# Patient Record
Sex: Female | Born: 1940 | Race: White | Hispanic: No | Marital: Married | Smoking: Former smoker
Health system: Southern US, Community
[De-identification: ages and names within clinical notes are randomized; demographics above are authoritative.]

## PROBLEM LIST (undated history)

## (undated) DIAGNOSIS — F419 Anxiety disorder, unspecified: Secondary | ICD-10-CM

## (undated) DIAGNOSIS — E042 Nontoxic multinodular goiter: Secondary | ICD-10-CM

## (undated) DIAGNOSIS — J45909 Unspecified asthma, uncomplicated: Secondary | ICD-10-CM

## (undated) DIAGNOSIS — R519 Headache, unspecified: Secondary | ICD-10-CM

## (undated) DIAGNOSIS — M1990 Unspecified osteoarthritis, unspecified site: Secondary | ICD-10-CM

## (undated) DIAGNOSIS — F329 Major depressive disorder, single episode, unspecified: Secondary | ICD-10-CM

## (undated) DIAGNOSIS — K639 Disease of intestine, unspecified: Secondary | ICD-10-CM

## (undated) DIAGNOSIS — I714 Abdominal aortic aneurysm, without rupture, unspecified (CMS HCC): Secondary | ICD-10-CM

## (undated) DIAGNOSIS — I728 Aneurysm of other specified arteries: Secondary | ICD-10-CM

## (undated) DIAGNOSIS — M797 Fibromyalgia: Secondary | ICD-10-CM

## (undated) DIAGNOSIS — R150 Incomplete defecation: Secondary | ICD-10-CM

## (undated) DIAGNOSIS — K5900 Constipation, unspecified: Secondary | ICD-10-CM

## (undated) DIAGNOSIS — F32A Depression, unspecified: Secondary | ICD-10-CM

## (undated) DIAGNOSIS — N816 Rectocele: Secondary | ICD-10-CM

## (undated) DIAGNOSIS — M549 Dorsalgia, unspecified: Secondary | ICD-10-CM

## (undated) HISTORY — DX: Anxiety disorder, unspecified: F41.9

## (undated) HISTORY — DX: Nontoxic multinodular goiter: E04.2

## (undated) HISTORY — DX: Constipation, unspecified: K59.00

## (undated) HISTORY — DX: Dorsalgia, unspecified: M54.9

## (undated) HISTORY — DX: Aneurysm of other specified arteries (CMS HCC): I72.8

## (undated) HISTORY — DX: Abdominal aortic aneurysm, without rupture, unspecified (CMS HCC): I71.40

## (undated) HISTORY — DX: Fibromyalgia: M79.7

## (undated) HISTORY — DX: Disease of intestine, unspecified: K63.9

## (undated) HISTORY — PX: HX WISDOM TEETH EXTRACTION: SHX21

## (undated) HISTORY — PX: HX COLONOSCOPY: 2100001147

## (undated) HISTORY — DX: Headache, unspecified: R51.9

## (undated) HISTORY — PX: HX GALL BLADDER SURGERY/CHOLE: SHX55

## (undated) HISTORY — DX: Depression, unspecified: F32.A

## (undated) HISTORY — PX: HX APPENDECTOMY: SHX54

## (undated) HISTORY — DX: Incomplete defecation: R15.0

## (undated) HISTORY — DX: Abdominal aortic aneurysm, without rupture (CMS HCC): I71.4

## (undated) HISTORY — DX: Rectocele: N81.6

## (undated) HISTORY — DX: Unspecified osteoarthritis, unspecified site: M19.90

---
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
## 2006-10-11 ENCOUNTER — Ambulatory Visit (HOSPITAL_COMMUNITY): Payer: Self-pay

## 2010-02-01 HISTORY — PX: HX LAP CHOLECYSTECTOMY: SHX56

## 2012-02-02 HISTORY — PX: HX SINUS SURGERY: 2100001108

## 2013-07-25 ENCOUNTER — Ambulatory Visit (INDEPENDENT_AMBULATORY_CARE_PROVIDER_SITE_OTHER): Payer: Self-pay | Admitting: Physician Assistant

## 2013-07-25 ENCOUNTER — Ambulatory Visit (INDEPENDENT_AMBULATORY_CARE_PROVIDER_SITE_OTHER): Payer: HMO | Admitting: Physician Assistant

## 2013-07-25 ENCOUNTER — Encounter (INDEPENDENT_AMBULATORY_CARE_PROVIDER_SITE_OTHER): Payer: Self-pay | Admitting: Physician Assistant

## 2013-07-25 VITALS — BP 120/70 | HR 81 | Temp 98.2°F | Resp 16 | Ht 61.0 in | Wt 140.0 lb

## 2013-07-25 DIAGNOSIS — M25559 Pain in unspecified hip: Secondary | ICD-10-CM

## 2013-07-25 DIAGNOSIS — M533 Sacrococcygeal disorders, not elsewhere classified: Secondary | ICD-10-CM | POA: Insufficient documentation

## 2013-07-25 NOTE — Patient Instructions (Signed)

## 2013-07-25 NOTE — H&P (Addendum)
HISTORY OF PRESENT ILLNESS:  This is a 73 y.o.  right handed female presenting with 6 mos history of right hip pain. No low back pain or leg pain.  No left hip pain.  Pain is described as sharp .  Symptoms are present off and on and seem worse from sitting to standing.  They improve with sitting and lying down.  Patient does not have bowel incontinence. She does not have bladder incontinence.  Patient has tried physical therapy.  Patient has not tried pain clinic injections.  Patient has not tried chiropractic treatment.  She  has not had a prior spine fracture.  She has not had prior spine surgery. She had a recent lumbar MRI study but did not have with her today.    PAST MEDICAL HISTORY:  has a past medical history of Fibromyalgia and Chronic headaches.  MEDICATIONS:  Current Outpatient Prescriptions   Medication Sig    Ca-D3-mag ox-zinc-cop-mang-bor (CALCIUM 600+D3 PLUS) 600 mg calcium- 800 unit-50 mg Oral Tablet Take by mouth    citalopram (CELEXA) 20 mg Oral Tablet Take 20 mg by mouth Once a day    Meloxicam (MOBIC) 15 mg Oral Tablet Take 15 mg by mouth Once a day       ALLERGIES:  has no allergies on file.    PAST SURGICAL HISTORY:   has past surgical history that includes sinus surgery (2014) and lap cholecystectomy (2012).      FAMILY HISTORY:  family history includes Heart Attack (age of onset: 2658) in her sister; Heart Attack (age of onset: 4368) in her father; Hypertension (age of onset: 2266) in her mother.    SOCIAL HISTORY:  reports that she has never smoked. She does not have any smokeless tobacco history on file. She reports that she does not drink alcohol or use illicit drugs.    REVIEW OF SYSTEMS:  Constitutional: negative, Eyes: negative, Ears, nose, mouth, throat, and face: negative, Respiratory: negative, Gastrointestinal: negative, Musculoskeletal:negative except for HPI, Neurological: negative except for HPI and Behavioral/Psych: negative except for HPI    PHYSICAL EXAMINATION:  On examination  today, the patient is in no apparent distress.  BP 120/70    Pulse 81    Temp(Src) 36.8 C (98.2 F) (Tympanic)    Resp 16    Ht 1.549 m (5\' 1" )    Wt 63.504 kg (140 lb)    BMI 26.47 kg/m2      SpO2 98%   Skin is warm and pink. Gait is steady without ataxia.  Motor, sensory, and cerebellar functions are intact.  No long tract findings.  Alert and oriented.  Language clear, coherent, and goal directed.  Cranial nerves 2-12 are intact. SLR is negative. Stiffness and point tenderness across the right hip.    ASSESSMENT:    Patient Active Problem List   Diagnosis    Hip pain    Sacroiliac pain       PLAN:  Right SI injection.   RTC to evaluate.   Repeat vs LESI if indicated after reviewing her recent L MRI on her next visit.   She was advised to bring her most recent L MRi on her next visit.       The patient was seen independently.  Arloa KohRolando Garcia, PA-C 07/25/2013, 12:47  [place] Department of Neurosurgery

## 2013-08-15 ENCOUNTER — Ambulatory Visit (HOSPITAL_BASED_OUTPATIENT_CLINIC_OR_DEPARTMENT_OTHER): Payer: HMO | Admitting: Anesthesiology

## 2013-08-15 DIAGNOSIS — M533 Sacrococcygeal disorders, not elsewhere classified: Secondary | ICD-10-CM

## 2013-09-05 ENCOUNTER — Ambulatory Visit (INDEPENDENT_AMBULATORY_CARE_PROVIDER_SITE_OTHER): Payer: HMO | Admitting: Physician Assistant

## 2013-09-05 ENCOUNTER — Encounter (INDEPENDENT_AMBULATORY_CARE_PROVIDER_SITE_OTHER): Payer: Self-pay | Admitting: Physician Assistant

## 2013-09-05 VITALS — BP 118/60 | HR 80 | Temp 98.0°F | Resp 16 | Ht 61.0 in | Wt 140.0 lb

## 2013-09-05 DIAGNOSIS — M25559 Pain in unspecified hip: Secondary | ICD-10-CM

## 2013-09-05 DIAGNOSIS — M25551 Pain in right hip: Secondary | ICD-10-CM

## 2013-09-05 DIAGNOSIS — M533 Sacrococcygeal disorders, not elsewhere classified: Secondary | ICD-10-CM

## 2013-09-05 NOTE — Patient Instructions (Signed)

## 2013-09-05 NOTE — Progress Notes (Addendum)
S- This is a 73 y.o. year old female with history of right sided low back and right hip pain . 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 She underwent a Right SI injection. . She claims 0% relief to date. She did have a burning sensation on her face and jaw after the injection, lasting 3 days. The patient denies any myelopathy. The patient denies any complications on the injection site. The patient denies any bladder or bowel changes. The patient denies any fevers.  O- On examination, She  is no apparent distress. BP 118/60 mmHg   Pulse 80   Temp(Src) 36.7 C (98 F) (Tympanic)   Resp 16   Ht 1.549 m (5\' 1" )   Wt 63.504 kg (140 lb)   BMI 26.47 kg/m2   SpO2 97%. Injection site is intact, no ereythema, swelling, drainage, induration, warmth or signs of infection. Skin is warm and pink. Gait is steady, no ataxia. Motor and sensory funtions are intact. DTR's are intact and symmetric. Cranial Nerve 2-12 are intact to specific testings. SLR is negative bilaterally. Point tenderness to right hip area.  Assessment:   Patient Active Problem List   Diagnosis    Hip pain    Sacroiliac pain     Plan: 1. L5-S1 LESI (aiming right).  RTC to evaluate.  2. Walking exercises as tolerated  3. PT as needed    The patient was seen independently.  Arloa KohRolando Garcia, PA-C 09/05/2013, 11:30  [facility] Department of Neurosurgery   and Pain Management

## 2013-09-26 ENCOUNTER — Ambulatory Visit (HOSPITAL_BASED_OUTPATIENT_CLINIC_OR_DEPARTMENT_OTHER): Payer: HMO | Admitting: Anesthesiology

## 2013-09-26 DIAGNOSIS — M533 Sacrococcygeal disorders, not elsewhere classified: Secondary | ICD-10-CM

## 2013-09-26 DIAGNOSIS — IMO0001 Reserved for inherently not codable concepts without codable children: Secondary | ICD-10-CM

## 2013-09-26 DIAGNOSIS — M5137 Other intervertebral disc degeneration, lumbosacral region: Secondary | ICD-10-CM

## 2013-10-23 ENCOUNTER — Encounter (INDEPENDENT_AMBULATORY_CARE_PROVIDER_SITE_OTHER): Payer: Self-pay | Admitting: Physician Assistant

## 2015-11-27 ENCOUNTER — Other Ambulatory Visit: Payer: Self-pay | Admitting: Internal Medicine

## 2015-11-27 ENCOUNTER — Ambulatory Visit
Admission: RE | Admit: 2015-11-27 | Discharge: 2015-11-27 | Disposition: A | Discharge status: Home / Self Care | Payer: Self-pay | Source: Ambulatory Visit | Attending: Internal Medicine | Admitting: Internal Medicine

## 2015-11-27 DIAGNOSIS — E041 Nontoxic single thyroid nodule: Secondary | ICD-10-CM

## 2015-12-09 ENCOUNTER — Other Ambulatory Visit (HOSPITAL_COMMUNITY)
Admission: RE | Admit: 2015-12-09 | Discharge: 2015-12-09 | Disposition: A | Discharge status: Home / Self Care | Payer: Medicare Other | Source: Ambulatory Visit | Attending: General Surgery | Admitting: General Surgery

## 2015-12-09 ENCOUNTER — Ambulatory Visit
Admission: RE | Admit: 2015-12-09 | Discharge: 2015-12-09 | Disposition: A | Discharge status: Home / Self Care | Payer: Self-pay | Source: Ambulatory Visit | Attending: Internal Medicine | Admitting: Internal Medicine

## 2015-12-09 DIAGNOSIS — E041 Nontoxic single thyroid nodule: Secondary | ICD-10-CM | POA: Insufficient documentation

## 2016-01-02 ENCOUNTER — Ambulatory Visit (INDEPENDENT_AMBULATORY_CARE_PROVIDER_SITE_OTHER): Payer: Medicare Other | Admitting: Endocrinology

## 2016-01-02 ENCOUNTER — Encounter: Payer: Self-pay | Admitting: Endocrinology

## 2016-01-02 DIAGNOSIS — E042 Nontoxic multinodular goiter: Secondary | ICD-10-CM | POA: Insufficient documentation

## 2016-01-02 DIAGNOSIS — F419 Anxiety disorder, unspecified: Secondary | ICD-10-CM | POA: Diagnosis not present

## 2016-01-02 LAB — T4, FREE: FREE T4: 0.93 ng/dL (ref 0.60–1.60)

## 2016-01-02 LAB — TSH: TSH: 1.12 u[IU]/mL (ref 0.35–4.50)

## 2016-01-02 NOTE — Progress Notes (Signed)
Subjective:    Patient ID: Erin Perry, female    DOB: 01/13/1941, 75 y.o.   MRN: [id number]  HPI Pt is referred by Dr Ebbie LatusSchoenoff, for nodular thyroid.  5 mos ago, pt had carotid US.  She was incidentally was noted to have nodules in the thyroid.  she is unaware of ever having had thyroid problems in the past.  she has no h/o XRT or surgery to the neck.  She has slightly easy bruising, and assoc weight gain.   Past Medical History:  Diagnosis Date  . Multinodular goiter     No past surgical history on file.  Social History   Social History  . Marital status: Married    Spouse name: N/A  . Number of children: N/A  . Years of education: N/A   Occupational History  . Not on file.   Social History Main Topics  . Smoking status: Former Games developermoker  . Smokeless tobacco: Never Used  . Alcohol use Yes  . Drug use: Unknown  . Sexual activity: Not on file   Other Topics Concern  . Not on file   Social History Narrative  . No narrative on file    No current outpatient prescriptions on file prior to visit.   No current facility-administered medications on file prior to visit.     Allergies  Allergen Reactions  . Ceftin  [Cefuroxime Axetil] Anaphylaxis  . Clarithromycin Anaphylaxis  . Talwin [Pentazocine] Anaphylaxis  . Amoxicillin   . Gabapentin   . Ibuprofen   . Iodinated Diagnostic Agents   . Naproxen   . Other     FLU SHOT   . Penicillins   . Sulfa Antibiotics   . Tequin [Gatifloxacin]   . Zoloft  [Sertraline Hcl]     Family History  Problem Relation Age of Onset  . Thyroid disease Neg Hx     BP 122/84   Pulse 86   Ht 5\' 5"  (1.651 m)   Wt 139 lb (63 kg)   SpO2 95%   BMI 23.13 kg/m    Review of Systems Denies hoarseness, neck pain, visual loss, chest pain, cough, dysphagia, diarrhea, itching, flushing, depression, cold intolerance, headache, numbness, and rhinorrhea.       Objective:   Physical Exam VS: see vs page GEN: no distress HEAD: head: no  deformity eyes: no periorbital swelling, no proptosis external nose and ears are normal mouth: no lesion seen NECK: supple, thyroid is not enlarged.  The nodules are not palpable.  CHEST WALL: no deformity LUNGS: clear to auscultation CV: reg rate and rhythm, no murmur ABD: abdomen is soft, nontender.  no hepatosplenomegaly.  not distended.  no hernia MUSCULOSKELETAL: muscle bulk and strength are grossly normal.  no obvious joint swelling.  gait is normal and steady EXTEMITIES: no deformity.  no ulcer on the feet.  feet are of normal color and temp.  no edema PULSES: dorsalis pedis intact bilat.  no carotid bruit NEURO:  cn 2-12 grossly intact.   readily moves all 4's.  sensation is intact to touch on the feet SKIN:  Normal texture and temperature.  No rash or suspicious lesion is visible.   NODES:  None palpable at the neck. PSYCH: alert, well-oriented.  Does not appear anxious nor depressed.  US: Thyroid ultrasound. Right upper pole complex nodule measures 1.5 x 1.1 x 1.3 cm. Right lower pole cystic nodule measures 0.5 x 0.3 x 1.3 cm. Left lower pole hypoechoic solid nodule measures 1.1 x 2.5  x 0.6 cm.  outside test results are reviewed: TSH=1.1  Cytol: BENIGN FOLLICULAR NODULE (BETHESDA CATEGORY II).  I have reviewed outside records, and summarized: Pt was noted to have goiter, and ref here.  She was noted at [facility] to have eye sxs, but no findings of Grave's Dz were found.     Assessment & Plan:  Multinodular goiter, ne to me. Euthyroid.   Weight gain: I told pt this is not thyroid-related  No rx needed now. Please come back for a follow-up appointment in 6-12 months

## 2016-01-02 NOTE — Patient Instructions (Signed)
blood tests are requested for you today.  We'll let you know about the results. Please come back for a follow-up appointment in 6-12 months, when we'll plan to recheck the ultrasound.

## 2016-01-03 ENCOUNTER — Encounter: Payer: Self-pay | Admitting: Endocrinology

## 2016-01-03 DIAGNOSIS — F419 Anxiety disorder, unspecified: Secondary | ICD-10-CM | POA: Insufficient documentation

## 2016-01-05 ENCOUNTER — Telehealth: Payer: Self-pay | Admitting: Endocrinology

## 2016-01-05 NOTE — Telephone Encounter (Signed)
Patient is calling for the result of labs °

## 2016-01-06 NOTE — Telephone Encounter (Signed)
I contacted the patient and advised via voicemail results from 01/02/2016 were normal.  Patient advised to call back to discuss further if needed.

## 2016-07-02 ENCOUNTER — Ambulatory Visit (INDEPENDENT_AMBULATORY_CARE_PROVIDER_SITE_OTHER): Payer: Medicare Other | Admitting: Endocrinology

## 2016-07-02 ENCOUNTER — Encounter: Payer: Self-pay | Admitting: Endocrinology

## 2016-07-02 ENCOUNTER — Other Ambulatory Visit (INDEPENDENT_AMBULATORY_CARE_PROVIDER_SITE_OTHER): Payer: Medicare Other

## 2016-07-02 VITALS — BP 130/78 | HR 74 | Ht 65.0 in | Wt 139.0 lb

## 2016-07-02 DIAGNOSIS — E042 Nontoxic multinodular goiter: Secondary | ICD-10-CM

## 2016-07-02 LAB — T4, FREE: Free T4: 0.94 ng/dL (ref 0.60–1.60)

## 2016-07-02 LAB — T3, FREE: T3, Free: 2.9 pg/mL (ref 2.3–4.2)

## 2016-07-02 LAB — TSH: TSH: 1.14 u[IU]/mL (ref 0.35–4.50)

## 2016-07-02 NOTE — Patient Instructions (Addendum)
Let's recheck the ultrasound.  you will receive a phone call, about a day and time for an appointment. Thyroid blood tests are requested for you today.  We'll let you know about the results. Please return in 1 year.

## 2016-07-02 NOTE — Progress Notes (Signed)
   Subjective:    Patient ID: Erin Perry, female    DOB: 11/01/1940, 76 y.o.   MRN: [id number]  HPI Pt returns for f/u of multinodular goiter (dx'ed 2017, on carotid US; bx in 2017 showed BENIGN FOLLICULAR NODULE, CATEGORY II; she is euthyroid).  pt states she feels well in general, except for a few lbs of weight gain.  She does not notice the goiter Past Medical History:  Diagnosis Date  . Anxiety   . Multinodular goiter     No past surgical history on file.  Social History   Social History  . Marital status: Married    Spouse name: N/A  . Number of children: N/A  . Years of education: N/A   Occupational History  . Not on file.   Social History Main Topics  . Smoking status: Former Games developermoker  . Smokeless tobacco: Never Used  . Alcohol use Yes  . Drug use: Unknown  . Sexual activity: Not on file   Other Topics Concern  . Not on file   Social History Narrative  . No narrative on file    Current Outpatient Prescriptions on File Prior to Visit  Medication Sig Dispense Refill  . Multiple Vitamins-Minerals (MULTIVITAMIN ADULT PO) Take by mouth.    . busPIRone (BUSPAR) 5 MG tablet TAKE 1 TABLET(5 MG) BY MOUTH TWICE DAILY     No current facility-administered medications on file prior to visit.     Allergies  Allergen Reactions  . Ceftin  [Cefuroxime Axetil] Anaphylaxis  . Clarithromycin Anaphylaxis  . Talwin [Pentazocine] Anaphylaxis  . Amoxicillin   . Gabapentin   . Ibuprofen   . Iodinated Diagnostic Agents   . Naproxen   . Other     FLU SHOT   . Penicillins   . Sulfa Antibiotics   . Tequin [Gatifloxacin]   . Zoloft  [Sertraline Hcl]     Family History  Problem Relation Age of Onset  . Thyroid disease Neg Hx     BP 130/78   Pulse 74   Ht 5\' 5"  (1.651 m)   Wt 139 lb (63 kg)   SpO2 93%   BMI 23.13 kg/m   Review of Systems Denies neck pain    Objective:   Physical Exam VITAL SIGNS:  See vs page GENERAL: no distress NECK: supple, thyroid is not  enlarged.  The nodules are not palpable.       Assessment & Plan:  Multinodular goiter, non-palpable.  Due for recheck.    Patient Instructions  Let's recheck the ultrasound.  you will receive a phone call, about a day and time for an appointment. Thyroid blood tests are requested for you today.  We'll let you know about the results. Please return in 1 year.

## 2016-07-16 ENCOUNTER — Telehealth: Payer: Self-pay | Admitting: Endocrinology

## 2016-07-16 NOTE — Telephone Encounter (Signed)
Yes please, Thanks!  

## 2016-07-16 NOTE — Telephone Encounter (Signed)
So I need to [facility] and fax order to [facility], correct?

## 2016-07-16 NOTE — Telephone Encounter (Signed)
Got it, thank you!!

## 2016-07-16 NOTE — Telephone Encounter (Signed)
Please change the current US request to [facility].

## 2016-07-16 NOTE — Telephone Encounter (Signed)
Latisha with [facility] called and stated that there was a note on pt's US order that stated once US had been scheduled to notify Dr. Everardo AllEllison so that he can request her previous US thyroid from [facility]. Pt has been scheduled for 6/25 @ 2:30 PM. Debbe OdeaLatisha said you can call her if need be, thanks!

## 2016-07-16 NOTE — Telephone Encounter (Signed)
See message to be advised.  

## 2016-07-19 NOTE — Telephone Encounter (Signed)
Appt for US with GI has been canceled, order has been faxed to [facility], they will contact pt to schedule

## 2016-07-26 ENCOUNTER — Other Ambulatory Visit: Payer: Medicare Other

## 2016-08-10 ENCOUNTER — Telehealth: Payer: Self-pay | Admitting: Endocrinology

## 2016-08-10 NOTE — Telephone Encounter (Signed)
Patient notified via husband labs have been mailed.

## 2016-08-10 NOTE — Telephone Encounter (Signed)
Patient is requesting to have her lab results mailed to her address on file. The labs she had done 07/02/16. Notify patient once this has been done.

## 2016-08-13 NOTE — Telephone Encounter (Signed)
Letter printed, pending patient's pick up.

## 2016-08-13 NOTE — Telephone Encounter (Signed)
Patient called upset that she has not received her labs through the mail. I advised the patient that her husband was notified by Aundra MilletMegan B on 7/10 that the office has mailed it. I also told the patient that just like regular mail someone may put in the mailbox, once Endocrinology mails it, it is the postal service who is then in control of that mail. I ensured the patient that Endocrinology did their part to mail it to her th 10th. Patient stated she would come on Monday to pick it up.   Please go ahead and print the labs she is requesting and have ready to pick up for Monday in the front office.

## 2016-12-24 IMAGING — US US THYROID BIOPSY
1 series · 13 of 15 positions shown · non-contrast
Comparison: Ultrasound from outside facility that was uploaded on
November 27, 2015

INDICATION: Right thyroid nodule found on ultrasound from an outside facility.
Request is made for a thyroid biopsy.

EXAM:
ULTRASOUND GUIDED NEEDLE ASPIRATE BIOPSY OF THE RIGHT THYROID GLAND

[Series 1: us thyroid biopsy · 0.06mm/px · 15 acquisitions, 13 frames shown]
[im 1/15]
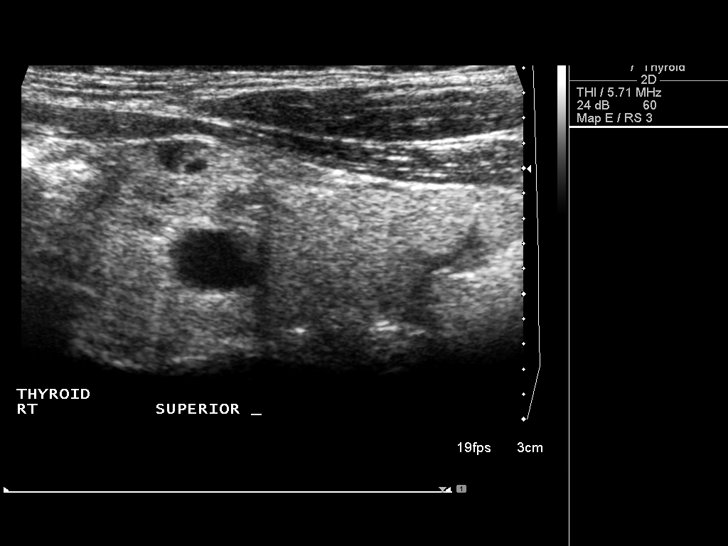
[im 2/15]
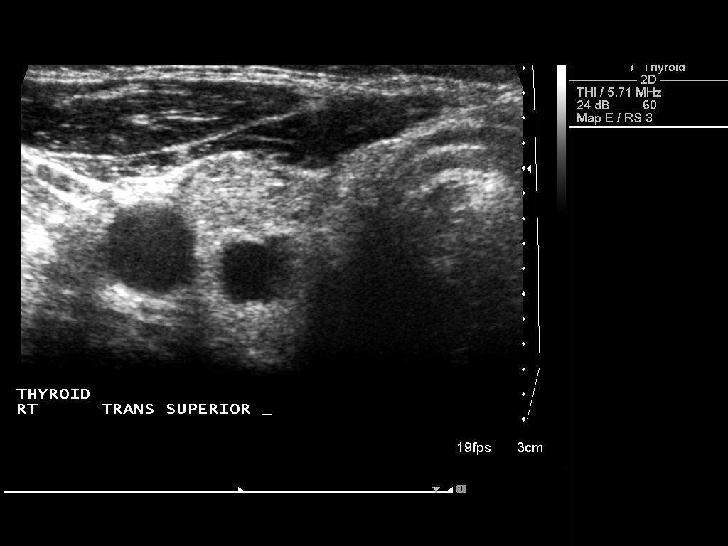
[im 3/15]
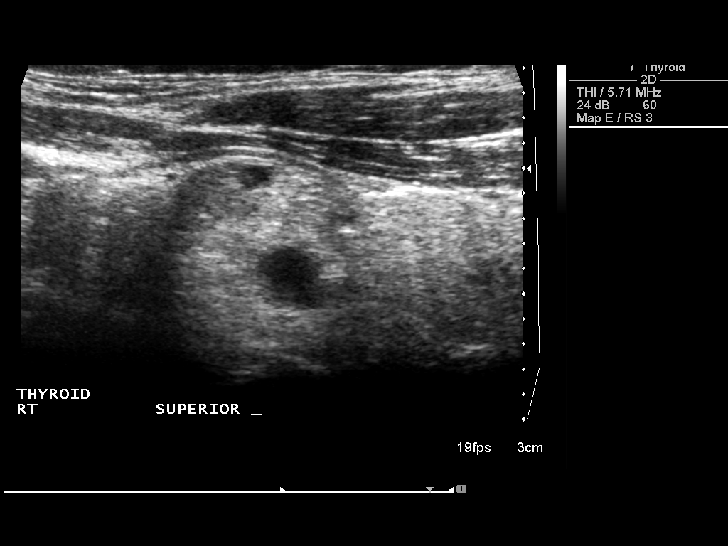
[im 5/15]
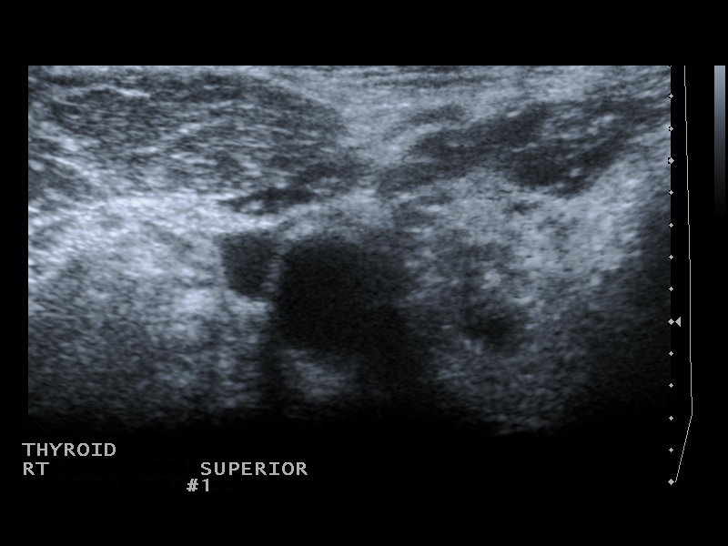
[im 6/15]
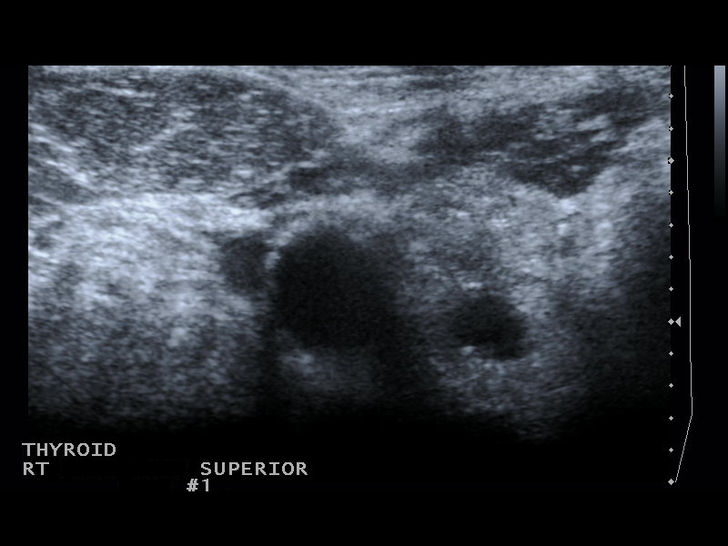
[im 7/15]
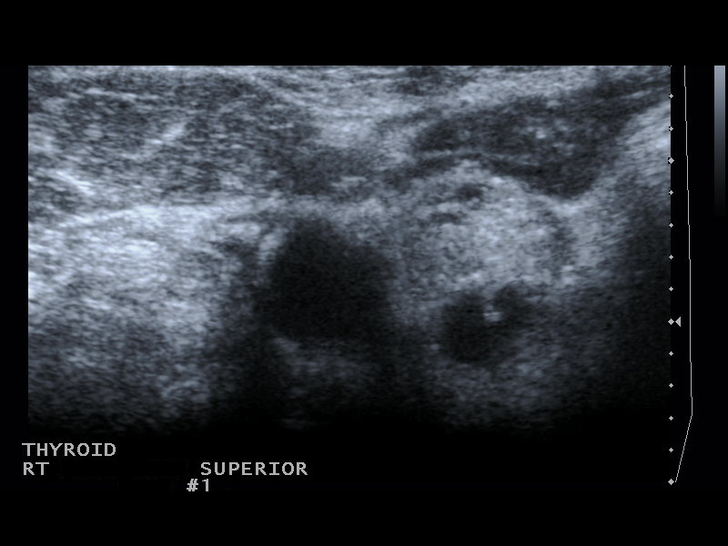
[im 8/15]
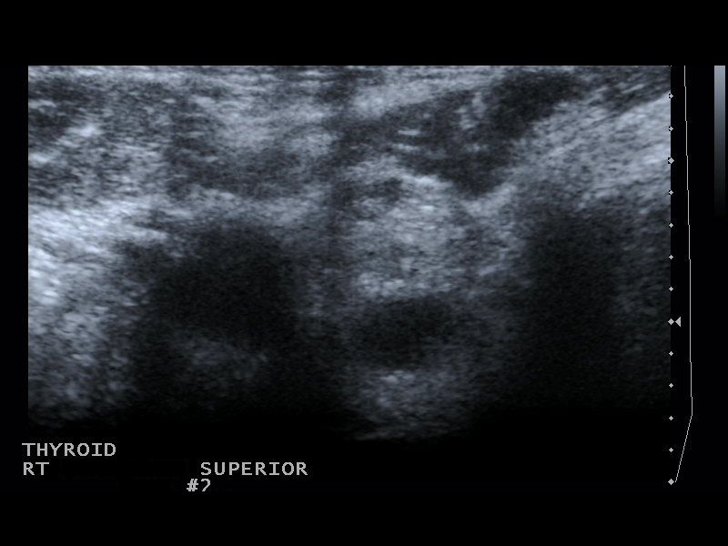
[im 9/15]
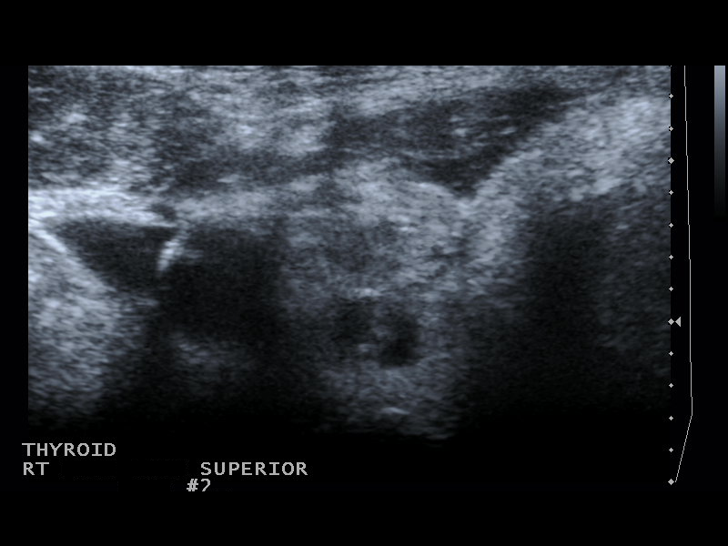
[im 10/15]
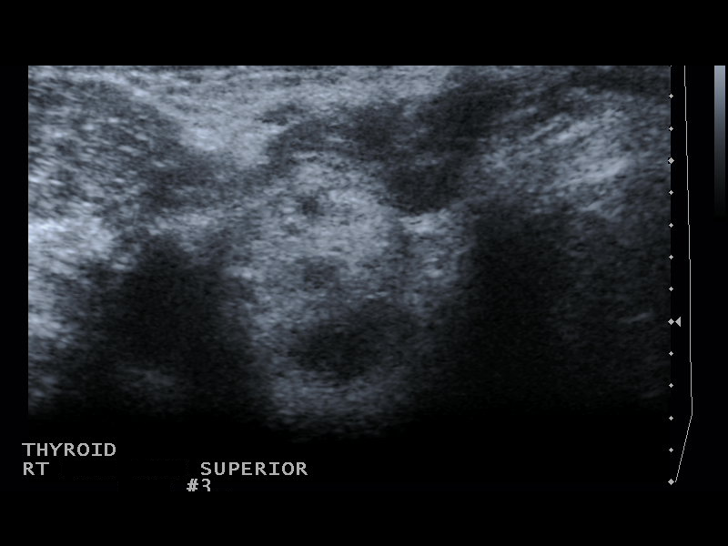
[im 11/15]
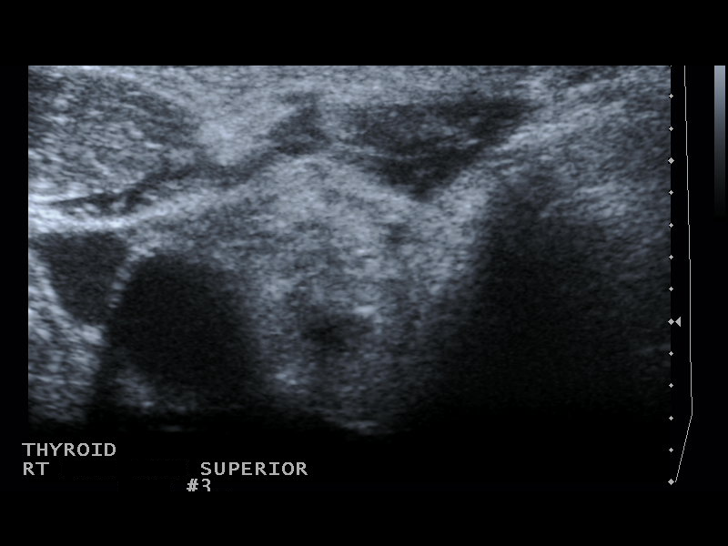
[im 13/15]
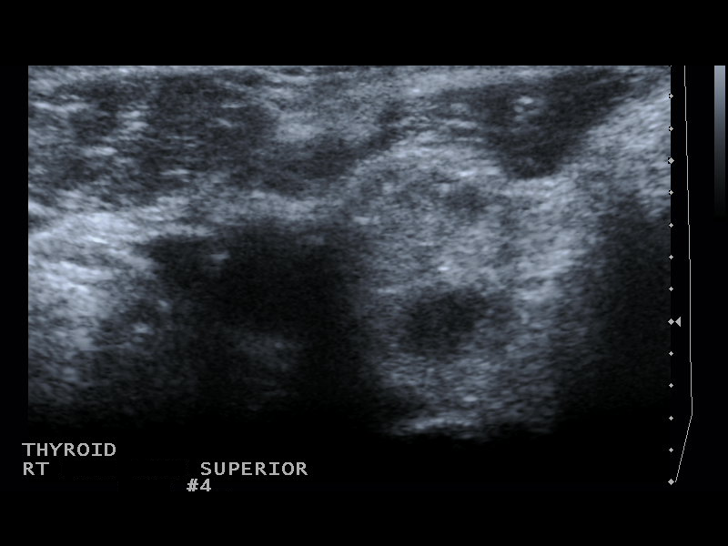
[im 14/15]
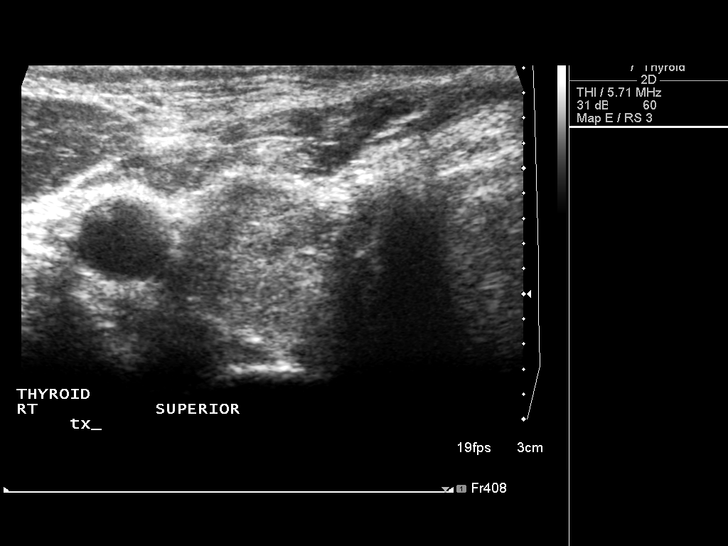
[im 15/15]
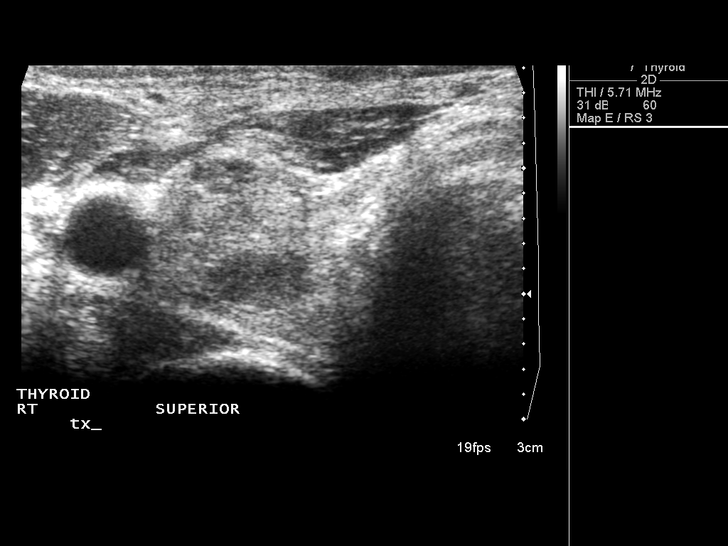

[13 of 15 positions shown; findings below may reference images not displayed]

MEDICATIONS:
1% lidocaine

COMPLICATIONS:
None immediate.

PROCEDURE:
Informed written consent was obtained from the patient after a
thorough discussion of the procedural risks, benefits and
alternatives. All questions were addressed. Maximal Sterile Barrier
Technique was utilized including caps, mask, sterile gowns, sterile
gloves, sterile drape, hand hygiene and skin antiseptic. A timeout
was performed prior to the initiation of the procedure.

Ultrasound was performed to localize and mark an adequate site for
the biopsy. The patient was then prepped and draped in a normal
sterile fashion. Local anesthesia was provided with 1% lidocaine.
Using direct ultrasound guidance, 4 passes were made using needles
into the nodule within the right lobe of the thyroid. Ultrasound was
used to confirm needle placements on all occasions. Specimens were
sent to Pathology for analysis.
IMPRESSION: Ultrasound guided needle aspirate biopsy performed of the right
thyroid nodule.

## 2017-07-04 ENCOUNTER — Ambulatory Visit: Payer: Medicare Other | Admitting: Endocrinology

## 2017-12-12 ENCOUNTER — Other Ambulatory Visit (HOSPITAL_BASED_OUTPATIENT_CLINIC_OR_DEPARTMENT_OTHER): Payer: Self-pay | Admitting: Physician Assistant

## 2017-12-12 MED ORDER — MELOXICAM 15 MG TABLET
15.0000 mg | ORAL_TABLET | Freq: Every day | ORAL | 5 refills | Status: DC
Start: 2017-12-12 — End: 2018-01-17

## 2017-12-21 ENCOUNTER — Encounter (HOSPITAL_BASED_OUTPATIENT_CLINIC_OR_DEPARTMENT_OTHER): Payer: Self-pay | Admitting: Physician Assistant

## 2017-12-21 ENCOUNTER — Ambulatory Visit (HOSPITAL_BASED_OUTPATIENT_CLINIC_OR_DEPARTMENT_OTHER): Payer: HMO | Admitting: Physician Assistant

## 2017-12-21 VITALS — BP 132/80 | HR 78 | Temp 98.0°F | Resp 12 | Ht 61.0 in | Wt 143.0 lb

## 2017-12-21 DIAGNOSIS — F324 Major depressive disorder, single episode, in partial remission: Secondary | ICD-10-CM

## 2017-12-21 DIAGNOSIS — F410 Panic disorder [episodic paroxysmal anxiety] without agoraphobia: Secondary | ICD-10-CM

## 2017-12-21 DIAGNOSIS — M797 Fibromyalgia: Secondary | ICD-10-CM

## 2017-12-21 DIAGNOSIS — M25551 Pain in right hip: Secondary | ICD-10-CM

## 2017-12-21 DIAGNOSIS — M25531 Pain in right wrist: Secondary | ICD-10-CM

## 2017-12-21 DIAGNOSIS — M25532 Pain in left wrist: Secondary | ICD-10-CM

## 2017-12-21 DIAGNOSIS — H6120 Impacted cerumen, unspecified ear: Secondary | ICD-10-CM

## 2017-12-21 DIAGNOSIS — F411 Generalized anxiety disorder: Secondary | ICD-10-CM

## 2017-12-21 MED ORDER — DEXAMETHASONE SODIUM PHOSPHATE 4 MG/ML INJECTION SOLUTION
4.0000 mg | Freq: Once | INTRAMUSCULAR | 0 refills | Status: AC
Start: 2017-12-21 — End: 2017-12-21

## 2017-12-21 MED ORDER — KETOROLAC 30 MG/ML (1 ML) INJECTION SOLUTION
30.0000 mg | Freq: Once | INTRAMUSCULAR | 0 refills | Status: AC
Start: 2017-12-21 — End: 2017-12-21

## 2017-12-21 MED ORDER — CITALOPRAM 40 MG TABLET
40.0000 mg | ORAL_TABLET | Freq: Every day | ORAL | 1 refills | Status: DC
Start: 2017-12-21 — End: 2018-01-17

## 2017-12-21 MED ORDER — ALPRAZOLAM 0.5 MG TABLET
0.50 mg | ORAL_TABLET | Freq: Three times a day (TID) | ORAL | 0 refills | Status: DC | PRN
Start: 2017-12-21 — End: 2018-09-05

## 2017-12-21 NOTE — Progress Notes (Signed)
FAMILY MEDICINE, PROFESSIONAL BUILDING  [address]  [place] [id number]  [facility] by [facility]  Progress Note    Name: Elizabeth Hunter MRN:  [id number]   Date: 12/21/2017 Age: 77 y.o.         Reason for Visit: Wrist Pain (bilateral wrist pain/wonders if she has carpal tunnel/does a lot of painting and crafts with hands)    History of Present Illness  Elizabeth Overliedith Nodine is a 77 y.o. female who is being seen today to re-est as patient. Pt also c/o b/l wrist pain. Denies any specific injury. 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 And working a lot with her hands    Pt also c/o possible wax impaction in b/l ears.     Pt also states she is having increased stressors in her life with family  Pt request that celexa possible increase to get thru the next few months and holidays  Currently is taking 20mg  daily    Pt also request refill of xanax. Only takes as needed has had current rx for 1 year.    Past Medical History:   Diagnosis Date   . Bowel trouble     obstruction/nonsurgical   . Chronic headaches    . Depression    . Fibromyalgia    . Osteoarthritis          Past Surgical History:   Procedure Laterality Date   . HX CESAREAN SECTION      2   . HX CHOLECYSTECTOMY     . HX LAP CHOLECYSTECTOMY  2012   . HX SINUS SURGERY  2014         Current Outpatient Medications   Medication Sig   . ALPRAZolam (XANAX) 0.5 mg Oral Tablet Take 1 Tab (0.5 mg total) by mouth Three times a day as needed for Insomnia   . Ca-D3-mag ox-zinc-cop-mang-bor (CALCIUM 600+D3 PLUS) 600 mg calcium- 800 unit-50 mg Oral Tablet Take by mouth   . citalopram (CELEXA) 40 mg Oral Tablet Take 1 Tab (40 mg total) by mouth Once a day   . dexamethasone (DECADRON) 4 mg/mL Injection Solution 1 mL (4 mg total) by Intravenous route One time for 1 dose   . docusate sodium (STOOL SOFTENER) 100 mg Oral Capsule Take 100 mg by mouth Once a day   . Herbal Drugs (COLON HERBAL CLEANSER) Oral Capsule Take by mouth   . ketorolac  (TORADOL) 30 mg/mL (1 mL) Injection Solution 1 mL (30 mg total) by Intramuscular route One time for 1 dose   . Lactobacillus acidophilus (PROBIOTIC ACIDOPHILUS) 1.5 mg (250 million cell) Oral Capsule Take by mouth   . meloxicam (MOBIC) 15 mg Oral Tablet Take 1 Tab (15 mg total) by mouth Once a day     No Known Allergies  Family Medical History:     Problem Relation (Age of Onset)    Heart Attack Father 26(68), Sister 61(58)    Hypertension (High Blood Pressure) Mother 71(66)            Social History     Tobacco Use   . Smoking status: Never Smoker   . Smokeless tobacco: Never Used   Substance Use Topics   . Alcohol use: No   . Drug use: No       Nursing Notes  There are no exam notes on file for this visit.     Review of Systems  Review of Systems   Constitutional:  Negative for chills, fever, malaise/fatigue and weight loss.   HENT: Positive for hearing loss. Negative for congestion, ear discharge, ear pain, nosebleeds, sinus pain and sore throat.         Feels like ears are clogged   Eyes: Negative for blurred vision, photophobia and discharge.   Respiratory: Negative for cough, hemoptysis, sputum production, shortness of breath and wheezing.    Cardiovascular: Negative for chest pain, palpitations and leg swelling.   Gastrointestinal: Negative for abdominal pain, blood in stool, constipation, diarrhea, heartburn, nausea and vomiting.   Genitourinary: Negative for dysuria, frequency, hematuria and urgency.   Musculoskeletal: Positive for back pain, joint pain and neck pain. Negative for falls and myalgias.   Skin: Negative for itching and rash.   Neurological: Negative for dizziness, tingling, tremors, weakness and headaches.   Endo/Heme/Allergies: Negative for environmental allergies. Does not bruise/bleed easily.   Psychiatric/Behavioral: Positive for depression. Negative for memory loss, substance abuse and suicidal ideas. The patient is nervous/anxious. The patient does not have insomnia.        Physical Exam:  BP  132/80 (Site: Left, Patient Position: Sitting, Cuff Size: Adult Large)   Pulse 78   Temp 36.7 C (98 F) (Tympanic)   Resp 12   Ht 1.549 m (5\' 1" )   Wt 64.9 kg (143 lb)   BMI 27.02 kg/m       Physical Exam   Constitutional: She is oriented to person, place, and time and well-developed, well-nourished, and in no distress. Vital signs are normal. No distress.   HENT:   Head: Normocephalic and atraumatic.   Eyes: Pupils are equal, round, and reactive to light. Conjunctivae and EOM are normal.   Cardiovascular: Normal rate, regular rhythm and normal heart sounds.   Pulmonary/Chest: Effort normal and breath sounds normal.   Abdominal: Soft. Bowel sounds are normal.   Musculoskeletal: Normal range of motion.      Right wrist: She exhibits tenderness and swelling. She exhibits normal range of motion.   Neurological: She is alert and oriented to person, place, and time. Gait normal. Coordination normal.   Skin: Skin is warm and dry. She is not diaphoretic.   Psychiatric: Mood, memory, affect and judgment normal.   Nursing note and vitals reviewed.      Assessment and Plan    ENCOUNTER DIAGNOSES     ICD-10-CM   1. 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 Impacted cerumen, unspecified laterality H61.20   3. Pain in both wrists M25.531    M25.532   4. 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 Pain of right hip joint M25.551   6. Fibromyalgia M79.7      Increased celexa from 20mg  to 40mg  daily  Monitor the patient, explained we could see more side effects from medication at this higher dose, pt voices understanding     Refilled medication    Stay as active as possible  Continue with going to the gym    Rest  Ice/heat for b/l wrist  Will try the injections today to see if it helps with pain  Rest is needed  If no improvement will order xrays and additional work up      Follow up: Return in about 6 months (around 06/21/2018), or if symptoms  worsen or fail to improve.      This patient was seen independently.    Read Drivers, PA-C  12/21/2017, 12:12  I was personally available for consultation during this visit.  I have reviewed the chart and agree with the documentation as recorded by the APP, including the treatment plan and disposition.    Cecilie Kicks, DO  12/21/2017, 13:05  Electronically signed by Cecilie Kicks, DO

## 2017-12-21 NOTE — Nursing Note (Signed)
12/21/17 1300 12/21/17 1314   Medication Administration   Initials sjw sjw   Medication  Toradol Dexamethasone   Medication Dose 30mg  6ml   Route of Administration IM IM   Site Right Gluteus Left Gluteus   [facility] # [id number]   LOT # [id number]   Expiration date 09/01/19 04/01/19   [employer]   Clinic Supplied Yes Yes

## 2018-01-17 ENCOUNTER — Other Ambulatory Visit (HOSPITAL_BASED_OUTPATIENT_CLINIC_OR_DEPARTMENT_OTHER): Payer: Self-pay | Admitting: Physician Assistant

## 2018-01-17 MED ORDER — CITALOPRAM 40 MG TABLET
40.0000 mg | ORAL_TABLET | Freq: Every day | ORAL | 3 refills | Status: DC
Start: 2018-01-17 — End: 2019-02-05

## 2018-01-17 MED ORDER — MELOXICAM 15 MG TABLET
15.0000 mg | ORAL_TABLET | Freq: Every day | ORAL | 3 refills | Status: DC
Start: 2018-01-17 — End: 2018-11-17

## 2018-05-17 ENCOUNTER — Other Ambulatory Visit: Payer: Self-pay

## 2018-05-17 ENCOUNTER — Ambulatory Visit (INDEPENDENT_AMBULATORY_CARE_PROVIDER_SITE_OTHER): Payer: HMO | Admitting: Physician Assistant

## 2018-05-17 VITALS — BP 134/82 | HR 73 | Temp 98.0°F | Resp 12 | Ht 61.0 in | Wt 146.0 lb

## 2018-05-17 DIAGNOSIS — Z6827 Body mass index (BMI) 27.0-27.9, adult: Secondary | ICD-10-CM

## 2018-05-17 DIAGNOSIS — M25562 Pain in left knee: Secondary | ICD-10-CM

## 2018-05-17 NOTE — Progress Notes (Signed)
FAMILY MEDICINE, PROFESSIONAL BUILDING  [address]  [place] [address]  Operated by [facility]  Progress Note    Name: Elizabeth Hunter MRN:  [id number]   Date: 05/17/2018 Age: 78 y.o.         Reason for Visit: Leg Pain (Pt complains of tingling in both legs left worse than right)    History of Present Illness  Elizabeth Hunter is a 78 y.o. female who is being seen today for left knee pain. Pt states the pain started about 3 weeks ago. Pt states she did re-finish her dining room hardwood floor on her hands and knees. States she sanded/stained and finished the floor by herself. States she continued even though her knee bothered her.    Pt also states she has noticed some tightness and tingling in her legs also in about the past 10 days. Pt states goes away when she moves around or changes positions     Past Medical History:   Diagnosis Date   . Bowel trouble     obstruction/nonsurgical   . Chronic headaches    . Depression    . Fibromyalgia    . Osteoarthritis          Past Surgical History:   Procedure Laterality Date   . HX CESAREAN SECTION      2   . HX CHOLECYSTECTOMY     . HX LAP CHOLECYSTECTOMY  2012   . HX SINUS SURGERY  2014         Current Outpatient Medications   Medication Sig   . ALPRAZolam (XANAX) 0.5 mg Oral Tablet Take 1 Tab (0.5 mg total) by mouth Three times a day as needed for Insomnia   . Ca-D3-mag ox-zinc-cop-mang-bor (CALCIUM 600+D3 PLUS) 600 mg calcium- 800 unit-50 mg Oral Tablet Take by mouth   . citalopram (CELEXA) 40 mg Oral Tablet Take 1 Tab (40 mg total) by mouth Once a day   . docusate sodium (STOOL SOFTENER) 100 mg Oral Capsule Take 100 mg by mouth Once a day   . Herbal Drugs (COLON HERBAL CLEANSER) Oral Capsule Take by mouth   . Lactobacillus acidophilus (PROBIOTIC ACIDOPHILUS) 1.5 mg (250 million cell) Oral Capsule Take by mouth   . meloxicam (MOBIC) 15 mg Oral Tablet Take 1 Tab (15 mg total) by mouth Once a day     No Known Allergies  Family Medical History:     Problem  Relation (Age of Onset)    Heart Attack Father (72), Sister (50)    Hypertension (High Blood Pressure) Mother (26)            Social History     Tobacco Use   . Smoking status: Never Smoker   . Smokeless tobacco: Never Used   Substance Use Topics   . Alcohol use: No   . Drug use: No       Nursing Notes  There are no exam notes on file for this visit.     Review of Systems  Review of Systems   Constitutional: Negative for chills, fever, malaise/fatigue and weight loss.   HENT: Negative for congestion, ear discharge, ear pain, hearing loss, nosebleeds, sinus pain and sore throat.    Eyes: Negative for blurred vision, photophobia and discharge.   Respiratory: Negative for cough, hemoptysis, sputum production, shortness of breath and wheezing.    Cardiovascular: Negative for chest pain, palpitations and leg swelling.   Gastrointestinal: Negative for abdominal pain, blood in stool, constipation, diarrhea,  heartburn, nausea and vomiting.   Genitourinary: Negative for dysuria, frequency, hematuria and urgency.   Musculoskeletal: Positive for joint pain. Negative for back pain, falls, myalgias and neck pain.   Skin: Negative for itching and rash.   Neurological: Negative for dizziness, tingling, tremors, weakness and headaches.   Endo/Heme/Allergies: Negative for environmental allergies. Does not bruise/bleed easily.   Psychiatric/Behavioral: Negative for depression, memory loss, substance abuse and suicidal ideas. The patient is nervous/anxious. The patient does not have insomnia.        Physical Exam:  BP 134/82   Pulse 73   Temp 36.7 C (98 F)   Resp 12   Ht 1.549 m (5\' 1" )   Wt 66.2 kg (146 lb)   BMI 27.59 kg/m       Physical Exam   Constitutional: She is oriented to person, place, and time and well-developed, well-nourished, and in no distress. Vital signs are normal. No distress.   HENT:   Head: Normocephalic and atraumatic.   Eyes: Pupils are equal, round, and reactive to light. Conjunctivae and EOM are  normal.   Cardiovascular: Normal rate, regular rhythm and normal heart sounds.   Pulmonary/Chest: Effort normal and breath sounds normal.   Musculoskeletal:      Left knee: Tenderness found. Lateral joint line tenderness noted.   Neurological: She is alert and oriented to person, place, and time. Gait normal. Coordination normal.   Skin: Skin is warm and dry. She is not diaphoretic.   Psychiatric: Mood, memory, affect and judgment normal.   Nursing note and vitals reviewed.      Assessment and Plan    ENCOUNTER DIAGNOSES     ICD-10-CM   1. Left knee pain M25.562        Rest  Ice/heat  bengay as needed      Follow up: Return if symptoms worsen or fail to improve.      This patient was seen independently.    Read Drivershelsey Taylor, PA-C  05/17/2018, 14:19

## 2018-05-24 ENCOUNTER — Ambulatory Visit
Admission: RE | Admit: 2018-05-24 | Discharge: 2018-05-24 | Disposition: A | Discharge status: Home / Self Care | Payer: HMO | Source: Ambulatory Visit | Attending: Physician Assistant | Admitting: Physician Assistant

## 2018-05-24 ENCOUNTER — Other Ambulatory Visit: Payer: Self-pay

## 2018-05-24 DIAGNOSIS — M25562 Pain in left knee: Secondary | ICD-10-CM | POA: Insufficient documentation

## 2018-05-24 NOTE — Progress Notes (Signed)
Please let the patient know that xray of knee is negative

## 2018-06-12 ENCOUNTER — Telehealth (HOSPITAL_BASED_OUTPATIENT_CLINIC_OR_DEPARTMENT_OTHER): Payer: Self-pay | Admitting: Family Medicine

## 2018-06-12 ENCOUNTER — Other Ambulatory Visit (HOSPITAL_BASED_OUTPATIENT_CLINIC_OR_DEPARTMENT_OTHER): Payer: Self-pay | Admitting: Family Medicine

## 2018-06-12 DIAGNOSIS — M25562 Pain in left knee: Secondary | ICD-10-CM

## 2018-06-12 NOTE — Telephone Encounter (Signed)
Pt called and said that her left leg/knee pain is not getting any better and she would like to move forward with getting an MRI set up.

## 2018-06-15 ENCOUNTER — Ambulatory Visit
Admission: RE | Admit: 2018-06-15 | Discharge: 2018-06-15 | Disposition: A | Discharge status: Home / Self Care | Payer: HMO | Source: Ambulatory Visit | Attending: Family Medicine | Admitting: Family Medicine

## 2018-06-15 ENCOUNTER — Other Ambulatory Visit: Payer: Self-pay

## 2018-06-15 DIAGNOSIS — M25562 Pain in left knee: Principal | ICD-10-CM | POA: Insufficient documentation

## 2018-06-27 ENCOUNTER — Telehealth (HOSPITAL_BASED_OUTPATIENT_CLINIC_OR_DEPARTMENT_OTHER): Payer: Self-pay | Admitting: Family Medicine

## 2018-06-28 NOTE — Telephone Encounter (Signed)
Pt is fine for now and wants to hold off on any further testing or visits.

## 2018-08-29 ENCOUNTER — Telehealth (HOSPITAL_BASED_OUTPATIENT_CLINIC_OR_DEPARTMENT_OTHER): Payer: Self-pay | Admitting: Family Medicine

## 2018-08-29 MED ORDER — SULFAMETHOXAZOLE 800 MG-TRIMETHOPRIM 160 MG TABLET
1.00 | ORAL_TABLET | Freq: Two times a day (BID) | ORAL | 1 refills | Status: DC
Start: 2018-08-29 — End: 2018-10-05

## 2018-08-29 NOTE — Telephone Encounter (Signed)
Notified pt. 

## 2018-09-05 ENCOUNTER — Other Ambulatory Visit (INDEPENDENT_AMBULATORY_CARE_PROVIDER_SITE_OTHER): Payer: Self-pay | Admitting: Family Medicine

## 2018-09-05 DIAGNOSIS — F410 Panic disorder [episodic paroxysmal anxiety] without agoraphobia: Secondary | ICD-10-CM

## 2018-09-05 MED ORDER — ALPRAZOLAM 0.5 MG TABLET
0.50 mg | ORAL_TABLET | Freq: Three times a day (TID) | ORAL | 0 refills | Status: DC | PRN
Start: 2018-09-05 — End: 2019-01-29

## 2018-09-08 ENCOUNTER — Ambulatory Visit (INDEPENDENT_AMBULATORY_CARE_PROVIDER_SITE_OTHER): Payer: HMO | Admitting: Physician Assistant

## 2018-09-08 ENCOUNTER — Other Ambulatory Visit: Payer: Self-pay

## 2018-09-08 ENCOUNTER — Telehealth (INDEPENDENT_AMBULATORY_CARE_PROVIDER_SITE_OTHER): Payer: Self-pay | Admitting: Family Medicine

## 2018-09-08 ENCOUNTER — Encounter (INDEPENDENT_AMBULATORY_CARE_PROVIDER_SITE_OTHER): Payer: Self-pay | Admitting: Physician Assistant

## 2018-09-08 VITALS — BP 122/60 | HR 97 | Temp 98.9°F | Ht 61.0 in | Wt 145.0 lb

## 2018-09-08 DIAGNOSIS — F411 Generalized anxiety disorder: Secondary | ICD-10-CM

## 2018-09-08 DIAGNOSIS — F410 Panic disorder [episodic paroxysmal anxiety] without agoraphobia: Secondary | ICD-10-CM | POA: Insufficient documentation

## 2018-09-08 DIAGNOSIS — F320 Major depressive disorder, single episode, mild: Secondary | ICD-10-CM

## 2018-09-08 NOTE — Progress Notes (Signed)
FAMILY MED, [facility]   [address] [address]    Progress Note    Name: Elizabeth Hunter MRN:  [id number]   Date: 09/08/2018 Age: 78 y.o.         Reason for Visit: Depression (emotional problems, said mental issues)    History of Present Illness  Carolan Avedisian is a 78 y.o. female who is being seen today for increase with anxiety. States for the past 2 weeks has been having worsening symptoms of anxiety esp in the mornings. States she feels it coming on and then starts to feel better by the afternoon. Taking Celexa only taking 20mg  right now. But in the past 2 days increased from 20mg  to 40mg  daily. Also started taking a 1/2 tablet of the xanax she has at bedtime which does seem to help. States she hates having to take the xanax.     Past Medical History:   Diagnosis Date   . Bowel trouble     obstruction/nonsurgical   . Chronic headaches    . Depression    . Fibromyalgia    . Osteoarthritis          Past Surgical History:   Procedure Laterality Date   . HX APPENDECTOMY     . HX CESAREAN SECTION      2   . HX CHOLECYSTECTOMY     . HX LAP CHOLECYSTECTOMY  2012   . HX SINUS SURGERY  2014         Current Outpatient Medications   Medication Sig   . ALPRAZolam (XANAX) 0.5 mg Oral Tablet Take 1 Tab (0.5 mg total) by mouth Three times a day as needed for Insomnia   . Ca-D3-mag ox-zinc-cop-mang-bor (CALCIUM 600+D3 PLUS) 600 mg calcium- 800 unit-50 mg Oral Tablet Take by mouth   . citalopram (CELEXA) 40 mg Oral Tablet Take 1 Tab (40 mg total) by mouth Once a day   . docusate sodium (STOOL SOFTENER) 100 mg Oral Capsule Take 100 mg by mouth Once a day   . Herbal Drugs (COLON HERBAL CLEANSER) Oral Capsule Take by mouth   . Lactobacillus acidophilus (PROBIOTIC ACIDOPHILUS) 1.5 mg (250 million cell) Oral Capsule Take by mouth   . meloxicam (MOBIC) 15 mg Oral Tablet Take 1 Tab (15 mg total) by mouth Once a day   . trimethoprim-sulfamethoxazole (BACTRIM DS) 160-800mg  per tablet Take 1 Tab (160 mg  total) by mouth Twice daily     No Known Allergies  Family Medical History:     Problem Relation (Age of Onset)    Heart Attack Father (55), Sister (58)    Hypertension (High Blood Pressure) Mother (48)            Social History     Tobacco Use   . Smoking status: Never Smoker   . Smokeless tobacco: Never Used   Substance Use Topics   . Alcohol use: No   . Drug use: No       Nursing Notes  There are no exam notes on file for this visit.     Review of Systems  Review of Systems   Constitutional: Negative for chills, fever, malaise/fatigue and weight loss.   HENT: Negative for congestion, ear discharge, ear pain, hearing loss, nosebleeds, sinus pain and sore throat.    Eyes: Negative for blurred vision, photophobia and discharge.   Respiratory: Negative for cough, hemoptysis, sputum production, shortness of breath and wheezing.    Cardiovascular:  Negative for chest pain, palpitations and leg swelling.   Gastrointestinal: Negative for abdominal pain, blood in stool, constipation, diarrhea, heartburn, nausea and vomiting.   Genitourinary: Negative for dysuria, frequency, hematuria and urgency.   Musculoskeletal: Positive for back pain and joint pain. Negative for falls, myalgias and neck pain.   Skin: Negative for itching and rash.   Neurological: Negative for dizziness, tingling, tremors, weakness and headaches.   Endo/Heme/Allergies: Negative for environmental allergies. Does not bruise/bleed easily.   Psychiatric/Behavioral: Positive for depression. Negative for memory loss, substance abuse and suicidal ideas. The patient is nervous/anxious and has insomnia.        Physical Exam:  BP 122/60 (Site: Right, Patient Position: Sitting, Cuff Size: Adult Large)   Pulse 97   Temp 37.2 C (98.9 F) (Oral)   Ht 1.549 m (5\' 1" )   Wt 65.8 kg (145 lb)   SpO2 97%   BMI 27.40 kg/m       Physical Exam   Constitutional: She is oriented to person, place, and time and well-developed, well-nourished, and in no distress. Vital  signs are normal. No distress.   HENT:   Head: Normocephalic and atraumatic.   Eyes: Pupils are equal, round, and reactive to light. Conjunctivae and EOM are normal.   Neurological: She is alert and oriented to person, place, and time. Gait normal. Coordination normal.   Skin: Skin is warm and dry. She is not diaphoretic.   Psychiatric: Mood, memory, affect and judgment normal.   Nursing note and vitals reviewed.      Assessment and Plan    ENCOUNTER DIAGNOSES     ICD-10-CM   1. GAD (generalized anxiety disorder) F41.1   2. 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      This patient was seen independently.    Read Drivershelsey Taylor, PA-C  09/08/2018, 13:02

## 2018-09-08 NOTE — Telephone Encounter (Signed)
Pt would like to see you today.  She is not wanting to do a video or phone visit.  She said she could come in anytime today.  She is starting to have those bad feelings like before and is afraid if she lets it go shell end up in bad anxiety like in the past.  She said just talking to you calms and helps her  [phone]

## 2018-09-15 ENCOUNTER — Telehealth (INDEPENDENT_AMBULATORY_CARE_PROVIDER_SITE_OTHER): Payer: Self-pay | Admitting: Family Medicine

## 2018-09-15 NOTE — Telephone Encounter (Signed)
Pt  Wanted to let you know that she is feeling much much better and thanks you for the time you spent with her last week

## 2018-09-18 NOTE — Telephone Encounter (Signed)
Great. Thanks for letting me know.

## 2018-09-28 ENCOUNTER — Telehealth (INDEPENDENT_AMBULATORY_CARE_PROVIDER_SITE_OTHER): Payer: Self-pay | Admitting: Family Medicine

## 2018-09-28 DIAGNOSIS — M545 Low back pain, unspecified: Secondary | ICD-10-CM

## 2018-09-28 NOTE — Telephone Encounter (Signed)
Pt is good with having the lumbar spine films done on Friday [facility] at the [facility]

## 2018-09-28 NOTE — Telephone Encounter (Signed)
PT SAID She IS HAVING SEVERE BACK PROBLEMS  She WOULD LIKE TO SEE YOU Friday IF POSSIBLE,  She SAID She HAS SEVERAL OTHER APPOINTMENTS TODAY   She FEELS She MAY NEED A REFERRAL TO SEE A NEUROSURGEON  FOR SURGERY AND WANTS TO GET THE BALL ROLLING ON THE THINGS THAT NEED DONE

## 2018-09-29 ENCOUNTER — Other Ambulatory Visit (INDEPENDENT_AMBULATORY_CARE_PROVIDER_SITE_OTHER): Payer: HMO

## 2018-09-29 ENCOUNTER — Other Ambulatory Visit: Payer: Self-pay

## 2018-09-29 DIAGNOSIS — M545 Low back pain, unspecified: Secondary | ICD-10-CM

## 2018-09-29 NOTE — Progress Notes (Signed)
Please let the patient know that xray shows advanced deg changes we will discuss more next week with her at her appointment

## 2018-10-05 ENCOUNTER — Other Ambulatory Visit: Payer: Self-pay

## 2018-10-05 ENCOUNTER — Encounter (INDEPENDENT_AMBULATORY_CARE_PROVIDER_SITE_OTHER): Payer: Self-pay | Admitting: Physician Assistant

## 2018-10-05 ENCOUNTER — Ambulatory Visit (INDEPENDENT_AMBULATORY_CARE_PROVIDER_SITE_OTHER): Payer: HMO | Admitting: Physician Assistant

## 2018-10-05 VITALS — BP 126/78 | HR 77 | Temp 97.9°F | Resp 12 | Ht 61.0 in | Wt 145.2 lb

## 2018-10-05 DIAGNOSIS — M5126 Other intervertebral disc displacement, lumbar region: Secondary | ICD-10-CM

## 2018-10-05 DIAGNOSIS — M545 Low back pain, unspecified: Secondary | ICD-10-CM

## 2018-10-05 DIAGNOSIS — M5416 Radiculopathy, lumbar region: Secondary | ICD-10-CM

## 2018-10-05 MED ORDER — METHOCARBAMOL 500 MG TABLET
500.00 mg | ORAL_TABLET | Freq: Every evening | ORAL | 0 refills | Status: AC | PRN
Start: 2018-10-05 — End: ?

## 2018-10-05 NOTE — Progress Notes (Signed)
FAMILY MED, [facility]   [address]  [place] [id number]    Progress Note    Name: Elizabeth Overliedith Tschantz MRN:  [id number]   Date: 10/05/2018 Age: 78 y.o.         Reason for Visit: Back Pain (having back pain/considering surgery)    History of Present Illness  Elizabeth Hunter is a 78 y.o. female who is being seen today for back pain. Chronic low back pain. States she has been having for years. Has gone to pain management and had injections. Unsure of her last MRI but [facility] That is also where she has had PT and additional testing on her back. Pt denies any new injury. States it just seems like the pain is worsening in her back. Is starting to go down into her legs and buttocks. Denies known fever. Denies loss of bowel/bladder function. States in the past she has been told she may need surgery. States she would like to consult and talk about her options with neurosurgeon.       Past Medical History:   Diagnosis Date   . Bowel trouble     obstruction/nonsurgical   . Chronic headaches    . Depression    . Fibromyalgia    . Osteoarthritis          Past Surgical History:   Procedure Laterality Date   . HX APPENDECTOMY     . HX CESAREAN SECTION      2   . HX CHOLECYSTECTOMY     . HX LAP CHOLECYSTECTOMY  2012   . HX SINUS SURGERY  2014         Current Outpatient Medications   Medication Sig   . ALPRAZolam (XANAX) 0.5 mg Oral Tablet Take 1 Tab (0.5 mg total) by mouth Three times a day as needed for Insomnia   . Ca-D3-mag ox-zinc-cop-mang-bor (CALCIUM 600+D3 PLUS) 600 mg calcium- 800 unit-50 mg Oral Tablet Take by mouth   . citalopram (CELEXA) 40 mg Oral Tablet Take 1 Tab (40 mg total) by mouth Once a day   . docusate sodium (STOOL SOFTENER) 100 mg Oral Capsule Take 100 mg by mouth Once a day   . Herbal Drugs (COLON HERBAL CLEANSER) Oral Capsule Take by mouth   . Lactobacillus acidophilus (PROBIOTIC ACIDOPHILUS) 1.5 mg (250 million cell) Oral Capsule Take by mouth   . meloxicam (MOBIC) 15 mg Oral  Tablet Take 1 Tab (15 mg total) by mouth Once a day   . methocarbamoL (ROBAXIN) 500 mg Oral Tablet Take 1 Tab (500 mg total) by mouth Every night as needed     No Known Allergies  Family Medical History:     Problem Relation (Age of Onset)    Heart Attack Father 5(68), Sister 17(58)    Hypertension (High Blood Pressure) Mother 18(66)            Social History     Tobacco Use   . Smoking status: Never Smoker   . Smokeless tobacco: Never Used   Substance Use Topics   . Alcohol use: No   . Drug use: No       Nursing Notes  There are no exam notes on file for this visit.     Review of Systems  Review of Systems   Constitutional: Negative for chills, fever, malaise/fatigue and weight loss.   HENT: Negative for congestion, ear discharge, ear pain, hearing loss, nosebleeds, sinus pain and sore  throat.    Eyes: Negative for blurred vision, photophobia and discharge.   Respiratory: Negative for cough, hemoptysis, sputum production, shortness of breath and wheezing.    Cardiovascular: Negative for chest pain, palpitations and leg swelling.   Gastrointestinal: Negative for abdominal pain, blood in stool, constipation, diarrhea, heartburn, nausea and vomiting.   Genitourinary: Negative for dysuria, frequency, hematuria and urgency.   Musculoskeletal: Positive for back pain and joint pain. Negative for falls, myalgias and neck pain.   Skin: Negative for itching and rash.   Neurological: Negative for dizziness, tingling, tremors, weakness and headaches.   Endo/Heme/Allergies: Negative for environmental allergies. Does not bruise/bleed easily.   Psychiatric/Behavioral: Negative for depression, memory loss, substance abuse and suicidal ideas. The patient is nervous/anxious. The patient does not have insomnia.        Physical Exam:  BP 126/78 (Site: Right, Patient Position: Sitting, Cuff Size: Adult Small)   Pulse 77   Temp 36.6 C (97.9 F) (Oral)   Resp 12   Ht 1.549 m (5\' 1" )   Wt 65.9 kg (145 lb 3.2 oz)   SpO2 97%   BMI 27.44  kg/m       Physical Exam   Constitutional: She is oriented to person, place, and time and well-developed, well-nourished, and in no distress. Vital signs are normal. No distress.   HENT:   Head: Normocephalic and atraumatic.   Eyes: Pupils are equal, round, and reactive to light. Conjunctivae and EOM are normal.   Cardiovascular: Normal rate, regular rhythm and normal heart sounds.   Pulmonary/Chest: Effort normal and breath sounds normal.   Musculoskeletal:      Lumbar back: She exhibits decreased range of motion, tenderness and spasm.   Neurological: She is alert and oriented to person, place, and time. Gait normal. Coordination normal.   Skin: Skin is warm and dry. She is not diaphoretic.   Psychiatric: Mood, memory, affect and judgment normal.   Nursing note and vitals reviewed.      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 Low back pain  M54.5   3. Lumbar radiculopathy  M54.16        Rest  Ice/heat  Will make referral to neurosurgeon   Will order MRI of lumbar spine    Orders Placed This Encounter   . MRI Lumbosacral Spine WO   . OUTSIDE CONSULT/REFERRAL PROVIDER(AMB)   . methocarbamoL (ROBAXIN) 500 mg Oral Tablet       Follow up: Return for Follow up after testing.      This patient was seen independently.    Camillia Herter, PA-C  10/05/2018, 13:54

## 2018-10-17 ENCOUNTER — Ambulatory Visit
Admission: RE | Admit: 2018-10-17 | Discharge: 2018-10-17 | Disposition: A | Discharge status: Home / Self Care | Payer: HMO | Source: Ambulatory Visit | Attending: Physician Assistant | Admitting: Physician Assistant

## 2018-10-17 ENCOUNTER — Other Ambulatory Visit: Payer: Self-pay

## 2018-10-17 DIAGNOSIS — M545 Low back pain, unspecified: Secondary | ICD-10-CM

## 2018-10-17 DIAGNOSIS — M5126 Other intervertebral disc displacement, lumbar region: Secondary | ICD-10-CM | POA: Insufficient documentation

## 2018-10-17 DIAGNOSIS — M5416 Radiculopathy, lumbar region: Secondary | ICD-10-CM | POA: Insufficient documentation

## 2018-10-18 ENCOUNTER — Telehealth (INDEPENDENT_AMBULATORY_CARE_PROVIDER_SITE_OTHER): Payer: Self-pay | Admitting: Family Medicine

## 2018-10-18 ENCOUNTER — Telehealth (INDEPENDENT_AMBULATORY_CARE_PROVIDER_SITE_OTHER): Payer: Self-pay | Admitting: Physician Assistant

## 2018-10-18 NOTE — Progress Notes (Signed)
Will you please let the patient know that we got her MRI back and it is abnormal and i would like her to consult with neurosurgeon to see if they would be able to help her. Dr. Rudene Re

## 2018-10-18 NOTE — Telephone Encounter (Signed)
Health plan would like to talk to you about a prior auth that was submitted for her  [phone]

## 2018-10-18 NOTE — Telephone Encounter (Signed)
I don't know.

## 2018-10-18 NOTE — Telephone Encounter (Signed)
This is in regards to his robaxin PA.    Health plan rep Destiny called to ask if the patient has tried and failed Zanaflex. That is the preferred product.   Her direct number is [phone]

## 2018-10-19 ENCOUNTER — Encounter (HOSPITAL_COMMUNITY): Payer: Self-pay | Admitting: Emergency Medicine

## 2018-10-19 ENCOUNTER — Emergency Department (HOSPITAL_COMMUNITY): Payer: Medicare Other

## 2018-10-19 ENCOUNTER — Other Ambulatory Visit: Payer: Self-pay

## 2018-10-19 ENCOUNTER — Emergency Department (HOSPITAL_COMMUNITY)
Admission: EM | Admit: 2018-10-19 | Discharge: 2018-10-19 | Disposition: A | Discharge status: Home / Self Care | Payer: Medicare Other | Attending: Emergency Medicine | Admitting: Emergency Medicine

## 2018-10-19 DIAGNOSIS — Z87891 Personal history of nicotine dependence: Secondary | ICD-10-CM | POA: Insufficient documentation

## 2018-10-19 DIAGNOSIS — I10 Essential (primary) hypertension: Secondary | ICD-10-CM | POA: Diagnosis not present

## 2018-10-19 HISTORY — DX: Unspecified asthma, uncomplicated: J45.909

## 2018-10-19 LAB — CBC
HCT: 46.1 % — ABNORMAL HIGH (ref 36.0–46.0)
Hemoglobin: 15.6 g/dL — ABNORMAL HIGH (ref 12.0–15.0)
MCH: 32.6 pg (ref 26.0–34.0)
MCHC: 33.8 g/dL (ref 30.0–36.0)
MCV: 96.2 fL (ref 80.0–100.0)
Platelets: 222 10*3/uL (ref 150–400)
RBC: 4.79 MIL/uL (ref 3.87–5.11)
RDW: 12.7 % (ref 11.5–15.5)
WBC: 5.3 10*3/uL (ref 4.0–10.5)
nRBC: 0 % (ref 0.0–0.2)

## 2018-10-19 LAB — BASIC METABOLIC PANEL
Anion gap: 10 (ref 5–15)
BUN: 10 mg/dL (ref 8–23)
CO2: 28 mmol/L (ref 22–32)
Calcium: 9.6 mg/dL (ref 8.9–10.3)
Chloride: 102 mmol/L (ref 98–111)
Creatinine, Ser: 0.8 mg/dL (ref 0.44–1.00)
GFR calc Af Amer: >60 mL/min (ref >60)
GFR calc non Af Amer: >60 mL/min (ref >60)
Glucose, Bld: 110 mg/dL — ABNORMAL HIGH (ref 70–99)
Potassium: 4.1 mmol/L (ref 3.5–5.1)
Sodium: 140 mmol/L (ref 135–145)

## 2018-10-19 LAB — TROPONIN I (HIGH SENSITIVITY)
Troponin I (High Sensitivity): 4 ng/L (ref <18)
Troponin I (High Sensitivity): 4 ng/L (ref <18)

## 2018-10-19 MED ORDER — SODIUM CHLORIDE 0.9% FLUSH: 3.0000 mL | Freq: Once | INTRAVENOUS | Status: DC

## 2018-10-19 MED ORDER — HYDROCHLOROTHIAZIDE 12.5 MG PO CAPS: 12.5000 mg | ORAL_CAPSULE | Freq: Every day | ORAL | 0 refills | Status: AC

## 2018-10-19 NOTE — ED Notes (Signed)
Discharge instructions and prescription discussed with Pt. Pt verbalized understanding. Pt stable and ambulatory.    

## 2018-10-19 NOTE — ED Provider Notes (Signed)
[facility]: Erin Perry EMERGENCY DEPARTMENT Provider Note   CSN: [id number] Arrival date & time: 10/19/18  1130     History   Chief Complaint Chief Complaint  Patient presents with  . Hypertension    HPI Erin Perry is a 78 y.o. female.     Patient with history of smoking --presents with complaint of elevated blood pressures.  Patient states that approximately 5 weeks ago she developed pain across her middle back that she related to working on her yard.  Patient is very active.  This improved but returned recently over the past few days.  She checked her blood pressures at home and found that to be very high for her.  She notes that after waking up this morning her blood pressure was approximately 190/90.  Patient denies any headaches, vision change or vision loss.  She denies any anterior chest pain, shortness of breath.  No worsening of her exercise tolerance.  No history of kidney trouble.  She denies any abdominal pain, weakness, numbness, or tingling in her arms or her legs.  No stroke symptoms.  Patient states that her primary care doctor was not able to see her until next week so she went to the fire station who recommended that she go to urgent care.  There she had a "abnormal EKG" showing a possible septal infarct with a PVC.  She was then referred to the emergency department for further cardiac evaluation.     Past Medical History:  Diagnosis Date  . Anxiety   . Asthma   . Multinodular goiter     Patient Active Problem List   Diagnosis Date Noted  . Anxiety   . Multinodular goiter     History reviewed. No pertinent surgical history.   OB History   No obstetric history on file.      Home Medications    Prior to Admission medications   Medication Sig Start Date End Date Taking? Authorizing Provider  busPIRone (BUSPAR) 5 MG tablet TAKE 1 TABLET(5 MG) BY MOUTH TWICE DAILY 10/08/15   [provider]  Multiple Vitamins-Minerals (MULTIVITAMIN ADULT  PO) Take by mouth.    [provider]    Family History Family History  Problem Relation Age of Onset  . Thyroid disease Neg Hx     Social History Social History   Tobacco Use  . Smoking status: Former Games developer  . Smokeless tobacco: Never Used  Substance Use Topics  . Alcohol use: Yes  . Drug use: Not on file     Allergies   Ceftin  [cefuroxime axetil], Clarithromycin, Talwin [pentazocine], Amoxicillin, Gabapentin, Ibuprofen, Iodinated diagnostic agents, Naproxen, Other, Penicillins, Sulfa antibiotics, Tequin [gatifloxacin], and Zoloft  [sertraline hcl]   Review of Systems Review of Systems  Constitutional: Negative for diaphoresis and fever.  Eyes: Negative for redness.  Respiratory: Negative for cough and shortness of breath.   Cardiovascular: Negative for chest pain, palpitations and leg swelling.  Gastrointestinal: Negative for abdominal pain, nausea and vomiting.  Genitourinary: Negative for dysuria.  Musculoskeletal: Positive for back pain. Negative for neck pain.  Skin: Negative for rash.  Neurological: Negative for syncope and light-headedness.  Psychiatric/Behavioral: The patient is not nervous/anxious.      Physical Exam Updated Vital Signs BP (!) 167/97 (BP Location: Right Arm)   Pulse 87   Temp 98.2 F (36.8 C) (Oral)   Resp 16   Ht 5\' 5"  (1.651 m)   Wt 61.7 kg   SpO2 100%   BMI 22.63  kg/m   Physical Exam Vitals signs and nursing note reviewed.  Constitutional:      Appearance: She is well-developed. She is not diaphoretic.  HENT:     Head: Normocephalic and atraumatic.     Mouth/Throat:     Mouth: Mucous membranes are not dry.  Eyes:     Conjunctiva/sclera: Conjunctivae normal.  Neck:     Musculoskeletal: Normal range of motion and neck supple. No muscular tenderness.     Vascular: Normal carotid pulses. No carotid bruit or JVD.     Trachea: Trachea normal. No tracheal deviation.  Cardiovascular:     Rate and Rhythm: Normal rate  and regular rhythm.     Pulses: No decreased pulses.     Heart sounds: Normal heart sounds, S1 normal and S2 normal. No murmur.  Pulmonary:     Effort: Pulmonary effort is normal. No respiratory distress.     Breath sounds: No wheezing.  Chest:     Chest wall: No tenderness.  Abdominal:     General: Bowel sounds are normal.     Palpations: Abdomen is soft.     Tenderness: There is no abdominal tenderness. There is no guarding or rebound.  Musculoskeletal: Normal range of motion.  Skin:    General: Skin is warm and dry.     Coloration: Skin is not pale.  Neurological:     Mental Status: She is alert.  Psychiatric:        Mood and Affect: Mood is anxious.      ED Treatments / Results  Labs (all labs ordered are listed, but only abnormal results are displayed) Labs Reviewed  BASIC METABOLIC PANEL - Abnormal; Notable for the following components:      Result Value   Glucose, Bld 110 (*)    All other components within normal limits  CBC - Abnormal; Notable for the following components:   Hemoglobin 15.6 (*)    HCT 46.1 (*)    All other components within normal limits  TROPONIN I (HIGH SENSITIVITY)  TROPONIN I (HIGH SENSITIVITY)    ED ECG REPORT   Date: 10/19/2018  Rate: 102  Rhythm: sinus tachycardia  QRS Axis: right  Intervals: normal and PR prolonged  ST/T Wave abnormalities: normal  Conduction Disutrbances:none  Narrative Interpretation:   Old EKG Reviewed: unchanged  I have personally reviewed the EKG tracing and agree with the computerized printout as noted.  Radiology Dg Chest 2 View  Result Date: 10/19/2018 CLINICAL DATA:  Chest pain EXAM: CHEST - 2 VIEW COMPARISON:  None. FINDINGS: The heart size and mediastinal contours are within normal limits. There is hyperinflation of the lung zones with flattening of the hemidiaphragms. Mildly increased interstitial markings seen in both lung apices with mild biapical scarring. No large airspace consolidation. There  is diffuse osteopenia and degenerative changes in the midthoracic spine. IMPRESSION: Mildly increased interstitial markings at both lung apices, likely chronic changes. Findings of COPD Electronically Signed   By: Jonna ClarkBindu  Avutu M.D.   On: 10/19/2018 11:59    Procedures Procedures (including critical care time)  Medications Ordered in ED Medications  sodium chloride flush (NS) 0.9 % injection 3 mL (has no administration in time range)     Initial Impression / Assessment and Plan / ED Course  I have reviewed the triage vital signs and the nursing notes.  Pertinent labs & imaging results that were available during my care of the patient were reviewed by me and considered in my medical decision making (  see chart for details).  Clinical Course as of Oct 18 1609  Thu Oct 19, 2018  1542 Patient seen by me as well as the PA.  Briefly 78 year old female presenting with asymptomatic hypertension.  She reports noting her blood pressure was elevated for the past 2 to 3 days, with a pressure as high as 440 systolic in the morning.  She has been attempting to reach her PCP but unable to get through to her doctor at the office.  She went to urgent care today where her blood pressure was again elevated and she was referred into the ED for further evaluation.  She is currently completely asymptomatic denies chest pain, headache, nausea, vomiting, vision changes, dysuria or hematuria.  She reports her blood pressures typically in the low 102V systolic.  She has never had a history of hypertension or any other medical problems aside from asthma.  She does not smoke.  She does report 4 to 5 weeks of midline back pain which was felt to be muscular and began after she was doing vigorous gardening outside 1 day.  She reports this pain is transient tends to go away, and currently it is not present.  She says the pain gets better with Tylenol and with heating packs.  She says when the pain does come back it is related to  her bending over and picking up things.  There is no family history of aneurysm.  She has no personal or family history of blood clots.  On my exam she is extremely well-appearing.  Bilateral cuff pressure is equal.  She has equal pulses bilaterally.  She has no cardiac murmurs and her lungs are clear to auscultation.  I have a low suspicion for aortic session or pulmonary embolism based on this.  I likewise have a low suspicion for hypertensive emergency.  Her kidney function appears normal here.  Her troponin levels were negative.  We discussed management options we will discharge her with follow-up with her primary care physician.  She is amenable to this plan.   [MT]    Clinical Course User Index [MT] Trifan, Carola Rhine, MD       Patient seen and examined. Work-up initiated. She looks anxious but well.   Vital signs reviewed and are as follows: BP (!) 167/97 (BP Location: Right Arm)   Pulse 87   Temp 98.2 F (36.8 C) (Oral)   Resp 16   Ht 5\' 5"  (1.651 m)   Wt 61.7 kg   SpO2 100%   BMI 22.63 kg/m   4:25 PM patient discussed earlier with Dr. Langston Masker who has seen patient.  Patient's blood pressure has normalized here without treatment.  Her work-up is reassuring.  Patient and husband updated on results.  We will discharged home with low-dose HCTZ to take if systolic blood pressure is above 160 at home.  She will follow-up with her doctor next week for further evaluation and recheck.  She is encouraged to keep track of her blood pressures and bring these to her doctor.  Final Clinical Impressions(s) / ED Diagnoses   Final diagnoses:  Essential hypertension   Patient presented to urgent care with elevated blood pressure, no history of hypertension.  She was sent to the emergency department for cardiac evaluation.  Troponin negative x2.  X-rays negative.  Blood pressures have improved here without treatment.  No other evidence of endorgan damage including kidneys.  No strokelike  symptoms.  No chest pain or shortness of breath.  Treatment  plan as above.  Patient will be discharged with PCP follow-up.  ED Discharge Orders         Ordered    hydrochlorothiazide (MICROZIDE) 12.5 MG capsule  Daily     10/19/18 1607           Renne CriglerGeiple, Eamonn Sermeno, PA-C 10/19/18 1626    Terald Sleeperrifan, Matthew J, MD 10/19/18 1943

## 2018-10-19 NOTE — ED Triage Notes (Signed)
Pt states she has been taking her BP at home and it has been running high. Pt went to urgent care, and was sent here for an abnormal EKG. Pt denies SOB. Pt feels a little dizzy at times. Pt does endorse mid upper back pain that has been going on for 3 days. Pt's primary MD thought she had perhaps strained it.

## 2018-10-19 NOTE — Discharge Instructions (Signed)
Please read and follow all provided instructions.  Your diagnoses today include:  1. Essential hypertension     Tests performed today include:  An EKG of your heart  A chest x-ray  Cardiac enzymes - a blood test for heart muscle damage  Blood counts and electrolytes  Vital signs. See below for your results today.   Medications prescribed:   Hydrochlorothiazide (HCTZ) -medication for blood pressure  Please take if the top number of your blood pressure is greater than 160.  Take any prescribed medications only as directed.  Follow-up instructions: Please follow-up with your primary care provider as soon as you can for further evaluation of your symptoms.   Return instructions:  SEEK IMMEDIATE MEDICAL ATTENTION IF:  You have severe chest pain, especially if the pain is crushing or pressure-like and spreads to the arms, back, neck, or jaw, or if you have sweating, nausea (feeling sick to your stomach), or shortness of breath. THIS IS AN EMERGENCY. Don't wait to see if the pain will go away. Get medical help at once. Call 911 or 0 (operator). DO NOT drive yourself to the hospital.   Your chest pain gets worse and does not go away with rest.   You have an attack of chest pain lasting longer than usual, despite rest and treatment with the medications your caregiver has prescribed.   You wake from sleep with chest pain or shortness of breath.  You feel dizzy or faint.  You have chest pain not typical of your usual pain for which you originally saw your caregiver.   You have any other emergent concerns regarding your health.  Additional Information: Chest pain comes from many different causes. Your caregiver has diagnosed you as having chest pain that is not specific for one problem, but does not require admission.  You are at low risk for an acute heart condition or other serious illness.   Your vital signs today were: BP (!) 144/77    Pulse 74    Temp 98.2 F (36.8 C)  (Oral)    Resp 19    Ht 5\' 5"  (1.651 m)    Wt 61.7 kg    SpO2 97%    BMI 22.63 kg/m  If your blood pressure (BP) was elevated above 135/85 this visit, please have this repeated by your doctor within one month. --------------

## 2018-10-25 ENCOUNTER — Encounter (INDEPENDENT_AMBULATORY_CARE_PROVIDER_SITE_OTHER): Payer: Self-pay | Admitting: Family Medicine

## 2018-10-25 NOTE — Progress Notes (Signed)
Patient scheduled 12/05/18 @ 0900. 10/25/18 elt

## 2018-11-03 ENCOUNTER — Ambulatory Visit (INDEPENDENT_AMBULATORY_CARE_PROVIDER_SITE_OTHER): Payer: Self-pay | Admitting: Family

## 2018-11-08 ENCOUNTER — Ambulatory Visit (INDEPENDENT_AMBULATORY_CARE_PROVIDER_SITE_OTHER): Payer: HMO | Admitting: Family

## 2018-11-08 ENCOUNTER — Other Ambulatory Visit (INDEPENDENT_AMBULATORY_CARE_PROVIDER_SITE_OTHER): Payer: HMO

## 2018-11-08 ENCOUNTER — Other Ambulatory Visit: Payer: Self-pay

## 2018-11-08 ENCOUNTER — Encounter (INDEPENDENT_AMBULATORY_CARE_PROVIDER_SITE_OTHER): Payer: Self-pay | Admitting: Family

## 2018-11-08 VITALS — BP 130/70 | HR 83 | Temp 97.6°F

## 2018-11-08 DIAGNOSIS — M25571 Pain in right ankle and joints of right foot: Secondary | ICD-10-CM

## 2018-11-08 DIAGNOSIS — W1840XA Slipping, tripping and stumbling without falling, unspecified, initial encounter: Secondary | ICD-10-CM

## 2018-11-08 DIAGNOSIS — S93401A Sprain of unspecified ligament of right ankle, initial encounter: Secondary | ICD-10-CM

## 2018-11-08 NOTE — Patient Instructions (Signed)
Understanding Ankle Sprain    The ankle is the joint where the leg and foot meet. Bones are held in place by connective tissue called ligaments. When ankle ligaments are stretched to the point of pain and injury, it's called an ankle sprain. A sprain can tear the ligaments. These tears can be very small but still cause pain. Ankle sprains are graded by the amount of ligament damage.    Grade 1 (mild). There is slight stretching and tiny tears to the ligament fibers. You may have mild ankle swelling and tenderness.   Grade 2 (moderate). This is a partial ligament tear and causes moderate ankle swelling and tenderness. There may be abnormal looseness when the healthcare provider moves your joint.   Grade 3 (severe). There is a complete tear to the ligament and a great deal of ankle swelling and tenderness. The ankle joint may be very unstable.  What causes an ankle sprain?  A sprain may occur when you twist your ankle or bend it too far. This can happen when you stumble or fall. Things that can make an ankle sprain more likely include:    Having had an ankle sprain before   Playing sports that involve running and jumping. Or playing contact sports such as football or hockey.   Wearing shoes that don't support your feet and ankles well   Having ankles with poor strength and flexibility  Symptoms of an ankle sprain  Symptoms may include:   Pain or soreness in the ankle   Swelling   Redness or bruising   Not being able to walk or put weight on the affected foot   Reduced range of motion in the ankle   A popping or tearing feeling at the time the sprain occurs   An abnormal or dislocated look to the ankle   Instability or too much range of motion in the ankle  Treatment for an ankle sprain  Treatment focuses on reducing pain and swelling, and preventing further injury. Treatments may include:    Resting the ankle. Avoid putting weight on it. This may mean using crutches until the sprain  heals.   Prescription or over-the-counter medicines. These help reduce swelling and pain.   Cold packs. These help reduce pain and swelling.   Raising your ankle above your heart. This helps reduce swelling.   Wrapping the ankle with an elastic bandage or ankle brace. This helps reduce swelling and gives some support to the ankle. In rare cases, you may need a cast or boot.   Stretching and other exercises. These improve flexibility and strength.   Heat packs. These may be recommended before doing ankle exercises.  Possible complications of an ankle sprain  An ankle that has been weakened by a sprain can be more likely to have repeated sprains afterward. Doing exercises to strengthen your ankle and improve balance can reduce your risk for repeated sprains. Other possible complications are long-term (chronic) pain or an ankle that remains unstable.   When to call your healthcare provider  Call your healthcare provider right away if you have any of these:   Fever of 100.4F (38C) or higher, or as directed by your provider   Chills   Pain, numbness, discoloration, or coldness in the foot or toes   Pain that gets worse   Symptoms that don't get better, or get worse   New symptoms  [employer] last reviewed this educational content on 07/02/2017   2000-2020 [employer] [address], [place] [address]. All rights reserved. This information is not intended as a substitute for professional medical care. Always follow your healthcare professional's instructions.        Treating Ankle Sprains  Treatment will depend on how bad your sprain is. For a severe sprain, healing may take 3 months or more.  Right after your injury: Use R.I.C.E.   BIG: Rest: At first, keep weight off the ankle as much as you can. You may be given crutches to help you walk without putting weight on the ankle.   BIG: Ice: Put an ice pack on the ankle for20 minutes. Remove the pack and wait at least 30 minutes. Repeat  for up to 3 days. This helps reduce swelling.   BIG: Compression: To reduce swelling and keep the joint stable, you may need to wrap the ankle with an elastic bandage. For more severe sprains, you may need an ankle brace, a boot,or a cast.   BIG: Elevation: To reduce swelling, keep your ankle raised above your heart when you sit or lie down.  Medicine  Yourhealthcare providermay suggest oral nonsteroidal anti-inflammatory medicine (NSAIDs), such as ibuprofen. This relieves the pain and helps reduce swelling. Be sure to take your medicine as directed.  Exercises    After about 2 to 3 weeks, you may be given exercises to strengthen the ligaments and muscles in the ankle. Doing these exercises will help prevent another ankle sprain. Exercises may include standing on your toes and then on your heels and doing ankle curls.   Sit on the edge of a sturdy table or lie on your back.   Pull your toes toward you. Then point them away from you. Repeat for 2 to 3 minutes.  [employer] last reviewed this educational content on 02/02/2016   2000-2020 [employer] [address], [place] [address]. All rights reserved. This information is not intended as a substitute for professional medical care. Always follow your healthcare professional's instructions.

## 2018-11-08 NOTE — Progress Notes (Signed)
Name: Elizabeth Hunter MRN:  [id number]   Date: 11/08/2018 Age: 78 y.o.     Chief Complaint            Ankle Pain right ankle x 2 days          HPI: Elizabeth Hunter is a 78 y.o. female who presents today with Ankle Pain (right ankle x 2 days).  The patient reports twisting her ankle and developing pain immediately afterward.  The pain has progressively gotten worse upon ambulation.  She is ambulating with assistance of 1 crutch today from home.  She states that bearing weight aggravates the pain.  Resting helps.  She has not had anything for pain today.  She is also using an Ace bandage.  The patient denies numbness and tingling of the extremity, skin temperature changes, decreased range of motion of the extremity.  She has noted a mild amount of a swelling around her lateral malleolus with a small greenish bruise as well.  She is requesting an x-ray today.    History:  Vital signs and history as obtained by clinical staff.  Past Medical History:   Diagnosis Date   . Bowel trouble     obstruction/nonsurgical   . Chronic headaches    . Depression    . Fibromyalgia    . Osteoarthritis          Past Surgical History:   Procedure Laterality Date   . HX APPENDECTOMY     . HX CESAREAN SECTION      2   . HX CHOLECYSTECTOMY     . HX LAP CHOLECYSTECTOMY  2012   . HX SINUS SURGERY  2014         Family Medical History:     Problem Relation (Age of Onset)    Heart Attack Father (34), Sister (46)    Hypertension (High Blood Pressure) Mother (24)            Social History     Socioeconomic History   . Marital status: Married     Spouse name: Not on file   . Number of children: Not on file   . Years of education: Not on file   . Highest education level: Not on file   Occupational History   . Occupation: SELF     Comment: [employer]   Tobacco Use   . Smoking status: Never Smoker   . Smokeless tobacco: Never Used   Substance and Sexual Activity   . Alcohol use: No   . Drug use: No   Other Topics Concern   . Drives Yes   . Uses Cane Yes      . Uses walker Yes   . Uses wheelchair Yes   . Right hand dominant Yes     Expanded Substance History     Additional history       Allergies:  No Known Allergies  Problem List:  Patient Active Problem List    Diagnosis   . GAD (generalized anxiety disorder)   . 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 Hip pain   . Sacroiliac pain     Medication:  Outpatient Encounter Medications as of 11/08/2018   Medication Sig Dispense Refill   . ALPRAZolam (XANAX) 0.5 mg Oral Tablet Take 1 Tab (0.5 mg total) by mouth Three times a day as needed for Insomnia 90 Tab 0   . Ca-D3-mag ox-zinc-cop-mang-bor (CALCIUM 600+D3 PLUS) 600 mg calcium- 800 unit-50 mg Oral Tablet Take by  mouth     . citalopram (CELEXA) 40 mg Oral Tablet Take 1 Tab (40 mg total) by mouth Once a day 90 Tab 3   . docusate sodium (STOOL SOFTENER) 100 mg Oral Capsule Take 100 mg by mouth Once a day     . Herbal Drugs (COLON HERBAL CLEANSER) Oral Capsule Take by mouth     . Lactobacillus acidophilus (PROBIOTIC ACIDOPHILUS) 1.5 mg (250 million cell) Oral Capsule Take by mouth     . meloxicam (MOBIC) 15 mg Oral Tablet Take 1 Tab (15 mg total) by mouth Once a day 90 Tab 3   . methocarbamoL (ROBAXIN) 500 mg Oral Tablet Take 1 Tab (500 mg total) by mouth Every night as needed 30 Tab 0     No facility-administered encounter medications on file as of 11/08/2018.        Review of Systems:  All pertinent positives in regards review systems noted HPI.  Rest review of systems unremarkable.  Exam:  Vital: BP 130/70   Pulse 83   Temp 36.4 C (97.6 F)   SpO2 96%       General: alert, cooperative, mild distress, appears stated age  Eyes: Conjunctivae/corneas clear, PERRLA.  Head - Normocephalic, without obvious abnormality, atraumatic  Neck- supple, symmetrical, trachea midline  Extremities:  Overall extremities normal, atraumatic, no cyanosis or edema.  The right ankle does show a mild amount of swelling surrounding the lateral malleolus with a 2 cm bruise here as well.  This area is  tender to palpation.  The patient does have full active range of motion of her ankle.  Pedal pulses 3/4.  Capillary refill less than 2 seconds.  Pedal push of this extremity is slightly weaker than the other due to the pain that is elicited in that region.  Skin: Skin color, texture, turgor normal. No rash.  Neurologic: alert and oriented x3.     Assessment and Plan:    ICD-10-CM    1. Sprain of right ankle, unspecified ligament, initial encounter  S93.401A    2. Acute right ankle pain  M25.571 XR ANKLE RIGHT     Medication Orders   No Medications ordered     X-ray was done today of her right ankle which showed no acute fracture dislocation.  The patient will be treated for a right ankle sprain today.  Education in teaching were provided at discharge.  She will continue her Mobic which she has on hand at home as prescribed.  supportive care discussed  ddx discussed at length  otc measures  medication as directed  recheck as needed  f/u pcp as needed/discussed  ED if worsening    Shirlee Latch, APRN,FNP-BC    Portions of this note may be dictated using voice recognition software or a dictation service. Variances in spelling and vocabulary are possible and unintentional. Not all errors are caught/corrected. Please notify the [facility]in if any discrepancies are noted or if the meaning of any statement is not clear.

## 2018-11-09 ENCOUNTER — Telehealth (INDEPENDENT_AMBULATORY_CARE_PROVIDER_SITE_OTHER): Payer: Self-pay | Admitting: Family Medicine

## 2018-11-09 NOTE — Telephone Encounter (Signed)
Ok. Thank you for the update

## 2018-11-09 NOTE — Telephone Encounter (Signed)
Pt wanted to let you know that she is seeing dr Rudene Re on November 3rd.  She is excited about that and moving forward on getting some relief for her back

## 2018-11-15 ENCOUNTER — Encounter (HOSPITAL_COMMUNITY): Payer: Self-pay

## 2018-11-15 ENCOUNTER — Other Ambulatory Visit: Payer: Self-pay

## 2018-11-15 ENCOUNTER — Emergency Department (HOSPITAL_COMMUNITY)
Admission: EM | Admit: 2018-11-15 | Discharge: 2018-11-16 | Disposition: A | Discharge status: Home / Self Care | Payer: Medicare Other | Attending: Emergency Medicine | Admitting: Emergency Medicine

## 2018-11-15 ENCOUNTER — Emergency Department (HOSPITAL_COMMUNITY): Payer: Medicare Other

## 2018-11-15 DIAGNOSIS — N3000 Acute cystitis without hematuria: Secondary | ICD-10-CM | POA: Insufficient documentation

## 2018-11-15 DIAGNOSIS — Z87891 Personal history of nicotine dependence: Secondary | ICD-10-CM | POA: Insufficient documentation

## 2018-11-15 DIAGNOSIS — I10 Essential (primary) hypertension: Secondary | ICD-10-CM | POA: Diagnosis not present

## 2018-11-15 DIAGNOSIS — Z79899 Other long term (current) drug therapy: Secondary | ICD-10-CM | POA: Insufficient documentation

## 2018-11-15 DIAGNOSIS — R062 Wheezing: Secondary | ICD-10-CM | POA: Insufficient documentation

## 2018-11-15 MED ORDER — ALBUTEROL SULFATE HFA 108 (90 BASE) MCG/ACT IN AERS
2.0000 | INHALATION_SPRAY | Freq: Once | RESPIRATORY_TRACT | Status: AC
  Administered 2018-11-15: 22:00:00 2 via RESPIRATORY_TRACT
  Filled 2018-11-15: qty 6.7

## 2018-11-15 NOTE — ED Notes (Signed)
Pt brought back to treatment room for reassessment. Pt reporting she had an episode in the lobby and she needs her inhaler. Saturations 99% on RA, does have some wheezing throughout, will obtain order for inhaler.

## 2018-11-15 NOTE — ED Notes (Signed)
WALKED OUT TO SEE HER HUSBAND IN CAR

## 2018-11-15 NOTE — ED Triage Notes (Signed)
Pt reports her BP was 187/100 around 5pm. Pt reports her head started hurting a little bit while waiting in the lobby. Pt denies any confusion, weakness, or numbness. Pt has equal grips and strength in all extremities. Pt has no loss of sensation. No facial droop on assessment. Pt denies chest pain.

## 2018-11-15 NOTE — ED Notes (Signed)
RETURNED TO [facility]

## 2018-11-16 ENCOUNTER — Other Ambulatory Visit (HOSPITAL_COMMUNITY): Payer: Self-pay

## 2018-11-16 ENCOUNTER — Encounter (HOSPITAL_COMMUNITY): Payer: Self-pay | Admitting: Emergency Medicine

## 2018-11-16 LAB — I-STAT CHEM 8, ED
BUN: 14 mg/dL (ref 8–23)
Calcium, Ion: 1.27 mmol/L (ref 1.15–1.40)
Chloride: 103 mmol/L (ref 98–111)
Creatinine, Ser: 0.7 mg/dL (ref 0.44–1.00)
Glucose, Bld: 111 mg/dL — ABNORMAL HIGH (ref 70–99)
HCT: 44 % (ref 36.0–46.0)
Hemoglobin: 15 g/dL (ref 12.0–15.0)
Potassium: 4.2 mmol/L (ref 3.5–5.1)
Sodium: 142 mmol/L (ref 135–145)
TCO2: 25 mmol/L (ref 22–32)

## 2018-11-16 LAB — URINALYSIS, ROUTINE W REFLEX MICROSCOPIC
Bilirubin Urine: NEGATIVE
Glucose, UA: NEGATIVE mg/dL
Hgb urine dipstick: NEGATIVE
Ketones, ur: 20 mg/dL — AB
Nitrite: NEGATIVE
Protein, ur: NEGATIVE mg/dL
Specific Gravity, Urine: 1.01 (ref 1.005–1.030)
pH: 6 (ref 5.0–8.0)

## 2018-11-16 MED ORDER — FOSFOMYCIN TROMETHAMINE 3 G PO PACK
3.0000 g | PACK | Freq: Once | ORAL | Status: AC
  Administered 2018-11-16: 3 g via ORAL
  Filled 2018-11-16: qty 3

## 2018-11-16 NOTE — ED Provider Notes (Addendum)
[facility] DEPT Provider Note   CSN: [id number] Arrival date & time: 11/15/18  1928     History   Chief Complaint Chief Complaint  Patient presents with  . Hypertension    HPI Erin Perry is a 78 y.o. female.     The history is provided by the patient.  Hypertension This is a recurrent problem. The current episode started 12 to 24 hours ago. The problem occurs constantly. The problem has not changed since onset.Pertinent negatives include no chest pain, no abdominal pain and no headaches. Associated symptoms comments: Started wheezing while waiting and did not bring her inhaler.  . Nothing aggravates the symptoms. Nothing relieves the symptoms. She has tried nothing for the symptoms. The treatment provided no relief.    Past Medical History:  Diagnosis Date  . Anxiety   . Asthma   . Multinodular goiter     Patient Active Problem List   Diagnosis Date Noted  . Anxiety   . Multinodular goiter     History reviewed. No pertinent surgical history.   OB History   No obstetric history on file.      Home Medications    Prior to Admission medications   Medication Sig Start Date End Date Taking? Authorizing Provider  albuterol (VENTOLIN HFA) 108 (90 Base) MCG/ACT inhaler Inhale 1 puff into the lungs every 6 (six) hours as needed for wheezing or shortness of breath.   Yes [provider]  budesonide-formoterol (SYMBICORT) 160-4.5 MCG/ACT inhaler Inhale 2 puffs into the lungs 2 (two) times daily. 10/08/15  Yes [provider]  busPIRone (BUSPAR) 5 MG tablet Take 5 mg by mouth 2 (two) times daily as needed (anxiety).  10/08/15  Yes [provider]  Calcium Carb-Cholecalciferol (CALCIUM 600 + D) 600-200 MG-UNIT TABS Take 1 tablet by mouth 2 (two) times daily.   Yes [provider]  Cholecalciferol 100 MCG (4000 UT) CAPS Take 4,000 Units by mouth daily.   Yes [provider]  hydrochlorothiazide  (MICROZIDE) 12.5 MG capsule Take 1 capsule (12.5 mg total) by mouth daily. Patient taking differently: Take 12.5 mg by mouth daily as needed (BP).  10/19/18  Yes Carlisle Cater, PA-C  hydroxypropyl methylcellulose / hypromellose (ISOPTO TEARS / GONIOVISC) 2.5 % ophthalmic solution Place 1 drop into both eyes as needed for dry eyes.   Yes [provider]  Multiple Vitamins-Minerals (MULTIVITAMIN ADULT PO) Take 1 tablet by mouth daily.    Yes [provider]  Multiple Vitamins-Minerals (PRESERVISION AREDS 2 PO) Take 1 tablet by mouth 2 (two) times daily.   Yes [provider]  Omega-3 Fatty Acids (FISH OIL) 1200 MG CAPS Take 1 capsule by mouth daily.   Yes [provider]  vitamin C (ASCORBIC ACID) 500 MG tablet Take 500 mg by mouth 2 (two) times daily.   Yes [provider]    Family History Family History  Problem Relation Age of Onset  . Thyroid disease Neg Hx     Social History Social History   Tobacco Use  . Smoking status: Former Research scientist (life sciences)  . Smokeless tobacco: Never Used  Substance Use Topics  . Alcohol use: Yes  . Drug use: Not on file     Allergies   Ceftin  [cefuroxime axetil], Clarithromycin, Gabapentin, Talwin [pentazocine], Fluocinolone, Prednisone, Amoxicillin, Ibuprofen, Iodinated diagnostic agents, Naproxen, Other, Penicillins, Sulfa antibiotics, Tequin [gatifloxacin], Doxycycline, and Zoloft [sertraline hcl]   Review of Systems Review of Systems  Constitutional: Negative for fever.  HENT: Negative for congestion.   Eyes: Negative for visual disturbance.  Respiratory: Positive for wheezing. Negative for chest tightness.   Cardiovascular: Negative for chest pain.  Gastrointestinal: Negative for abdominal pain.  Endocrine: Negative for polyuria.  Genitourinary: Negative for difficulty urinating.  Musculoskeletal: Negative for arthralgias.  Neurological: Negative for dizziness, seizures, facial asymmetry, speech difficulty,  light-headedness, numbness and headaches.  Psychiatric/Behavioral: Negative for agitation.  All other systems reviewed and are negative.    Physical Exam Updated Vital Signs BP 134/81   Pulse 86   Temp 98.2 F (36.8 C) (Oral)   Resp 18   Ht  (1.651 m)   Wt 63.2 kg   SpO2 97%   BMI 23.18 kg/m   Physical Exam Vitals signs and nursing note reviewed.  Constitutional:      General: She is not in acute distress.    Appearance: She is normal weight.  HENT:     Head: Normocephalic and atraumatic.     Nose: Nose normal.     Mouth/Throat:     Mouth: Mucous membranes are moist.     Pharynx: Oropharynx is clear.  Eyes:     Extraocular Movements: Extraocular movements intact.     Conjunctiva/sclera: Conjunctivae normal.     Pupils: Pupils are equal, round, and reactive to light.  Neck:     Musculoskeletal: Normal range of motion and neck supple.  Cardiovascular:     Rate and Rhythm: Normal rate and regular rhythm.     Pulses: Normal pulses.     Heart sounds: Normal heart sounds.  Pulmonary:     Effort: Pulmonary effort is normal.     Breath sounds: Normal breath sounds.     Comments: Had been given MDI prior to exam Abdominal:     General: Abdomen is flat. Bowel sounds are normal.     Tenderness: There is no abdominal tenderness. There is no guarding.  Musculoskeletal: Normal range of motion.        General: No swelling.     Right lower leg: No edema.     Left lower leg: No edema.  Skin:    General: Skin is warm and dry.     Capillary Refill: Capillary refill takes less than 2 seconds.  Neurological:     General: No focal deficit present.     Mental Status: She is alert and oriented to person, place, and time.  Psychiatric:     Comments: anxious      ED Treatments / Results  Labs (all labs ordered are listed, but only abnormal results are displayed) Results for orders placed or performed during the hospital encounter of 11/15/18  Urinalysis, Routine w reflex  microscopic  Result Value Ref Range   Color, Urine STRAW (A) YELLOW   APPearance CLEAR CLEAR   Specific Gravity, Urine 1.010 1.005 - 1.030   pH 6.0 5.0 - 8.0   Glucose, UA NEGATIVE NEGATIVE mg/dL   Hgb urine dipstick NEGATIVE NEGATIVE   Bilirubin Urine NEGATIVE NEGATIVE   Ketones, ur 20 (A) NEGATIVE mg/dL   Protein, ur NEGATIVE NEGATIVE mg/dL   Nitrite NEGATIVE NEGATIVE   Leukocytes,Ua LARGE (A) NEGATIVE   RBC / HPF 0-5 0 - 5 RBC/hpf   WBC, UA 21-50 0 - 5 WBC/hpf   Bacteria, UA RARE (A) NONE SEEN   Squamous Epithelial / LPF 0-5 0 - 5   Mucus PRESENT   I-stat chem 8, ED (not at [facility] or [facility])  Result Value Ref Range  Sodium 142 135 - 145 mmol/L   Potassium 4.2 3.5 - 5.1 mmol/L   Chloride 103 98 - 111 mmol/L   BUN 14 8 - 23 mg/dL   Creatinine, Ser 4.090.70 0.44 - 1.00 mg/dL   Glucose, Bld 811111 (H) 70 - 99 mg/dL   Calcium, Ion 9.141.27 7.821.15 - 1.40 mmol/L   TCO2 25 22 - 32 mmol/L   Hemoglobin 15.0 12.0 - 15.0 g/dL   HCT 95.644.0 21.336.0 - 08.646.0 %   Dg Chest 2 View  Result Date: 11/16/2018 CLINICAL DATA:  Wheezing EXAM: CHEST - 2 VIEW COMPARISON:  10/19/2018 FINDINGS: Hyperinflated lungs with bronchitic changes. No acute consolidation, pleural effusion, or pneumothorax. Stable mild wedging of midthoracic vertebra. Stable cardiomediastinal silhouette. No pneumothorax. IMPRESSION: Stable hyperinflation and bronchitic changes. No acute airspace disease. Electronically Signed   By: Jasmine PangKim  Fujinaga M.D.   On: 11/16/2018 00:03   Dg Chest 2 View  Result Date: 10/19/2018 CLINICAL DATA:  Chest pain EXAM: CHEST - 2 VIEW COMPARISON:  None. FINDINGS: The heart size and mediastinal contours are within normal limits. There is hyperinflation of the lung zones with flattening of the hemidiaphragms. Mildly increased interstitial markings seen in both lung apices with mild biapical scarring. No large airspace consolidation. There is diffuse osteopenia and degenerative changes in the midthoracic spine. IMPRESSION: Mildly  increased interstitial markings at both lung apices, likely chronic changes. Findings of COPD Electronically Signed   By: Jonna ClarkBindu  Avutu M.D.   On: 10/19/2018 11:59    Radiology Dg Chest 2 View  Result Date: 11/16/2018 CLINICAL DATA:  Wheezing EXAM: CHEST - 2 VIEW COMPARISON:  10/19/2018 FINDINGS: Hyperinflated lungs with bronchitic changes. No acute consolidation, pleural effusion, or pneumothorax. Stable mild wedging of midthoracic vertebra. Stable cardiomediastinal silhouette. No pneumothorax. IMPRESSION: Stable hyperinflation and bronchitic changes. No acute airspace disease. Electronically Signed   By: Jasmine PangKim  Fujinaga M.D.   On: 11/16/2018 00:03    Procedures Procedures (including critical care time)  Medications Ordered in ED Medications  fosfomycin (MONUROL) packet 3 g (has no administration in time range)  albuterol (VENTOLIN HFA) 108 (90 Base) MCG/ACT inhaler 2 puff (2 puffs Inhalation Given 11/15/18 2132)    BP improved without intervention.  She is aymptomatic.  No CP, no DOE, no leg pain or swelling no neurologic symptoms.  The patient needs to start taking her home BP medication and follow up with her PMD for full evaluation and additional testing.  She has been informed of this and expresses understanding.    Slight UTI will treat for same, given allergies has been given fosfomycin in the ED.  No elevation of creatinine no proteinuria.  Lungs are now clear.    Erin Perry was evaluated in Emergency Department on 11/16/2018 for the symptoms described in the history of present illness. She was evaluated in the context of the global COVID-19 pandemic, which necessitated consideration that the patient might be at risk for infection with the SARS-CoV-2 virus that causes COVID-19. Institutional protocols and algorithms that pertain to the evaluation of patients at risk for COVID-19 are in a state of rapid change based on information released by regulatory bodies including the CDC and  federal and state organizations. These policies and algorithms were followed during the patient's care in the ED.   Final Clinical Impressions(s) / ED Diagnoses   Return for weakness, numbness, changes in vision or speech, fevers >100.4 unrelieved by medication, shortness of breath, intractable vomiting, or diarrhea, abdominal pain, Inability to tolerate liquids or  food, cough, altered mental status or any concerns. No signs of systemic illness or infection. The patient is nontoxic-appearing on exam and vital signs are within normal limits.   I have reviewed the triage vital signs and the nursing notes. Pertinent labs &imaging results that were available during my care of the patient were reviewed by me and considered in my medical decision making (see chart for details).  After history, exam, and medical workup I feel the patient has been appropriately medically screened and is safe for discharge home. Pertinent diagnoses were discussed with the patient. Patient was given return precautions.      Elmina Hendel, MD 11/16/18 Shirlee Latch, Kawanna Christley, MD 11/16/18 [id number]

## 2018-11-17 ENCOUNTER — Other Ambulatory Visit (INDEPENDENT_AMBULATORY_CARE_PROVIDER_SITE_OTHER): Payer: Self-pay | Admitting: Family Medicine

## 2018-11-17 MED ORDER — MELOXICAM 15 MG TABLET
15.0000 mg | ORAL_TABLET | Freq: Every day | ORAL | 3 refills | Status: DC
Start: 2018-11-17 — End: 2019-11-12

## 2018-11-20 ENCOUNTER — Other Ambulatory Visit: Payer: Self-pay

## 2018-11-20 ENCOUNTER — Emergency Department (HOSPITAL_COMMUNITY)
Admission: EM | Admit: 2018-11-20 | Discharge: 2018-11-21 | Disposition: A | Discharge status: Home / Self Care | Payer: Medicare Other | Attending: Emergency Medicine | Admitting: Emergency Medicine

## 2018-11-20 ENCOUNTER — Encounter (HOSPITAL_COMMUNITY): Payer: Self-pay | Admitting: *Deleted

## 2018-11-20 DIAGNOSIS — I10 Essential (primary) hypertension: Secondary | ICD-10-CM

## 2018-11-20 DIAGNOSIS — Z79899 Other long term (current) drug therapy: Secondary | ICD-10-CM | POA: Insufficient documentation

## 2018-11-20 DIAGNOSIS — Z87891 Personal history of nicotine dependence: Secondary | ICD-10-CM | POA: Diagnosis not present

## 2018-11-20 DIAGNOSIS — J45909 Unspecified asthma, uncomplicated: Secondary | ICD-10-CM | POA: Insufficient documentation

## 2018-11-20 NOTE — ED Triage Notes (Addendum)
Pt fractured her back  a few weeks ago. Since being discharged has noticed her bp has been elevated. PCP recommended pt take her bp TID. Today pt started feeling tightness in her head, took her VS, bp 224/109; husband drove pt to the EMS bay. Pt has been given hctz prescription and told not to take medication unless bp is above 709 systolic for more than 24 hours. Typical bp per patient is 628Z systolic. Denies pressure in head. Took 81mg  ASA pta

## 2018-11-21 NOTE — ED Notes (Signed)
Patient verbalizes understanding of discharge instructions. Opportunity for questioning and answers were provided. Armband removed by staff, pt discharged from ED.  

## 2018-11-21 NOTE — ED Provider Notes (Signed)
[facility] EMERGENCY DEPARTMENT Provider Note   CSN: [id number] Arrival date & time: 11/20/18  1946     History   Chief Complaint Chief Complaint  Patient presents with  . Hypertension    HPI Erin Perry is a 78 y.o. female.     HPI  This is a 78 year old female with a history of asthma and anxiety who presents with high blood pressure.  She is a recent history of high blood pressure readings at home after breaking her back.  She reports that sometimes during the day her blood pressures completely normal 110s over 70s and then it will spike.  She has been seen and evaluated multiple times in the emergency room as well as by her primary physician.  She has had evaluation to include blood work and EKG which has been reassuring.  She has cardiology follow-up on the 22nd.  Patient reports that tonight her blood pressure readings were in the 220s over 100s at home.  She did report some head pressure at that time.  She went to the EMS station and was transported here.  Currently she is asymptomatic.  She did not have any chest pain, shortness of breath, abdominal pain, nausea, vomiting.  She states that she is not in any pain when she noted her blood pressure to be that high.  I have reviewed her chart.  She has been seen twice in the emergency department and at least twice by her primary physician for similar symptoms.  She was prescribed HCTZ in the emergency department and carvedilol by her primary physician but has not started either of these medications because "my blood pressure is usually normal."  Past Medical History:  Diagnosis Date  . Anxiety   . Asthma   . Multinodular goiter     Patient Active Problem List   Diagnosis Date Noted  . Anxiety   . Multinodular goiter     History reviewed. No pertinent surgical history.   OB History   No obstetric history on file.      Home Medications    Prior to Admission medications   Medication Sig Start  Date End Date Taking? Authorizing Provider  albuterol (VENTOLIN HFA) 108 (90 Base) MCG/ACT inhaler Inhale 1 puff into the lungs every 6 (six) hours as needed for wheezing or shortness of breath.    [provider]  budesonide-formoterol (SYMBICORT) 160-4.5 MCG/ACT inhaler Inhale 2 puffs into the lungs 2 (two) times daily. 10/08/15   [provider]  busPIRone (BUSPAR) 5 MG tablet Take 5 mg by mouth 2 (two) times daily as needed (anxiety).  10/08/15   [provider]  Calcium Carb-Cholecalciferol (CALCIUM 600 + D) 600-200 MG-UNIT TABS Take 1 tablet by mouth 2 (two) times daily.    [provider]  Cholecalciferol 100 MCG (4000 UT) CAPS Take 4,000 Units by mouth daily.    [provider]  hydrochlorothiazide (MICROZIDE) 12.5 MG capsule Take 1 capsule (12.5 mg total) by mouth daily. Patient taking differently: Take 12.5 mg by mouth daily as needed (BP).  10/19/18   Renne Crigler, PA-C  hydroxypropyl methylcellulose / hypromellose (ISOPTO TEARS / GONIOVISC) 2.5 % ophthalmic solution Place 1 drop into both eyes as needed for dry eyes.    [provider]  Multiple Vitamins-Minerals (MULTIVITAMIN ADULT PO) Take 1 tablet by mouth daily.     [provider]  Multiple Vitamins-Minerals (PRESERVISION AREDS 2 PO) Take 1 tablet by mouth 2 (two) times daily.  [provider]  Omega-3 Fatty Acids (FISH OIL) 1200 MG CAPS Take 1 capsule by mouth daily.    [provider]  vitamin C (ASCORBIC ACID) 500 MG tablet Take 500 mg by mouth 2 (two) times daily.    [provider]    Family History Family History  Problem Relation Age of Onset  . Thyroid disease Neg Hx     Social History Social History   Tobacco Use  . Smoking status: Former Research scientist (life sciences)  . Smokeless tobacco: Never Used  Substance Use Topics  . Alcohol use: Yes  . Drug use: Not on file     Allergies   Ceftin  [cefuroxime axetil], Clarithromycin, Gabapentin,  Talwin [pentazocine], Fluocinolone, Prednisone, Amoxicillin, Ibuprofen, Iodinated diagnostic agents, Naproxen, Other, Penicillins, Sulfa antibiotics, Tequin [gatifloxacin], Doxycycline, and Zoloft [sertraline hcl]   Review of Systems Review of Systems  Constitutional: Negative for fever.  Respiratory: Negative for shortness of breath.   Cardiovascular: Negative for chest pain.  Gastrointestinal: Negative for abdominal pain, nausea and vomiting.  Genitourinary: Negative for dysuria.  Neurological: Positive for headaches.  All other systems reviewed and are negative.    Physical Exam Updated Vital Signs BP (!) 174/95 (BP Location: Left Arm)   Pulse 92   Temp 97.9 F (36.6 C) (Oral)   Resp 16   SpO2 97%   Physical Exam Vitals signs and nursing note reviewed.  Constitutional:      Appearance: She is well-developed. She is not ill-appearing.  HENT:     Head: Normocephalic and atraumatic.     Mouth/Throat:     Mouth: Mucous membranes are moist.  Eyes:     Extraocular Movements: Extraocular movements intact.     Pupils: Pupils are equal, round, and reactive to light.  Neck:     Musculoskeletal: Neck supple.  Cardiovascular:     Rate and Rhythm: Normal rate and regular rhythm.     Heart sounds: Normal heart sounds.  Pulmonary:     Effort: Pulmonary effort is normal. No respiratory distress.     Breath sounds: No wheezing.  Abdominal:     General: Bowel sounds are normal.     Palpations: Abdomen is soft.  Musculoskeletal:     Right lower leg: No edema.     Left lower leg: No edema.     Comments: Back brace in place  Skin:    General: Skin is warm and dry.  Neurological:     Mental Status: She is alert and oriented to person, place, and time.  Psychiatric:        Mood and Affect: Mood normal.      ED Treatments / Results  Labs (all labs ordered are listed, but only abnormal results are displayed) Labs Reviewed - No data to display  EKG None  Radiology No  results found.  Procedures Procedures (including critical care time)  Medications Ordered in ED Medications - No data to display   Initial Impression / Assessment and Plan / ED Course  I have reviewed the triage vital signs and the nursing notes.  Pertinent labs & imaging results that were available during my care of the patient were reviewed by me and considered in my medical decision making (see chart for details).        Patient presents with issues with blood pressure.  Blood pressure has down trended since being at home.  She is currently asymptomatic.  I have reviewed her chart extensively.  She has had several work-ups including  basic lab work, EKG in the past.  She is neurologically intact.  She was last seen in the emergency department on the 15th.  I had a long discussion with the patient and her husband.  She has follow-up with cardiology on Thursday.  I highly recommended that she initiate HCTZ to see if this prevented any spike.  At this time I have low suspicion for hypertensive emergency or urgency and do not feel she needs repeat work-up as she is neurologically intact and without symptoms at this time.  Patient is agreeable to this plan.  After history, exam, and medical workup I feel the patient has been appropriately medically screened and is safe for discharge home. Pertinent diagnoses were discussed with the patient. Patient was given return precautions.   Final Clinical Impressions(s) / ED Diagnoses   Final diagnoses:  Essential hypertension    ED Discharge Orders    None       Horton, Mayer Maskerourtney F, MD 11/21/18 [id number]

## 2018-11-21 NOTE — Discharge Instructions (Addendum)
You were seen today with concerns for hypertension.  Your blood pressure trended downward without any intervention.  You should start HCTZ 12.5 mg daily and follow-up with cardiology as scheduled on the 22nd.  Make sure that your home blood pressure monitor is calibrated.  If you develop symptoms in the setting of high blood pressure, this is reason to be reevaluated.

## 2018-11-21 NOTE — ED Notes (Signed)
Pt stated she doesn't want to get into a hospital gown b/c she doesn't feel anything will need to be done b/c her blood pressure is fine now.

## 2018-11-23 ENCOUNTER — Emergency Department (HOSPITAL_COMMUNITY)
Admission: EM | Admit: 2018-11-23 | Discharge: 2018-11-24 | Disposition: A | Discharge status: Home / Self Care | Payer: Medicare Other | Attending: Emergency Medicine | Admitting: Emergency Medicine

## 2018-11-23 ENCOUNTER — Other Ambulatory Visit: Payer: Self-pay

## 2018-11-23 DIAGNOSIS — Z91041 Radiographic dye allergy status: Secondary | ICD-10-CM | POA: Diagnosis not present

## 2018-11-23 DIAGNOSIS — R000 Tachycardia, unspecified: Secondary | ICD-10-CM | POA: Diagnosis not present

## 2018-11-23 DIAGNOSIS — Z888 Allergy status to other drugs, medicaments and biological substances status: Secondary | ICD-10-CM | POA: Insufficient documentation

## 2018-11-23 DIAGNOSIS — Z882 Allergy status to sulfonamides status: Secondary | ICD-10-CM | POA: Insufficient documentation

## 2018-11-23 DIAGNOSIS — Z880 Allergy status to penicillin: Secondary | ICD-10-CM | POA: Insufficient documentation

## 2018-11-23 DIAGNOSIS — Z881 Allergy status to other antibiotic agents status: Secondary | ICD-10-CM | POA: Insufficient documentation

## 2018-11-23 DIAGNOSIS — I10 Essential (primary) hypertension: Secondary | ICD-10-CM | POA: Insufficient documentation

## 2018-11-23 DIAGNOSIS — Z87891 Personal history of nicotine dependence: Secondary | ICD-10-CM | POA: Insufficient documentation

## 2018-11-23 DIAGNOSIS — Z79899 Other long term (current) drug therapy: Secondary | ICD-10-CM | POA: Insufficient documentation

## 2018-11-24 ENCOUNTER — Emergency Department (HOSPITAL_COMMUNITY): Payer: Medicare Other

## 2018-11-24 ENCOUNTER — Other Ambulatory Visit: Payer: Self-pay

## 2018-11-24 ENCOUNTER — Encounter (HOSPITAL_COMMUNITY): Payer: Self-pay | Admitting: Emergency Medicine

## 2018-11-24 LAB — CBC
HCT: 45.7 % (ref 36.0–46.0)
Hemoglobin: 14.9 g/dL (ref 12.0–15.0)
MCH: 31.4 pg (ref 26.0–34.0)
MCHC: 32.6 g/dL (ref 30.0–36.0)
MCV: 96.2 fL (ref 80.0–100.0)
Platelets: 280 10*3/uL (ref 150–400)
RBC: 4.75 MIL/uL (ref 3.87–5.11)
RDW: 12.3 % (ref 11.5–15.5)
WBC: 6.5 10*3/uL (ref 4.0–10.5)
nRBC: 0 % (ref 0.0–0.2)

## 2018-11-24 LAB — BASIC METABOLIC PANEL
Anion gap: 15 (ref 5–15)
BUN: 12 mg/dL (ref 8–23)
CO2: 26 mmol/L (ref 22–32)
Calcium: 9.9 mg/dL (ref 8.9–10.3)
Chloride: 97 mmol/L — ABNORMAL LOW (ref 98–111)
Creatinine, Ser: 0.91 mg/dL (ref 0.44–1.00)
GFR calc Af Amer: >60 mL/min (ref >60)
GFR calc non Af Amer: >60 mL/min (ref >60)
Glucose, Bld: 137 mg/dL — ABNORMAL HIGH (ref 70–99)
Potassium: 3.5 mmol/L (ref 3.5–5.1)
Sodium: 138 mmol/L (ref 135–145)

## 2018-11-24 LAB — TROPONIN I (HIGH SENSITIVITY)
Troponin I (High Sensitivity): 10 ng/L (ref <18)
Troponin I (High Sensitivity): 9 ng/L (ref <18)

## 2018-11-24 MED ORDER — SODIUM CHLORIDE 0.9% FLUSH: 3.0000 mL | Freq: Once | INTRAVENOUS | Status: DC

## 2018-11-24 NOTE — Discharge Instructions (Addendum)
You have been seen today for fast heart rate and high blood pressure. Please read and follow all provided instructions. Return to the emergency room for worsening condition or new concerning symptoms.    Your work up today is reassuring- Your labs do not show any damage to your kidneys or signs of infection.  1. Medications:  Continue usual home medications. Please take your blood pressure medications are prescribed.   2. Treatment: rest, drink plenty of fluids  3. Follow Up: Please follow up with your primary doctor in 2-5 days for discussion of your diagnoses and further evaluation after today's visit; Call today to arrange your follow up.  -also recommend you follow up with cardiology as needed  ?

## 2018-11-24 NOTE — ED Provider Notes (Signed)
[facility] EMERGENCY DEPARTMENT [facility]   CSN: [id number] Arrival date & time: 11/23/18  2348     History   Chief Complaint Chief Complaint  Patient presents with   Tachycardia   Hypertension    HPI Erin Perry is a 78 y.o. female with past medical history significant for anxiety, asthma, multinodular goiter presents to emergency department today with chief complaint of hypertension and tachycardia. Onset was acute starting just prior to arrival. Pt states she was sitting at home watching television when her husband recommended she check her blood pressure. It was 204/114 with heart rate of 134. She denies any symptoms and was feeling like her normal self. She called her pcp office and RN advised ED evaluation. She denies fever, chills, visual changes, headache, chest pain, palpitations, shortness of breath, lower extremity edema, urinary symptoms.   Pt is prescribed HCTZ 25mg .   Chart review shows pt had appointment with [facility] cardiologist Dr. Boneta Luckshinehart yesterday. She is currently taking HCTZ and cardiologist added Coreg 3.125mg  PO BID. Pt reports she had not yet started  This medication and was unaware she was supposed to. She also started wearing Holter monitor yesterday with plan to turn it in in 1 week.    Past Medical History:  Diagnosis Date   Anxiety    Asthma    Multinodular goiter     Patient Active Problem List   Diagnosis Date Noted   Anxiety    Multinodular goiter     History reviewed. No pertinent surgical history.   OB History   No obstetric history on file.      Home Medications    Prior to Admission medications   Medication Sig Start Date End Date Taking? Authorizing Provider  albuterol (VENTOLIN HFA) 108 (90 Base) MCG/ACT inhaler Inhale 1 puff into the lungs every 6 (six) hours as needed for wheezing or shortness of breath.    [provider]  budesonide-formoterol (SYMBICORT) 160-4.5 MCG/ACT inhaler  Inhale 2 puffs into the lungs 2 (two) times daily. 10/08/15   [provider]  busPIRone (BUSPAR) 5 MG tablet Take 5 mg by mouth 2 (two) times daily as needed (anxiety).  10/08/15   [provider]  Calcium Carb-Cholecalciferol (CALCIUM 600 + D) 600-200 MG-UNIT TABS Take 1 tablet by mouth 2 (two) times daily.    [provider]  Cholecalciferol 100 MCG (4000 UT) CAPS Take 4,000 Units by mouth daily.    [provider]  hydrochlorothiazide (MICROZIDE) 12.5 MG capsule Take 1 capsule (12.5 mg total) by mouth daily. Patient taking differently: Take 12.5 mg by mouth daily as needed (BP).  10/19/18   Renne CriglerGeiple, Joshua, PA-C  hydroxypropyl methylcellulose / hypromellose (ISOPTO TEARS / GONIOVISC) 2.5 % ophthalmic solution Place 1 drop into both eyes as needed for dry eyes.    [provider]  Multiple Vitamins-Minerals (MULTIVITAMIN ADULT PO) Take 1 tablet by mouth daily.     [provider]  Multiple Vitamins-Minerals (PRESERVISION AREDS 2 PO) Take 1 tablet by mouth 2 (two) times daily.    [provider]  Omega-3 Fatty Acids (FISH OIL) 1200 MG CAPS Take 1 capsule by mouth daily.    [provider]  vitamin C (ASCORBIC ACID) 500 MG tablet Take 500 mg by mouth 2 (two) times daily.    [provider]    Family History Family History  Problem Relation Age of Onset   Thyroid disease Neg Hx     Social History Social  History   Tobacco Use   Smoking status: Former Smoker   Smokeless tobacco: Never Used  Substance Use Topics   Alcohol use: Yes   Drug use: Not on file     Allergies   Ceftin  [cefuroxime axetil], Clarithromycin, Gabapentin, Talwin [pentazocine], Fluocinolone, Prednisone, Amoxicillin, Ibuprofen, Iodinated diagnostic agents, Naproxen, Other, Penicillins, Sulfa antibiotics, Tequin [gatifloxacin], Doxycycline, and Zoloft [sertraline hcl]   Review of Systems Review of Systems  Constitutional: Negative for  chills and fever.  HENT: Negative for congestion, ear discharge, ear pain, sinus pressure, sinus pain and sore throat.   Eyes: Negative for pain and redness.  Respiratory: Negative for cough and shortness of breath.   Cardiovascular: Negative for chest pain, palpitations and leg swelling.  Gastrointestinal: Negative for abdominal pain, constipation, diarrhea, nausea and vomiting.  Genitourinary: Negative for dysuria and hematuria.  Musculoskeletal: Negative for back pain and neck pain.  Skin: Negative for wound.  Neurological: Negative for weakness, numbness and headaches.     Physical Exam Updated Vital Signs BP (!) 137/92    Pulse 91    Temp 98 F (36.7 C) (Oral)    Resp (!) 25    SpO2 95%   Physical Exam Vitals signs and nursing note reviewed.  Constitutional:      General: She is not in acute distress.    Appearance: She is not ill-appearing.  HENT:     Head: Normocephalic and atraumatic.     Right Ear: Tympanic membrane and external ear normal.     Left Ear: Tympanic membrane and external ear normal.     Nose: Nose normal.     Mouth/Throat:     Mouth: Mucous membranes are moist.     Pharynx: Oropharynx is clear.  Eyes:     General: No scleral icterus.       Right eye: No discharge.        Left eye: No discharge.     Extraocular Movements: Extraocular movements intact.     Conjunctiva/sclera: Conjunctivae normal.     Pupils: Pupils are equal, round, and reactive to light.  Neck:     Musculoskeletal: Normal range of motion.     Vascular: No JVD.  Cardiovascular:     Rate and Rhythm: Normal rate and regular rhythm.     Pulses: Normal pulses.          Radial pulses are 2+ on the right side and 2+ on the left side.     Heart sounds: Normal heart sounds.  Pulmonary:     Comments: Lungs clear to auscultation in all fields. Symmetric chest rise. No wheezing, rales, or rhonchi. Chest:     Chest wall: No tenderness.  Abdominal:     Comments: Abdomen is soft,  non-distended, and non-tender in all quadrants. No rigidity, no guarding. No peritoneal signs.  Musculoskeletal: Normal range of motion.  Skin:    General: Skin is warm and dry.     Capillary Refill: Capillary refill takes less than 2 seconds.  Neurological:     Mental Status: She is oriented to person, place, and time.     GCS: GCS eye subscore is 4. GCS verbal subscore is 5. GCS motor subscore is 6.     Comments: Fluent speech, no facial droop.  Psychiatric:        Mood and Affect: Mood is anxious.        Behavior: Behavior normal.      ED Treatments / Results  Labs (all labs ordered  are listed, but only abnormal results are displayed) Labs Reviewed  BASIC METABOLIC PANEL - Abnormal; Notable for the following components:      Result Value   Chloride 97 (*)    Glucose, Bld 137 (*)    All other components within normal limits  CBC  TROPONIN I (HIGH SENSITIVITY)  TROPONIN I (HIGH SENSITIVITY)    EKG EKG Interpretation  Date/Time:  Friday November 24 2018 09:09:43 EDT Ventricular Rate:  86 PR Interval:    QRS Duration: 83 QT Interval:  364 QTC Calculation: 436 R Axis:   48 Text Interpretation:  Sinus rhythm Borderline low voltage, extremity leads No significant change since last tracing Confirmed by Isla Pence [phone]) on 11/24/2018 9:22:24 AM   Radiology Dg Chest 2 View  Result Date: 11/24/2018 CLINICAL DATA:  Tachycardia. High blood pressure. EXAM: CHEST - 2 VIEW COMPARISON:  Radiograph 11/15/2018 10/19/2018 FINDINGS: Chronic hyperinflation bronchial thickening. Biapical pleuroparenchymal scarring. No focal airspace disease. Unchanged heart size and mediastinal contours. Retrocardiac lucency secondary to hyperinflation. No pulmonary edema, pleural fluid or pneumothorax. Overlying cardiac monitor in place. Degenerative change in the spine with stable mid anterior wedging. No acute osseous abnormality. IMPRESSION: Chronic hyperinflation and bronchial thickening. No  superimposed acute abnormality. Electronically Signed   By: Keith Rake M.D.   On: 11/24/2018 01:30    Procedures Procedures (including critical care time)  Medications Ordered in ED Medications  sodium chloride flush (NS) 0.9 % injection 3 mL (has no administration in time range)     Initial Impression / Assessment and Plan / ED Course  I have reviewed the triage vital signs and the nursing notes.  Pertinent labs & imaging results that were available during my care of the patient were reviewed by me and considered in my medical decision making (see chart for details).  Patient noted to be hypertensive in triage with BP 153/118, tachycardic to 104. This is patient's 3rd ED visit for the same. Labs, chest xray, and EKG were ordered in triage.  I viewed results and all are unremarkable. Negative delta troponin, no leukocytosis, no anemia, no electrolyte derangements, no renal insufficiencies. Chest xray viewed by me shows chronic hyperinflation and bronchial thickening, no acute findings. EKG without ischemic changes, unchanged from prior.   On exam pt is well appearing. Her blood pressure has improved and is 145/88 during exam, no tachycardia. She appears very anxious. No signs of hypertensive urgency or emergency.  Discussed with patient the need for close follow-up and management by their primary care physician. Also recommend she call cardiologist to confirm medications as she is unsure of coreg. The patient was discussed with and seen by Dr. Gilford Raid who agrees with the treatment plan.   Portions of this note were generated with [employer]. Dictation errors may occur despite best attempts at proofreading.   Final Clinical Impressions(s) / ED Diagnoses   Final diagnoses:  Essential hypertension    ED Discharge Orders    None       Flint Melter 11/24/18 1006    Isla Pence, MD 11/24/18 1016

## 2018-11-24 NOTE — ED Triage Notes (Signed)
Patient took a total of 25mg  of HCTZ before coming to ED, 12.5mg  increments for her BP as instructed by her PCP.

## 2018-11-24 NOTE — ED Triage Notes (Signed)
Patient with elevated BP, she states that she has been having palpitations and high blood pressures during the day today.  Patient states she was been to PCP this week.  Patient has a Holter monitor on for her tachycardia.

## 2018-11-24 NOTE — ED Notes (Signed)
Pt given dc instructions pt verbalizes understanding.  

## 2018-12-05 ENCOUNTER — Ambulatory Visit (INDEPENDENT_AMBULATORY_CARE_PROVIDER_SITE_OTHER): Payer: Self-pay | Admitting: Neurological Surgery

## 2018-12-13 ENCOUNTER — Ambulatory Visit: Payer: HMO | Attending: Internal Medicine

## 2018-12-13 DIAGNOSIS — Z20822 Contact with and (suspected) exposure to covid-19: Secondary | ICD-10-CM

## 2018-12-13 DIAGNOSIS — Z20828 Contact with and (suspected) exposure to other viral communicable diseases: Secondary | ICD-10-CM | POA: Insufficient documentation

## 2018-12-14 ENCOUNTER — Other Ambulatory Visit: Payer: Self-pay

## 2018-12-16 LAB — COVID-19 SCREENING - SEND-OUT: SARS-COV-2 RNA: NOT DETECTED

## 2019-01-04 ENCOUNTER — Other Ambulatory Visit: Payer: Self-pay

## 2019-01-04 ENCOUNTER — Ambulatory Visit (INDEPENDENT_AMBULATORY_CARE_PROVIDER_SITE_OTHER): Payer: HMO | Admitting: Neurological Surgery

## 2019-01-04 VITALS — HR 72 | Temp 98.7°F | Resp 12 | Ht 61.0 in | Wt 140.0 lb

## 2019-01-04 DIAGNOSIS — M5136 Other intervertebral disc degeneration, lumbar region: Secondary | ICD-10-CM

## 2019-01-06 ENCOUNTER — Encounter (INDEPENDENT_AMBULATORY_CARE_PROVIDER_SITE_OTHER): Payer: Self-pay | Admitting: Neurological Surgery

## 2019-01-06 DIAGNOSIS — M5136 Other intervertebral disc degeneration, lumbar region: Secondary | ICD-10-CM | POA: Insufficient documentation

## 2019-01-06 NOTE — Progress Notes (Signed)
This progress note has been dictated.

## 2019-01-07 NOTE — Letter (Signed)
PATIENT NAMENyomie Ehrlich, [place] NUMBER:  [id number]  DATE OF SERVICE: 01/04/2019  DATE OF BIRTH:        January 07, 2018     Londell Moh, DO   [place], [place] [id number]     Dear Dr. Julian Reil:     I saw Elizabeth Hunter in the [place] office on January 04, 2019.  The patient is 78 years old and presents with chief complaint of low back pain without sciatic pain or neurogenic claudication.  The patient has had physical therapy without relief.  She has had spinal injections without relief.  On examination, motor is 5/5.  Reflex testing is normal.  There are no pathological reflexes.  Lumbar spine MRI scan was personally reviewed.  There is transitional anatomy by MRI criteria.  There is very severe degenerative disk at both L2-L3 and L3-L4 levels with extensive endplate changes.  Overall there is minimal neural compression.     I believe the patient is symptomatic for degenerative disk disease at L2-L3 and L3-L4 levels.  I do not typically recommend lumbar fusion surgery for this diagnosis, however, that could be an option for this patient and I offered to refer her for consideration of that and the patient will consider whether or not she wants to proceed with that referral.     Thank you very much.  If you have any questions, please feel free to call me.     Sincerely,        Maryfrances Bunnell, MD  Associate Professor   [place] [facility] Neuro[facility]              DD:  01/07/2019 16:07:35  DT:  01/07/2019 22:42:26 CK  D#:  [id number]

## 2019-01-29 ENCOUNTER — Other Ambulatory Visit (INDEPENDENT_AMBULATORY_CARE_PROVIDER_SITE_OTHER): Payer: Self-pay | Admitting: Family Medicine

## 2019-01-29 DIAGNOSIS — F410 Panic disorder [episodic paroxysmal anxiety] without agoraphobia: Secondary | ICD-10-CM

## 2019-02-04 ENCOUNTER — Other Ambulatory Visit (INDEPENDENT_AMBULATORY_CARE_PROVIDER_SITE_OTHER): Payer: Self-pay | Admitting: Physician Assistant

## 2019-03-06 ENCOUNTER — Encounter (INDEPENDENT_AMBULATORY_CARE_PROVIDER_SITE_OTHER): Payer: Self-pay | Admitting: Physician Assistant

## 2019-03-06 ENCOUNTER — Ambulatory Visit (INDEPENDENT_AMBULATORY_CARE_PROVIDER_SITE_OTHER): Payer: HMO | Admitting: Physician Assistant

## 2019-03-06 ENCOUNTER — Other Ambulatory Visit: Payer: Self-pay

## 2019-03-06 VITALS — BP 124/68 | HR 71 | Temp 97.6°F | Resp 12 | Ht 61.0 in | Wt 151.4 lb

## 2019-03-06 DIAGNOSIS — M5136 Other intervertebral disc degeneration, lumbar region: Secondary | ICD-10-CM

## 2019-03-06 DIAGNOSIS — M545 Low back pain, unspecified: Secondary | ICD-10-CM

## 2019-03-06 DIAGNOSIS — K219 Gastro-esophageal reflux disease without esophagitis: Secondary | ICD-10-CM

## 2019-03-06 DIAGNOSIS — M533 Sacrococcygeal disorders, not elsewhere classified: Secondary | ICD-10-CM

## 2019-03-06 MED ORDER — KETOROLAC 30 MG/ML (1 ML) INJECTION SOLUTION
30.00 mg | Freq: Once | INTRAMUSCULAR | 0 refills | Status: AC
Start: 2019-03-06 — End: 2019-03-06

## 2019-03-06 MED ORDER — DEXAMETHASONE SODIUM PHOSPHATE 4 MG/ML INJECTION SOLUTION
4.00 mg | INTRAMUSCULAR | 0 refills | Status: AC
Start: 2019-03-06 — End: 2019-03-06

## 2019-03-06 MED ORDER — OMEPRAZOLE 20 MG CAPSULE,DELAYED RELEASE
20.0000 mg | DELAYED_RELEASE_CAPSULE | Freq: Every day | ORAL | 1 refills | Status: DC
Start: 2019-03-06 — End: 2019-06-20

## 2019-03-06 MED ORDER — HYDROCODONE 5 MG-ACETAMINOPHEN 325 MG TABLET
1.00 | ORAL_TABLET | Freq: Four times a day (QID) | ORAL | 0 refills | Status: AC | PRN
Start: 2019-03-06 — End: 2019-03-13

## 2019-03-06 NOTE — Progress Notes (Signed)
FAMILY MED, [facility]   [address] [address]    Progress Note    Name: Elizabeth Hunter MRN:  [id number]   Date: 03/06/2019 Age: 79 y.o.         Reason for Visit: Back Pain (chronic back pain, herniations has seen dr Rudene Re for it, not surgical) and Sciatica    History of Present Illness  Elizabeth Hunter is a 79 y.o. female who is being seen today for low back pain. Chronic back pain. Has followed with neurosurgeon. States he did not suggest surgery at this time. Pt states he did offer oral pain medications to help with her pain. States that day of her appointment with him she was resistant to oral pain medications but states she had a very bad day yesterday with pain and feels maybe the oral pain medications could have helped. States in the past she has had to try Norco for pain. Pt is unsure if if was for back pain or other types of pain. Pt denies loss of bowel or bladder. Denies any recent falls or injury.      Past Medical History:   Diagnosis Date   . Back ache    . Bowel trouble     obstruction/nonsurgical   . Chronic headaches    . Depression    . Fibromyalgia    . Osteoarthritis          Past Surgical History:   Procedure Laterality Date   . HX APPENDECTOMY     . HX CESAREAN SECTION      2   . HX CHOLECYSTECTOMY     . HX LAP CHOLECYSTECTOMY  2012   . HX SINUS SURGERY  2014         Current Outpatient Medications   Medication Sig   . ALPRAZolam (XANAX) 0.5 mg Oral Tablet TAKE ONE TABLET BY MOUTH THREE TIMES A DAY AS NEEDED FOR INSOMNIA   . Ca-D3-mag ox-zinc-cop-mang-bor (CALCIUM 600+D3 PLUS) 600 mg calcium- 800 unit-50 mg Oral Tablet Take by mouth   . citalopram (CELEXA) 40 mg Oral Tablet TAKE 1 TABLET DAILY   . dexamethasone (DECADRON) 4 mg/mL Injection Solution 1 mL (4 mg total) by Intravenous route Now for 1 dose   . docusate sodium (STOOL SOFTENER) 100 mg Oral Capsule Take 100 mg by mouth Once a day   . Herbal Drugs (COLON HERBAL CLEANSER) Oral Capsule Take by mouth   .  HYDROcodone-acetaminophen (NORCO) 5-325 mg Oral Tablet Take 1 Tab by mouth Every 6 hours as needed for Pain for up to 7 days   . ketorolac (TORADOL) 30 mg/mL (1 mL) Injection Solution 1 mL (30 mg total) by IntraMUSCULAR route One time for 1 dose   . Lactobacillus acidophilus (PROBIOTIC ACIDOPHILUS) 1.5 mg (250 million cell) Oral Capsule Take by mouth   . meloxicam (MOBIC) 15 mg Oral Tablet Take 1 Tab (15 mg total) by mouth Once a day   . methocarbamoL (ROBAXIN) 500 mg Oral Tablet Take 1 Tab (500 mg total) by mouth Every night as needed   . omeprazole (PRILOSEC) 20 mg Oral Capsule, Delayed Release(E.C.) Take 1 Cap (20 mg total) by mouth Once a day   . zinc 50 mg Oral Tablet Take 50 mg by mouth Once a day     No Known Allergies  Family Medical History:     Problem Relation (Age of Onset)    Heart Attack Father (49), Sister (14)  Hypertension (High Blood Pressure) Mother (76)            Social History     Tobacco Use   . Smoking status: Never Smoker   . Smokeless tobacco: Never Used   Substance Use Topics   . Alcohol use: No   . Drug use: No       Nursing Notes  There are no exam notes on file for this visit.     Review of Systems  Review of Systems   Constitutional: Negative for chills, fever, malaise/fatigue and weight loss.   HENT: Negative for congestion, ear discharge, ear pain, hearing loss, nosebleeds, sinus pain and sore throat.    Eyes: Negative for blurred vision, photophobia and discharge.   Respiratory: Negative for cough, hemoptysis, sputum production, shortness of breath and wheezing.    Cardiovascular: Negative for chest pain, palpitations and leg swelling.   Gastrointestinal: Negative for abdominal pain, blood in stool, constipation, diarrhea, heartburn, nausea and vomiting.   Genitourinary: Negative for dysuria, frequency, hematuria and urgency.   Musculoskeletal: Positive for back pain, joint pain and myalgias. Negative for falls and neck pain.   Skin: Negative for itching and rash.   Neurological:  Negative for dizziness, tingling, tremors, weakness and headaches.   Endo/Heme/Allergies: Negative for environmental allergies. Does not bruise/bleed easily.   Psychiatric/Behavioral: Negative for depression, memory loss, substance abuse and suicidal ideas. The patient is nervous/anxious. The patient does not have insomnia.        Physical Exam:  BP 124/68 (Site: Left, Patient Position: Sitting, Cuff Size: Adult)   Pulse 71   Temp 36.4 C (97.6 F) (Tympanic)   Resp 12   Ht 1.549 m (5\' 1" )   Wt 68.7 kg (151 lb 6.4 oz)   SpO2 97%   BMI 28.61 kg/m       Physical Exam   Constitutional: She is oriented to person, place, and time and well-developed, well-nourished, and in no distress. Vital signs are normal. No distress.   HENT:   Head: Normocephalic and atraumatic.   Eyes: Pupils are equal, round, and reactive to light. Conjunctivae and EOM are normal.   Cardiovascular: Normal rate, regular rhythm and normal heart sounds.   Pulmonary/Chest: Effort normal and breath sounds normal.   Musculoskeletal:      Lumbar back: She exhibits decreased range of motion.   Neurological: She is alert and oriented to person, place, and time. Gait normal. Coordination normal.   Skin: Skin is warm and dry. She is not diaphoretic.   Psychiatric: Mood, memory, affect and judgment normal.   Nursing note and vitals reviewed.      Assessment and Plan    ENCOUNTER DIAGNOSES     ICD-10-CM   1. Low back pain  M54.5   2. DDD (degenerative disc disease), lumbar  M51.36   3. Sacroiliac pain  M53.3   4. Gastroesophageal reflux disease, unspecified whether esophagitis present  K21.9      Orders Placed This Encounter   . ketorolac (TORADOL) 30 mg/mL (1 mL) Injection Solution   . dexamethasone (DECADRON) 4 mg/mL Injection Solution   . omeprazole (PRILOSEC) 20 mg Oral Capsule, Delayed Release(E.C.)   . HYDROcodone-acetaminophen (NORCO) 5-325 mg Oral Tablet   I spent 15 minutes out of 25 minutes counseling her regarding: low back pain  Will give  decadron and Toradol today for low back pain.   Rest  Ice/heat  Consider PT, pt declines at this time  If symptoms worsen or change RTC  Norco given today for low back pain to take as needed, discussed the risk of the medication with the patient  She is not to take with her low dose xanax. Pt voices understanding   Start Prilosec to treat GERD    Follow up: Return in about 6 weeks (around 04/17/2019).      This patient was seen independently.    Read Drivers, PA-C  03/06/2019, 13:51

## 2019-03-08 NOTE — Nursing Note (Signed)
03/06/19 1500 03/06/19 1531   Medication Administration   Initials sjw sjw   Medication  Toradol Dexamethasone   Medication Dose Given 73ml 56ml   Route of Administration IM IM   Site Right Gluteus Left Gluteus   [facility] # [id number]   LOT # [id number]   Expiration date 10/02/20 08/02/19   Manufacturer fresnious kabi fresnious [employer]   Clinic Supplied Yes Yes   Patient Supplied No No

## 2019-03-09 ENCOUNTER — Encounter (INDEPENDENT_AMBULATORY_CARE_PROVIDER_SITE_OTHER): Payer: Self-pay | Admitting: Physician Assistant

## 2019-03-12 ENCOUNTER — Telehealth (INDEPENDENT_AMBULATORY_CARE_PROVIDER_SITE_OTHER): Payer: Self-pay | Admitting: Family Medicine

## 2019-03-12 NOTE — Telephone Encounter (Signed)
Pt said she was to let you know how she is doing  She said the shots did help her somewhat  And she is a little bit improved for now

## 2019-03-12 NOTE — Telephone Encounter (Signed)
Ok. Thank you for the update

## 2019-05-15 ENCOUNTER — Ambulatory Visit (INDEPENDENT_AMBULATORY_CARE_PROVIDER_SITE_OTHER): Payer: HMO | Admitting: Physician Assistant

## 2019-05-15 ENCOUNTER — Other Ambulatory Visit: Payer: Self-pay

## 2019-05-15 ENCOUNTER — Encounter (INDEPENDENT_AMBULATORY_CARE_PROVIDER_SITE_OTHER): Payer: Self-pay | Admitting: Physician Assistant

## 2019-05-15 VITALS — BP 125/65 | HR 73 | Temp 98.6°F | Resp 12 | Ht 61.0 in | Wt 135.0 lb

## 2019-05-15 DIAGNOSIS — M5126 Other intervertebral disc displacement, lumbar region: Secondary | ICD-10-CM

## 2019-05-15 DIAGNOSIS — M5416 Radiculopathy, lumbar region: Secondary | ICD-10-CM

## 2019-05-15 DIAGNOSIS — M5136 Other intervertebral disc degeneration, lumbar region: Secondary | ICD-10-CM

## 2019-05-15 DIAGNOSIS — L600 Ingrowing nail: Secondary | ICD-10-CM

## 2019-05-15 MED ORDER — HYDROCODONE 5 MG-ACETAMINOPHEN 325 MG TABLET
1.00 | ORAL_TABLET | Freq: Four times a day (QID) | ORAL | 0 refills | Status: AC | PRN
Start: 2019-05-15 — End: 2019-05-22

## 2019-05-15 NOTE — Progress Notes (Signed)
FAMILY MED, [facility]   [address] [address]       Name: Elizabeth Hunter  MRN:  [id number]       Date: 05/15/2019  DOB:      Age: 79 y.o.             Reason for Visit: Ingrown Toenail (right great toe)    History of Present Illness  Elizabeth Hunter is a 79 y.o. female who is being seen today for bilateral toenail ingrown on both great toes.  Patient states this has been bothering her for weeks.  Patient also does complain of low back pain which is chronic.  Has seen Dr. Rudene Re for this states he does not recommend surgery at this time.  Patient does request a copy of her MRI on a CD.  I did let her leave the office without this I have asked Apolonio Schneiders to get this disc ready for the patient and call her when it is ready.    Past Medical History:   Diagnosis Date    Back ache     Bowel trouble     obstruction/nonsurgical    Chronic headaches     Depression     Fibromyalgia     Osteoarthritis          Past Surgical History:   Procedure Laterality Date    HX APPENDECTOMY      HX CESAREAN SECTION      2    HX CHOLECYSTECTOMY      HX LAP CHOLECYSTECTOMY  2012    HX SINUS SURGERY  2014         Current Outpatient Medications   Medication Sig    ALPRAZolam (XANAX) 0.5 mg Oral Tablet TAKE ONE TABLET BY MOUTH THREE TIMES A DAY AS NEEDED FOR INSOMNIA    Ca-D3-mag ox-zinc-cop-mang-bor (CALCIUM 600+D3 PLUS) 600 mg calcium- 800 unit-50 mg Oral Tablet Take by mouth    citalopram (CELEXA) 40 mg Oral Tablet TAKE 1 TABLET DAILY    docusate sodium (STOOL SOFTENER) 100 mg Oral Capsule Take 100 mg by mouth Once a day    Herbal Drugs (COLON HERBAL CLEANSER) Oral Capsule Take by mouth    HYDROcodone-acetaminophen (NORCO) 5-325 mg Oral Tablet Take 1 Tablet by mouth Every 6 hours as needed for Pain for up to 7 days    Lactobacillus acidophilus (PROBIOTIC ACIDOPHILUS) 1.5 mg (250 million cell) Oral Capsule Take by mouth    meloxicam (MOBIC) 15 mg Oral Tablet Take 1 Tab (15 mg total)  by mouth Once a day    methocarbamoL (ROBAXIN) 500 mg Oral Tablet Take 1 Tab (500 mg total) by mouth Every night as needed    omeprazole (PRILOSEC) 20 mg Oral Capsule, Delayed Release(E.C.) Take 1 Cap (20 mg total) by mouth Once a day    zinc 50 mg Oral Tablet Take 50 mg by mouth Once a day     No Known Allergies  Family Medical History:     Problem Relation (Age of Onset)    Heart Attack Father (18), Sister (59)    Hypertension (High Blood Pressure) Mother (54)            Social History     Tobacco Use    Smoking status: Never Smoker    Smokeless tobacco: Never Used   Vaping Use    Vaping Use: Never used   Substance Use Topics    Alcohol use: No  Drug use: No       Nursing Notes  There are no exam notes on file for this visit.     Review of Systems  Review of Systems   Constitutional: Negative for chills, fever, malaise/fatigue and weight loss.   HENT: Negative for congestion, ear discharge, ear pain, hearing loss, nosebleeds, sinus pain and sore throat.    Eyes: Negative for blurred vision, photophobia and discharge.   Respiratory: Negative for cough, hemoptysis, sputum production, shortness of breath and wheezing.    Cardiovascular: Negative for chest pain, palpitations and leg swelling.   Gastrointestinal: Negative for abdominal pain, blood in stool, constipation, diarrhea, heartburn, nausea and vomiting.   Genitourinary: Negative for dysuria, frequency, hematuria and urgency.   Musculoskeletal: Positive for back pain, joint pain and myalgias. Negative for falls and neck pain.   Skin: Negative for itching and rash.        Great toenails ingrown   Neurological: Negative for dizziness, tingling, tremors, weakness and headaches.   Endo/Heme/Allergies: Negative for environmental allergies. Does not bruise/bleed easily.   Psychiatric/Behavioral: Negative for depression, memory loss, substance abuse and suicidal ideas. The patient is not nervous/anxious and does not have insomnia.        Physical Exam:  BP  125/65    Pulse 73    Temp 37 C (98.6 F)    Resp 12    Ht 1.549 m (5\' 1" )    Wt 61.2 kg (135 lb)    SpO2 98%    BMI 25.51 kg/m       Physical Exam  Vitals and nursing note reviewed.   Constitutional:       General: She is not in acute distress.     Appearance: She is not diaphoretic.   HENT:      Head: Normocephalic and atraumatic.   Eyes:      Conjunctiva/sclera: Conjunctivae normal.      Pupils: Pupils are equal, round, and reactive to light.   Cardiovascular:      Rate and Rhythm: Normal rate and regular rhythm.      Heart sounds: Normal heart sounds.   Pulmonary:      Effort: Pulmonary effort is normal.      Breath sounds: Normal breath sounds.   Abdominal:      General: Bowel sounds are normal.      Palpations: Abdomen is soft.   Musculoskeletal:      Lumbar back: Tenderness present. Decreased range of motion.      Comments: Patient wears lumbar back brace for support   Skin:     General: Skin is warm and dry.   Neurological:      Mental Status: She is alert and oriented to person, place, and time.      Coordination: Coordination normal.      Gait: Gait is intact.   Psychiatric:         Mood and Affect: Mood and affect normal.         Cognition and Memory: Memory normal.         Judgment: Judgment normal.         Assessment and Plan    ENCOUNTER DIAGNOSES     ICD-10-CM   1. Ingrown toenail of both feet  L60.0   2. DDD (degenerative disc disease), lumbar  M51.36   3. Lumbar disc herniation  M51.26   4. Lumbar radiculopathy  M54.16      I spent 20 minutes out of 30 minutes  counseling her regarding:  Ingrown toenail we will make a referral for Podiatry.  Refilled Norco.  Had a long discussion with the patient not to fear taking the  Norco as directed.  She only takes 1 tablet twice a week.  Patient deals with chronic pain.  States her only good days or the days that she takes the 1 tablet of Norco.  Discussed with her risk and benefits of the medication.  She may take the Norco every 6 hours as needed for pain  if needed.   [facility] was last reviewed on 05/15/2019  3:09 PM by Read Drivers K and result was REVIEWED PDMP.     Based on this, I have no concerns for prescribing controlled substances at this time.  Refilled medications  Orders Placed This Encounter    OUTSIDE CONSULT/REFERRAL PROVIDER(AMB)    HYDROcodone-acetaminophen (NORCO) 5-325 mg Oral Tablet         Follow up: Return in about 2 months (around 07/15/2019), or if symptoms worsen or fail to improve.      This patient was seen independently.    Read Drivers, PA-C  05/15/2019, 15:08

## 2019-05-24 ENCOUNTER — Ambulatory Visit: Payer: HMO | Attending: Physician Assistant | Admitting: Physician Assistant

## 2019-05-24 ENCOUNTER — Encounter (INDEPENDENT_AMBULATORY_CARE_PROVIDER_SITE_OTHER): Payer: Self-pay | Admitting: Physician Assistant

## 2019-05-24 ENCOUNTER — Ambulatory Visit (INDEPENDENT_AMBULATORY_CARE_PROVIDER_SITE_OTHER): Payer: HMO

## 2019-05-24 ENCOUNTER — Other Ambulatory Visit: Payer: Self-pay

## 2019-05-24 ENCOUNTER — Ambulatory Visit (INDEPENDENT_AMBULATORY_CARE_PROVIDER_SITE_OTHER): Payer: HMO | Admitting: Physician Assistant

## 2019-05-24 VITALS — BP 102/52 | HR 81 | Temp 96.9°F | Wt 132.0 lb

## 2019-05-24 DIAGNOSIS — G4700 Insomnia, unspecified: Secondary | ICD-10-CM | POA: Insufficient documentation

## 2019-05-24 DIAGNOSIS — E782 Mixed hyperlipidemia: Secondary | ICD-10-CM

## 2019-05-24 DIAGNOSIS — R81 Glycosuria: Secondary | ICD-10-CM

## 2019-05-24 DIAGNOSIS — R319 Hematuria, unspecified: Secondary | ICD-10-CM

## 2019-05-24 DIAGNOSIS — R5383 Other fatigue: Secondary | ICD-10-CM | POA: Insufficient documentation

## 2019-05-24 DIAGNOSIS — F320 Major depressive disorder, single episode, mild: Secondary | ICD-10-CM

## 2019-05-24 DIAGNOSIS — F411 Generalized anxiety disorder: Secondary | ICD-10-CM

## 2019-05-24 DIAGNOSIS — F410 Panic disorder [episodic paroxysmal anxiety] without agoraphobia: Secondary | ICD-10-CM

## 2019-05-24 DIAGNOSIS — Z79899 Other long term (current) drug therapy: Secondary | ICD-10-CM | POA: Insufficient documentation

## 2019-05-24 LAB — COMPREHENSIVE METABOLIC PNL, FASTING
ALBUMIN: 4.3 g/dL (ref 3.4–4.8)
ALKALINE PHOSPHATASE: 51 U/L — ABNORMAL LOW (ref 55–145)
ALT (SGPT): 18 U/L (ref 8–22)
ANION GAP: 7 mmol/L (ref 4–13)
AST (SGOT): 20 U/L (ref 8–45)
BILIRUBIN TOTAL: 0.5 mg/dL (ref 0.3–1.3)
BUN/CREA RATIO: 23 — ABNORMAL HIGH (ref 6–22)
BUN: 21 mg/dL (ref 8–25)
CALCIUM: 10 mg/dL (ref 8.8–10.2)
CHLORIDE: 106 mmol/L (ref 96–111)
CO2 TOTAL: 30 mmol/L (ref 23–31)
CREATININE: 0.92 mg/dL (ref 0.60–1.05)
ESTIMATED GFR: 60 mL/min/BSA (ref >=60)
GLUCOSE: 80 mg/dL (ref 65–125)
POTASSIUM: 4.1 mmol/L (ref 3.5–5.1)
PROTEIN TOTAL: 7.1 g/dL (ref 6.0–8.0)
SODIUM: 143 mmol/L (ref 136–145)

## 2019-05-24 LAB — URINALYSIS, MACROSCOPIC
BILIRUBIN: NEGATIVE mg/dL
BLOOD: NEGATIVE mg/dL
GLUCOSE: 250 mg/dL — AB
KETONES: NEGATIVE mg/dL
LEUKOCYTES: NEGATIVE WBCs/uL
NITRITE: NEGATIVE
PH: 6.5 (ref 4.5–8.0)
PROTEIN: NEGATIVE mg/dL
SPECIFIC GRAVITY: 1.02 (ref 1.001–1.035)
UROBILINOGEN: 0.2 mg/dL (ref 0.2–1.0)

## 2019-05-24 LAB — URINALYSIS, MICROSCOPIC

## 2019-05-24 LAB — CBC WITH DIFF
BASOPHIL #: 0.1 10*3/uL (ref 0.00–0.30)
BASOPHIL %: 1 % (ref 0–1)
EOSINOPHIL #: 0.1 10*3/uL (ref 0.00–0.50)
EOSINOPHIL %: 2 % (ref 0–4)
HCT: 38.5 % (ref 37.0–47.0)
HGB: 13.1 g/dL (ref 12.5–16.0)
LYMPHOCYTE #: 1.5 10*3/uL (ref 0.90–4.80)
LYMPHOCYTE %: 26 % (ref 23–35)
MCH: 32.1 pg (ref 28.0–33.0)
MCHC: 34.1 g/dL (ref 32.0–37.0)
MCV: 94.1 fL (ref 78.0–100.0)
MONOCYTE #: 0.4 10*3/uL (ref 0.30–0.90)
MONOCYTE %: 8 % (ref 0–12)
NEUTROPHIL #: 3.5 10*3/uL (ref 1.70–7.00)
NEUTROPHIL %: 63 % (ref 50–70)
PLATELETS: 205 10*3/uL (ref 130–400)
RBC: 4.09 10*6/uL — ABNORMAL LOW (ref 4.20–5.40)
RDW: 13.2 % (ref 9.9–16.5)
WBC: 5.5 10*3/uL (ref 4.5–11.0)

## 2019-05-24 LAB — LIPID PANEL
CHOL/HDL RATIO: 4.6
CHOLESTEROL: 207 mg/dL — ABNORMAL HIGH (ref 100–200)
HDL CHOL: 45 mg/dL — ABNORMAL LOW (ref >=50)
LDL CALC: 147 mg/dL — ABNORMAL HIGH (ref <100)
NON-HDL: 162 mg/dL (ref <=190)
TRIGLYCERIDES: 73 mg/dL (ref <150)
VLDL CALC: 15 mg/dL (ref <30)

## 2019-05-24 LAB — VITAMIN D 25 TOTAL: VITAMIN D 25, TOTAL: >154.2 ng/mL — ABNORMAL HIGH (ref 30.0–100.0)

## 2019-05-24 LAB — THYROID STIMULATING HORMONE WITH FREE T4 REFLEX: TSH: 2.701 u[IU]/mL (ref 0.430–3.550)

## 2019-05-24 LAB — VITAMIN B12: VITAMIN B 12: 337 pg/mL (ref 200–900)

## 2019-05-24 MED ORDER — ALPRAZOLAM 0.5 MG TABLET
0.50 mg | ORAL_TABLET | Freq: Every evening | ORAL | 3 refills | Status: DC | PRN
Start: 2019-05-24 — End: 2019-08-13

## 2019-05-24 NOTE — Progress Notes (Signed)
FAMILY MED, [facility]   [address]  [place] [address]       Name: Elizabeth Hunter  MRN:  [id number]       Date: 05/24/2019  DOB:      Age: 79 y.o.             Reason for Visit: Medication Check (refill of meds and wants labwork) and Multiple medical problems follow up (pt went to [facility] for UTI yesterday and wants to discuss findings, she brought her paperwork)    History of Present Illness  Elizabeth Hunter is a 79 y.o. female who is being seen today for urinary frequency and scant amount of urine. Pt states this happened yesterday. Pt states she went to [facility] in [place], [place]. Pt states she was dx with UTI. States she was told her urine had glucose in it yesterday. Pt is not a known diabetic.  He also requests refill of Xanax.  Takes for insomnia    Past Medical History:   Diagnosis Date    Back ache     Bowel trouble     obstruction/nonsurgical    Chronic headaches     Depression     Fibromyalgia     Osteoarthritis          Past Surgical History:   Procedure Laterality Date    HX APPENDECTOMY      HX CESAREAN SECTION      2    HX CHOLECYSTECTOMY      HX LAP CHOLECYSTECTOMY  2012    HX SINUS SURGERY  2014         Current Outpatient Medications   Medication Sig    ALPRAZolam (XANAX) 0.5 mg Oral Tablet Take 1 Tablet (0.5 mg total) by mouth Every night as needed for Insomnia or Anxiety    Ca-D3-mag ox-zinc-cop-mang-bor (CALCIUM 600+D3 PLUS) 600 mg calcium- 800 unit-50 mg Oral Tablet Take by mouth    citalopram (CELEXA) 40 mg Oral Tablet TAKE 1 TABLET DAILY    docusate sodium (STOOL SOFTENER) 100 mg Oral Capsule Take 100 mg by mouth Once a day    Herbal Drugs (COLON HERBAL CLEANSER) Oral Capsule Take by mouth    Lactobacillus acidophilus (PROBIOTIC ACIDOPHILUS) 1.5 mg (250 million cell) Oral Capsule Take by mouth    meloxicam (MOBIC) 15 mg Oral Tablet Take 1 Tab (15 mg total) by mouth Once a day    methocarbamoL (ROBAXIN) 500 mg Oral Tablet Take 1 Tab (500  mg total) by mouth Every night as needed    nitrofurantoin (MACROBID) 100 mg Oral Capsule Take 100 mg by mouth Twice daily    omeprazole (PRILOSEC) 20 mg Oral Capsule, Delayed Release(E.C.) Take 1 Cap (20 mg total) by mouth Once a day    zinc 50 mg Oral Tablet Take 50 mg by mouth Once a day     No Known Allergies  Family Medical History:     Problem Relation (Age of Onset)    Heart Attack Father (38), Sister (79)    Hypertension (High Blood Pressure) Mother (79)            Social History     Tobacco Use    Smoking status: Never Smoker    Smokeless tobacco: Never Used   Vaping Use    Vaping Use: Never used   Substance Use Topics    Alcohol use: No    Drug use: No       Nursing  Notes  There are no exam notes on file for this visit.     Review of Systems  Review of Systems   Constitutional: Negative for chills, fever, malaise/fatigue and weight loss.   HENT: Negative for congestion, ear discharge, ear pain, hearing loss, nosebleeds, sinus pain and sore throat.    Eyes: Negative for blurred vision, photophobia and discharge.   Respiratory: Negative for cough, hemoptysis, sputum production, shortness of breath and wheezing.    Cardiovascular: Negative for chest pain, palpitations and leg swelling.   Gastrointestinal: Negative for abdominal pain, blood in stool, constipation, diarrhea, heartburn, nausea and vomiting.   Genitourinary: Positive for frequency and hematuria. Negative for dysuria and urgency.        Scant amount of urine yesterday, normal today    Musculoskeletal: Positive for back pain and joint pain. Negative for falls, myalgias and neck pain.   Skin: Negative for itching and rash.        Has up coming appointment with podiatry on 05/28/19   Neurological: Negative for dizziness, tingling, tremors, weakness and headaches.   Endo/Heme/Allergies: Negative for environmental allergies. Does not bruise/bleed easily.   Psychiatric/Behavioral: Positive for depression. Negative for memory loss, substance abuse  and suicidal ideas. The patient is nervous/anxious and has insomnia.         Doing well with medication   Pt does request refill of xanax while here today   Takes for insomnia        Physical Exam:  BP (!) 102/52 (Site: Left, Patient Position: Sitting, Cuff Size: Adult Large)    Pulse 81    Temp 36.1 C (96.9 F) (Tympanic)    Wt 59.9 kg (132 lb) Comment: pt stated her weight, did not want to go on scale   BMI 24.94 kg/m       Physical Exam  Vitals and nursing note reviewed.   Constitutional:       General: She is not in acute distress.     Appearance: She is not diaphoretic.   HENT:      Head: Normocephalic and atraumatic.   Eyes:      Conjunctiva/sclera: Conjunctivae normal.      Pupils: Pupils are equal, round, and reactive to light.   Cardiovascular:      Rate and Rhythm: Normal rate and regular rhythm.      Heart sounds: Normal heart sounds.   Pulmonary:      Effort: Pulmonary effort is normal.      Breath sounds: Normal breath sounds.   Abdominal:      General: Bowel sounds are normal.      Palpations: Abdomen is soft.      Tenderness: There is no abdominal tenderness.   Musculoskeletal:      Lumbar back: Decreased range of motion.   Skin:     General: Skin is warm and dry.   Neurological:      Mental Status: She is alert and oriented to person, place, and time.      Coordination: Coordination normal.      Gait: Gait is intact.   Psychiatric:         Mood and Affect: Mood and affect normal.         Cognition and Memory: Memory normal.         Judgment: Judgment normal.         Assessment and Plan    ENCOUNTER DIAGNOSES     ICD-10-CM   1. Insomnia, unspecified type  G47.00  2. Hematuria  R31.9   3. Glucosuria  R81   4. Fatigue, unspecified type  R53.83   5. 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 Generalized anxiety disorder with panic attacks  F41.1    F41.0      I spent 20 minutes out of 30 minutes counseling her regarding:   Medication refill of Xanax.  Follow-up from [place] due to possible UTI and glucose in her urine  Patient will have lab work repeated today   Orders Placed This Encounter    Urinalysis, Macroscopic and Microscopic    Comp Metabolic Panel-Fasting    CBC/DIFF    Hemoglobin A1C    Lipid Panel    TSH w/ Free T4 Reflex    Vitamin B12    VITAMIN D 25 TOTAL    ALPRAZolam (XANAX) 0.5 mg Oral Tablet   Discussed with the patient about her trip to [place] upcoming in about 1 week.  Patient states she does suffer with the chronic back pain her pain juicing 8/10 daily.  She has Norco to take for pain.  We discussed possible Toradol injection prior to her trip.  Patient will schedule a nurse visit for Toradol injection only on June 01, 2019  Continue with antibiotic  Drink plenty of fluids, water.  [facility] was last reviewed on 05/24/2019  9:21 AM by Camillia Herter K and result was REVIEWED PDMP.     Based on this, I have no concerns for prescribing controlled substances at this time.      Follow up: Return in about 1 week (around 05/31/2019), or toradol injection.      This patient was seen independently.    Camillia Herter, PA-C  05/24/2019, 09:20

## 2019-05-25 LAB — HGA1C (HEMOGLOBIN A1C WITH EST AVG GLUCOSE)
ESTIMATED AVERAGE GLUCOSE: 100 mg/dL (ref 0–153)
HEMOGLOBIN A1C: 5.1 % (ref <=7.0)

## 2019-05-28 ENCOUNTER — Telehealth (INDEPENDENT_AMBULATORY_CARE_PROVIDER_SITE_OTHER): Payer: Self-pay | Admitting: Physician Assistant

## 2019-05-28 ENCOUNTER — Other Ambulatory Visit (INDEPENDENT_AMBULATORY_CARE_PROVIDER_SITE_OTHER): Payer: Self-pay | Admitting: Physician Assistant

## 2019-05-28 DIAGNOSIS — R7989 Other specified abnormal findings of blood chemistry: Secondary | ICD-10-CM

## 2019-06-01 ENCOUNTER — Other Ambulatory Visit: Payer: Self-pay

## 2019-06-01 ENCOUNTER — Ambulatory Visit (INDEPENDENT_AMBULATORY_CARE_PROVIDER_SITE_OTHER): Payer: HMO

## 2019-06-01 DIAGNOSIS — M5136 Other intervertebral disc degeneration, lumbar region: Secondary | ICD-10-CM

## 2019-06-01 DIAGNOSIS — M25559 Pain in unspecified hip: Secondary | ICD-10-CM

## 2019-06-01 DIAGNOSIS — M533 Sacrococcygeal disorders, not elsewhere classified: Secondary | ICD-10-CM

## 2019-06-01 NOTE — Nursing Note (Signed)
06/01/19 1500   Medication Administration   Initials SJW   Medication  Toradol   Medication Dose Given   Route of Administration IM   Site Right Gluteus   [facility] # [id number]   LOT # [id number]   Expiration date 01/01/21   Manufacturer [employer]   Clinic Supplied Yes   Patient Supplied No

## 2019-06-12 ENCOUNTER — Other Ambulatory Visit: Payer: Self-pay

## 2019-06-12 ENCOUNTER — Telehealth (INDEPENDENT_AMBULATORY_CARE_PROVIDER_SITE_OTHER): Payer: Self-pay | Admitting: Physician Assistant

## 2019-06-12 ENCOUNTER — Telehealth (INDEPENDENT_AMBULATORY_CARE_PROVIDER_SITE_OTHER): Payer: HMO | Admitting: Physician Assistant

## 2019-06-12 VITALS — BP 142/78 | Ht 61.0 in | Wt 128.0 lb

## 2019-06-12 DIAGNOSIS — F410 Panic disorder [episodic paroxysmal anxiety] without agoraphobia: Secondary | ICD-10-CM

## 2019-06-12 DIAGNOSIS — F320 Major depressive disorder, single episode, mild: Secondary | ICD-10-CM

## 2019-06-12 DIAGNOSIS — F411 Generalized anxiety disorder: Secondary | ICD-10-CM

## 2019-06-12 NOTE — Progress Notes (Signed)
FAMILY MED, [facility]   [address] [place]  [place] [address]    Telephone Visit    Name:  Elizabeth Hunter MRN: [id number]   Date:  06/12/2019 Age:   79 y.o.     The patient/family initiated a request for telephone service.  Verbal consent for this service was obtained from the patient/family.    Last office visit in this department: 05/24/2019      Reason for call: anxiety  Call notes: pt states she went to [facility] to visit her daughter and family states the first evening she was there had an episode where she became very anxious and had shaking of her legs and felt weak. Pt states she did improve with time and rest. Pt states she did just return home from her trip yesterday. States she has concerns of what caused this episode. Pt states she did make some changes wit her diet and and medications prior to her trip due to anxious about having a bowel movement while on the plane. Pt states she then had constipation while away. States her niece is a Engineer, civil (consulting) and talked her into taking a laxative which did help her had a nice bowel movement. States she awoke this morning also feeling still anxious. States she never takes a xanax during the day but did this morning and states it did help with her symptoms and has felt better today. Pt normally on takes 1/2 tablet of xanax 0.5mg  at bedtime to help with anxiety and sleep onset.   We reviewed the events that happened while she was away and both agree we feel she suffered from a bad panic attack.   Pt will continue with current medications  Pt will take the xanax 0.5mg  in the mornings if needed, continue with 1/2 tablet of xanax 0.5mg  at bed time also.          ICD-10-CM    1. Generalized anxiety disorder with panic attacks  F41.1     F41.0    2. 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    Read Drivers, PA-C

## 2019-06-13 NOTE — Telephone Encounter (Signed)
PT NOTIFIED  

## 2019-06-19 ENCOUNTER — Encounter (INDEPENDENT_AMBULATORY_CARE_PROVIDER_SITE_OTHER): Payer: Medicare Other | Admitting: Ophthalmology

## 2019-06-20 ENCOUNTER — Telehealth (INDEPENDENT_AMBULATORY_CARE_PROVIDER_SITE_OTHER): Payer: HMO | Admitting: Physician Assistant

## 2019-06-20 ENCOUNTER — Encounter (INDEPENDENT_AMBULATORY_CARE_PROVIDER_SITE_OTHER): Payer: Self-pay | Admitting: Physician Assistant

## 2019-06-20 ENCOUNTER — Other Ambulatory Visit: Payer: Self-pay

## 2019-06-20 ENCOUNTER — Telehealth (INDEPENDENT_AMBULATORY_CARE_PROVIDER_SITE_OTHER): Payer: Self-pay | Admitting: Physician Assistant

## 2019-06-20 VITALS — BP 169/85 | HR 105 | Ht 61.0 in | Wt 128.0 lb

## 2019-06-20 DIAGNOSIS — F411 Generalized anxiety disorder: Secondary | ICD-10-CM

## 2019-06-20 DIAGNOSIS — I714 Abdominal aortic aneurysm, without rupture, unspecified: Secondary | ICD-10-CM

## 2019-06-20 DIAGNOSIS — R7989 Other specified abnormal findings of blood chemistry: Secondary | ICD-10-CM

## 2019-06-20 DIAGNOSIS — K5900 Constipation, unspecified: Secondary | ICD-10-CM

## 2019-06-20 DIAGNOSIS — R232 Flushing: Secondary | ICD-10-CM

## 2019-06-20 DIAGNOSIS — F410 Panic disorder [episodic paroxysmal anxiety] without agoraphobia: Secondary | ICD-10-CM

## 2019-06-20 DIAGNOSIS — R002 Palpitations: Secondary | ICD-10-CM

## 2019-06-20 NOTE — Progress Notes (Signed)
FAMILY MED, [facility]   [address]  [place] [address]    Telephone Visit    Name:  Elizabeth Hunter MRN: [id number]   Date:  06/20/2019 Age:   79 y.o.     The patient/family initiated a request for telephone service.  Verbal consent for this service was obtained from the patient/family.    Last office visit in this department: 05/24/2019      Reason for call: 1 week f/u for anxiety. Pt states she still feels very anxious. Pt states she feels like at times she has a physical shake in her body. States when she takes the xanax .5mg  during the day makes her feel sleepy. States she does continue to take at bedtime to help with sleep onset. Pt states she is still having some issues with constipation. Taking a laxative to help her have a bowel movement. Reports she is back to eating her normal diet. Does report her weight at home is 128lbs. States she has restarted her fiber, water and activia yogurt. Pt states her husband took her blood pressure and heart rate today and it was 134/75 and her heart rate was 92. States she feels at times her heart is racing. Denies chest pain. States she has had symptoms of hot flashes at night and some during the day.  Call notes:  We discussed her symptoms. Pt will stop in for outpatient testing with lab work, EKG and CXR  If her symptoms worsen she will go to ER  Discussed with her to try taking 1/2 tablet of the xanax today.  Pt states she is planning on going to lunch with her girlfriends today to play cards and is looking forward to it.   Continue with Celexa and xanax as directed  We will also order ultrasound for screening of AAA   Pt to f/u in the office after testing completed, sooner if needed  Again if symptoms worsen or change she is to go to ER  Continue with stool softener, drink plenty of water  Will continue to monitor weight loss, due to continue weight loss and no longer on a "diet"        ICD-10-CM    1. Generalized anxiety disorder with panic  attacks  F41.1 CBC/DIFF    F41.0 VITAMIN D 25 TOTAL     Comp Metabolic Panel-Non Fasting     ECG 12 LEAD (In Clinic - Patient will Return)     CXR PA & Lat   2. Palpitations  R00.2 CBC/DIFF     VITAMIN D 25 TOTAL     Comp Metabolic Panel-Non Fasting     ECG 12 LEAD (In Clinic - Patient will Return)     CXR PA & Lat   3. High serum vitamin D  R79.89 VITAMIN D 25 TOTAL   4. Hot flashes  R23.2 CBC/DIFF     VITAMIN D 25 TOTAL     Comp Metabolic Panel-Non Fasting     ECG 12 LEAD (In Clinic - Patient will Return)     CXR PA & Lat   5. AAA (abdominal aortic aneurysm) (CMS HCC)  I71.4 Abdominal Duplex for AAA (Medicare)   6. Constipation, unspecified constipation type  K59.00        Total provider time spent with the patient on the phone: 20 minutes.    , PA-C

## 2019-06-25 ENCOUNTER — Ambulatory Visit (INDEPENDENT_AMBULATORY_CARE_PROVIDER_SITE_OTHER): Payer: HMO

## 2019-06-25 ENCOUNTER — Other Ambulatory Visit: Payer: Self-pay

## 2019-06-25 ENCOUNTER — Ambulatory Visit: Payer: HMO | Attending: Physician Assistant | Admitting: Physician Assistant

## 2019-06-25 DIAGNOSIS — F411 Generalized anxiety disorder: Secondary | ICD-10-CM | POA: Insufficient documentation

## 2019-06-25 DIAGNOSIS — R7989 Other specified abnormal findings of blood chemistry: Secondary | ICD-10-CM

## 2019-06-25 DIAGNOSIS — R002 Palpitations: Secondary | ICD-10-CM | POA: Insufficient documentation

## 2019-06-25 DIAGNOSIS — F410 Panic disorder [episodic paroxysmal anxiety] without agoraphobia: Secondary | ICD-10-CM

## 2019-06-25 DIAGNOSIS — R232 Flushing: Secondary | ICD-10-CM

## 2019-06-25 LAB — COMPREHENSIVE METABOLIC PANEL, NON-FASTING
ALBUMIN: 3.9 g/dL (ref 3.4–4.8)
ALKALINE PHOSPHATASE: 49 U/L — ABNORMAL LOW (ref 55–145)
ALT (SGPT): 11 U/L (ref 8–22)
ANION GAP: 6 mmol/L (ref 4–13)
AST (SGOT): 18 U/L (ref 8–45)
BILIRUBIN TOTAL: 0.6 mg/dL (ref 0.3–1.3)
BUN/CREA RATIO: 24 — ABNORMAL HIGH (ref 6–22)
BUN: 20 mg/dL (ref 8–25)
CALCIUM: 9.5 mg/dL (ref 8.8–10.2)
CHLORIDE: 106 mmol/L (ref 96–111)
CO2 TOTAL: 29 mmol/L (ref 23–31)
CREATININE: 0.85 mg/dL (ref 0.60–1.05)
ESTIMATED GFR: 66 mL/min/BSA (ref >=60)
GLUCOSE: 84 mg/dL (ref 65–125)
POTASSIUM: 4 mmol/L (ref 3.5–5.1)
PROTEIN TOTAL: 6.8 g/dL (ref 6.0–8.0)
SODIUM: 141 mmol/L (ref 136–145)

## 2019-06-25 LAB — CBC WITH DIFF
BASOPHIL #: 0.1 10*3/uL (ref 0.00–0.30)
BASOPHIL %: 1 % (ref 0–1)
EOSINOPHIL #: 0.1 10*3/uL (ref 0.00–0.50)
EOSINOPHIL %: 1 % (ref 0–4)
HCT: 36.3 % — ABNORMAL LOW (ref 37.0–47.0)
HGB: 12.2 g/dL — ABNORMAL LOW (ref 12.5–16.0)
LYMPHOCYTE #: 1.4 10*3/uL (ref 0.90–4.80)
LYMPHOCYTE %: 19 % — ABNORMAL LOW (ref 23–35)
MCH: 31.8 pg (ref 28.0–33.0)
MCHC: 33.6 g/dL (ref 32.0–37.0)
MCV: 94.6 fL (ref 78.0–100.0)
MONOCYTE #: 0.5 10*3/uL (ref 0.30–0.90)
MONOCYTE %: 7 % (ref 0–12)
NEUTROPHIL #: 5.5 10*3/uL (ref 1.70–7.00)
NEUTROPHIL %: 73 % — ABNORMAL HIGH (ref 50–70)
PLATELETS: 217 10*3/uL (ref 130–400)
RBC: 3.83 10*6/uL — ABNORMAL LOW (ref 4.20–5.40)
RDW: 12.9 % (ref 9.9–16.5)
WBC: 7.6 10*3/uL (ref 4.5–11.0)

## 2019-06-25 LAB — VITAMIN D 25 TOTAL: VITAMIN D 25, TOTAL: >154.2 ng/mL — ABNORMAL HIGH (ref 30.0–100.0)

## 2019-06-26 ENCOUNTER — Other Ambulatory Visit (INDEPENDENT_AMBULATORY_CARE_PROVIDER_SITE_OTHER): Payer: Self-pay | Admitting: Physician Assistant

## 2019-06-26 DIAGNOSIS — R7989 Other specified abnormal findings of blood chemistry: Secondary | ICD-10-CM

## 2019-06-28 ENCOUNTER — Other Ambulatory Visit (INDEPENDENT_AMBULATORY_CARE_PROVIDER_SITE_OTHER): Payer: Self-pay | Admitting: Family Medicine

## 2019-06-28 MED ORDER — HYDROCODONE 5 MG-ACETAMINOPHEN 325 MG TABLET
1.00 | ORAL_TABLET | Freq: Four times a day (QID) | ORAL | 0 refills | Status: DC | PRN
Start: 2019-06-28 — End: 2020-01-09

## 2019-07-09 ENCOUNTER — Other Ambulatory Visit (HOSPITAL_COMMUNITY): Payer: Self-pay | Admitting: Physician Assistant

## 2019-07-09 ENCOUNTER — Ambulatory Visit
Admission: RE | Admit: 2019-07-09 | Discharge: 2019-07-09 | Disposition: A | Discharge status: Home / Self Care | Payer: HMO | Source: Ambulatory Visit | Attending: Physician Assistant | Admitting: Physician Assistant

## 2019-07-09 ENCOUNTER — Other Ambulatory Visit: Payer: Self-pay

## 2019-07-09 ENCOUNTER — Ambulatory Visit (HOSPITAL_BASED_OUTPATIENT_CLINIC_OR_DEPARTMENT_OTHER)
Admission: RE | Admit: 2019-07-09 | Discharge: 2019-07-09 | Disposition: A | Discharge status: Home / Self Care | Payer: HMO | Source: Ambulatory Visit

## 2019-07-09 DIAGNOSIS — R230 Cyanosis: Secondary | ICD-10-CM

## 2019-07-09 DIAGNOSIS — F410 Panic disorder [episodic paroxysmal anxiety] without agoraphobia: Secondary | ICD-10-CM

## 2019-07-09 DIAGNOSIS — R002 Palpitations: Secondary | ICD-10-CM

## 2019-07-09 DIAGNOSIS — F411 Generalized anxiety disorder: Secondary | ICD-10-CM | POA: Insufficient documentation

## 2019-07-09 DIAGNOSIS — R232 Flushing: Secondary | ICD-10-CM | POA: Insufficient documentation

## 2019-07-16 ENCOUNTER — Ambulatory Visit (INDEPENDENT_AMBULATORY_CARE_PROVIDER_SITE_OTHER): Payer: HMO | Admitting: Physician Assistant

## 2019-07-16 ENCOUNTER — Other Ambulatory Visit: Payer: Self-pay

## 2019-07-16 ENCOUNTER — Other Ambulatory Visit (INDEPENDENT_AMBULATORY_CARE_PROVIDER_SITE_OTHER): Payer: Self-pay | Admitting: Physician Assistant

## 2019-07-16 ENCOUNTER — Ambulatory Visit: Payer: HMO | Attending: Physician Assistant | Admitting: Physician Assistant

## 2019-07-16 ENCOUNTER — Ambulatory Visit (INDEPENDENT_AMBULATORY_CARE_PROVIDER_SITE_OTHER): Payer: HMO

## 2019-07-16 ENCOUNTER — Encounter (INDEPENDENT_AMBULATORY_CARE_PROVIDER_SITE_OTHER): Payer: Self-pay | Admitting: Physician Assistant

## 2019-07-16 VITALS — BP 115/58 | HR 86 | Temp 98.5°F | Resp 12 | Ht 61.0 in | Wt 131.0 lb

## 2019-07-16 DIAGNOSIS — K5900 Constipation, unspecified: Secondary | ICD-10-CM

## 2019-07-16 DIAGNOSIS — F411 Generalized anxiety disorder: Secondary | ICD-10-CM

## 2019-07-16 DIAGNOSIS — R7989 Other specified abnormal findings of blood chemistry: Secondary | ICD-10-CM | POA: Insufficient documentation

## 2019-07-16 DIAGNOSIS — F410 Panic disorder [episodic paroxysmal anxiety] without agoraphobia: Secondary | ICD-10-CM

## 2019-07-16 LAB — VITAMIN D 25 TOTAL: VITAMIN D 25, TOTAL: 147.9 ng/mL — ABNORMAL HIGH (ref 30.0–100.0)

## 2019-07-16 NOTE — Progress Notes (Signed)
FAMILY MED, [facility]   [address]  [place] [address]       Name: Elizabeth Hunter  MRN:  [id number]       Date: 07/16/2019  DOB:      Age: 79 y.o.             Reason for Visit: Follow-up    History of Present Illness  Elizabeth Hunter is a 79 y.o. female who is being seen today for f/u after testing completed. States she is feeling better at this time. States she is still very anxious at times only taking the xanax at bedtime. States she is no longer having to take a laxative to help with bowel movement and eating better.     Past Medical History:   Diagnosis Date   . Back ache    . Bowel trouble     obstruction/nonsurgical   . Chronic headaches    . Depression    . Fibromyalgia    . Osteoarthritis          Past Surgical History:   Procedure Laterality Date   . HX APPENDECTOMY     . HX CESAREAN SECTION      2   . HX CHOLECYSTECTOMY     . HX LAP CHOLECYSTECTOMY  2012   . HX SINUS SURGERY  2014         Current Outpatient Medications   Medication Sig   . ALPRAZolam (XANAX) 0.5 mg Oral Tablet Take 1 Tablet (0.5 mg total) by mouth Every night as needed for Insomnia or Anxiety   . citalopram (CELEXA) 40 mg Oral Tablet TAKE 1 TABLET DAILY   . docusate sodium (STOOL SOFTENER) 100 mg Oral Capsule Take 100 mg by mouth Once a day   . Herbal Drugs (COLON HERBAL CLEANSER) Oral Capsule Take by mouth   . HYDROcodone-acetaminophen (NORCO) 5-325 mg Oral Tablet Take 1 Tablet by mouth Every 6 hours as needed for Pain Only take twice a week   . meloxicam (MOBIC) 15 mg Oral Tablet Take 1 Tab (15 mg total) by mouth Once a day   . methocarbamoL (ROBAXIN) 500 mg Oral Tablet Take 1 Tab (500 mg total) by mouth Every night as needed     No Known Allergies  Family Medical History:     Problem Relation (Age of Onset)    Heart Attack Father (76), Sister (48)    Hypertension (High Blood Pressure) Mother (17)            Social History     Tobacco Use   . Smoking status: Never Smoker   . Smokeless tobacco: Never  Used   Vaping Use   . Vaping Use: Never used   Substance Use Topics   . Alcohol use: No   . Drug use: No       Nursing Notes  There are no exam notes on file for this visit.     Review of Systems  Review of Systems   Constitutional: Negative for chills, fever, malaise/fatigue and weight loss.   HENT: Negative for congestion, ear discharge, ear pain, hearing loss, nosebleeds, sinus pain and sore throat.    Eyes: Negative for blurred vision, photophobia and discharge.   Respiratory: Negative for cough, hemoptysis, sputum production, shortness of breath and wheezing.    Cardiovascular: Negative for chest pain, palpitations and leg swelling.   Gastrointestinal: Positive for constipation. Negative for abdominal pain, blood in stool, diarrhea, heartburn, nausea and  vomiting.   Genitourinary: Negative for dysuria, frequency, hematuria and urgency.   Musculoskeletal: Negative for back pain, falls, joint pain, myalgias and neck pain.   Skin: Negative for itching and rash.   Neurological: Negative for dizziness, tingling, tremors, weakness and headaches.   Endo/Heme/Allergies: Negative for environmental allergies. Does not bruise/bleed easily.   Psychiatric/Behavioral: Positive for memory loss. Negative for depression, substance abuse and suicidal ideas. The patient is nervous/anxious. The patient does not have insomnia.        Physical Exam:  BP (!) 115/58   Pulse 86   Temp 36.9 C (98.5 F)   Resp 12   Ht 1.549 m (5\' 1" )   Wt 59.4 kg (131 lb)   SpO2 98%   BMI 24.75 kg/m       Physical Exam  Vitals and nursing note reviewed.   Constitutional:       General: She is not in acute distress.     Appearance: She is not diaphoretic.   HENT:      Head: Normocephalic and atraumatic.   Eyes:      Conjunctiva/sclera: Conjunctivae normal.      Pupils: Pupils are equal, round, and reactive to light.   Cardiovascular:      Rate and Rhythm: Normal rate and regular rhythm.      Heart sounds: Normal heart sounds.   Pulmonary:       Effort: Pulmonary effort is normal.      Breath sounds: Normal breath sounds.   Abdominal:      General: Bowel sounds are decreased.      Palpations: Abdomen is soft.   Musculoskeletal:         General: Normal range of motion.   Skin:     General: Skin is warm and dry.   Neurological:      Mental Status: She is alert and oriented to person, place, and time.      Coordination: Coordination normal.      Gait: Gait is intact.   Psychiatric:         Mood and Affect: Mood and affect normal.         Cognition and Memory: Memory normal.         Judgment: Judgment normal.         Assessment and Plan    ENCOUNTER DIAGNOSES     ICD-10-CM   1. Constipation, unspecified constipation type  K59.00   2. Generalized anxiety disorder with panic attacks  F41.1    F41.0        I spent 20 minutes out of 30 minutes counseling her regarding: anxiety and constipation  Continue with xanax as needed  Continue with stool softener   Drink plenty of water  Discussed doing abdominal xray today, pt declines  Continue to advance and eat regular diet  Stay active    Reviewed  EKG  CXR   Lab work with the patient    Repeat vitamin D level today     Pt to keep appointment tomorrow for abdominal aortic ultrasound     Follow up: Return in about 4 weeks (around 08/13/2019), or if symptoms worsen or fail to improve.      This patient was seen independently.    Camillia Herter, PA-C  07/16/2019, 14:44

## 2019-07-17 ENCOUNTER — Ambulatory Visit
Admission: RE | Admit: 2019-07-17 | Discharge: 2019-07-17 | Disposition: A | Discharge status: Home / Self Care | Payer: HMO | Source: Ambulatory Visit | Attending: Physician Assistant | Admitting: Physician Assistant

## 2019-07-17 DIAGNOSIS — I77811 Abdominal aortic ectasia: Secondary | ICD-10-CM

## 2019-07-17 DIAGNOSIS — I714 Abdominal aortic aneurysm, without rupture, unspecified: Secondary | ICD-10-CM

## 2019-07-17 LAB — ABDOMINAL DUPLEX - AORTA  (INCLUDES ILIAC ARTERY)
Abdominal dist aorta AP: 1.4 cm
Abdominal dist aorta trans: 1.4 cm
Abdominal dist aorta vel: 91 cm/s
Abdominal lt com iliac AP: 1.1 cm
Abdominal lt com iliac trans: 1.1 cm
Abdominal lt com iliac vel: 109 cm/s
Abdominal mid aorta AP: 2.2 cm
Abdominal mid aorta trans: 2.5 cm
Abdominal mid aorta vel: 76 cm/s
Abdominal prox aorta AP: 1.7 cm
Abdominal prox aorta trans: 1.7 cm
Abdominal prox aorta vel: 83 cm/s
Abdominal rt com iliac AP: 1 cm
Abdominal rt com iliac trans: 1 cm
Abdominal rt com iliac vel: 96 cm/s

## 2019-07-23 ENCOUNTER — Other Ambulatory Visit (INDEPENDENT_AMBULATORY_CARE_PROVIDER_SITE_OTHER): Payer: Self-pay | Admitting: Physician Assistant

## 2019-07-23 DIAGNOSIS — I714 Abdominal aortic aneurysm, without rupture, unspecified: Secondary | ICD-10-CM

## 2019-07-30 ENCOUNTER — Other Ambulatory Visit (INDEPENDENT_AMBULATORY_CARE_PROVIDER_SITE_OTHER): Payer: HMO

## 2019-07-30 ENCOUNTER — Other Ambulatory Visit: Payer: Self-pay

## 2019-07-30 ENCOUNTER — Ambulatory Visit (INDEPENDENT_AMBULATORY_CARE_PROVIDER_SITE_OTHER): Payer: HMO

## 2019-07-30 ENCOUNTER — Encounter (INDEPENDENT_AMBULATORY_CARE_PROVIDER_SITE_OTHER): Payer: Self-pay | Admitting: Physician Assistant

## 2019-07-30 ENCOUNTER — Ambulatory Visit (INDEPENDENT_AMBULATORY_CARE_PROVIDER_SITE_OTHER): Payer: HMO | Admitting: Physician Assistant

## 2019-07-30 ENCOUNTER — Ambulatory Visit: Payer: HMO | Attending: Physician Assistant | Admitting: Physician Assistant

## 2019-07-30 VITALS — BP 135/65 | HR 77 | Temp 98.2°F | Resp 12 | Ht 61.0 in | Wt 131.0 lb

## 2019-07-30 DIAGNOSIS — K5900 Constipation, unspecified: Secondary | ICD-10-CM

## 2019-07-30 DIAGNOSIS — R7989 Other specified abnormal findings of blood chemistry: Secondary | ICD-10-CM | POA: Insufficient documentation

## 2019-07-30 DIAGNOSIS — R195 Other fecal abnormalities: Secondary | ICD-10-CM

## 2019-07-30 LAB — VITAMIN D 25 TOTAL: VITAMIN D 25, TOTAL: >154.2 ng/mL — ABNORMAL HIGH (ref 30.0–100.0)

## 2019-07-30 NOTE — Progress Notes (Signed)
FAMILY MED, [facility]   [address]  [place] [address]       Name: Elizabeth Hunter  MRN:  [id number]       Date: 07/30/2019  DOB:      Age: 79 y.o.             Reason for Visit: Follow-up    History of Present Illness  Elizabeth Hunter is a 79 y.o. female who is being seen today for     Past Medical History:   Diagnosis Date   . Back ache    . Bowel trouble     obstruction/nonsurgical   . Chronic headaches    . Depression    . Fibromyalgia    . Osteoarthritis          Past Surgical History:   Procedure Laterality Date   . HX APPENDECTOMY     . HX CESAREAN SECTION      2   . HX CHOLECYSTECTOMY     . HX LAP CHOLECYSTECTOMY  2012   . HX SINUS SURGERY  2014         Current Outpatient Medications   Medication Sig   . ALPRAZolam (XANAX) 0.5 mg Oral Tablet Take 1 Tablet (0.5 mg total) by mouth Every night as needed for Insomnia or Anxiety   . citalopram (CELEXA) 40 mg Oral Tablet TAKE 1 TABLET DAILY   . docusate sodium (STOOL SOFTENER) 100 mg Oral Capsule Take 100 mg by mouth Once a day   . Herbal Drugs (COLON HERBAL CLEANSER) Oral Capsule Take by mouth   . HYDROcodone-acetaminophen (NORCO) 5-325 mg Oral Tablet Take 1 Tablet by mouth Every 6 hours as needed for Pain Only take twice a week   . meloxicam (MOBIC) 15 mg Oral Tablet Take 1 Tab (15 mg total) by mouth Once a day   . methocarbamoL (ROBAXIN) 500 mg Oral Tablet Take 1 Tab (500 mg total) by mouth Every night as needed     No Known Allergies  Family Medical History:     Problem Relation (Age of Onset)    Heart Attack Father (72), Sister (39)    Hypertension (High Blood Pressure) Mother (47)            Social History     Tobacco Use   . Smoking status: Never Smoker   . Smokeless tobacco: Never Used   Vaping Use   . Vaping Use: Never used   Substance Use Topics   . Alcohol use: No   . Drug use: No       Nursing Notes  There are no exam notes on file for this visit.     Review of Systems  Review of Systems   Constitutional: Negative for  chills, fever, malaise/fatigue and weight loss.   HENT: Negative for congestion, ear discharge, ear pain, hearing loss, nosebleeds, sinus pain and sore throat.    Eyes: Negative for blurred vision, photophobia and discharge.   Respiratory: Negative for cough, hemoptysis, sputum production, shortness of breath and wheezing.    Cardiovascular: Negative for chest pain, palpitations and leg swelling.   Gastrointestinal: Positive for constipation. Negative for abdominal pain, blood in stool, diarrhea, heartburn, nausea and vomiting.        Gas   Genitourinary: Negative for dysuria, frequency, hematuria and urgency.   Musculoskeletal: Negative for back pain, falls, joint pain, myalgias and neck pain.   Skin: Negative for itching and rash.   Neurological:  Negative for dizziness, tingling, tremors, weakness and headaches.   Endo/Heme/Allergies: Negative for environmental allergies. Does not bruise/bleed easily.   Psychiatric/Behavioral: Negative for depression, memory loss, substance abuse and suicidal ideas. The patient is nervous/anxious and has insomnia.         Taking xanax at bedtime       Physical Exam:  BP 135/65   Pulse 77   Temp 36.8 C (98.2 F)   Resp 12   Ht 1.549 m (5\' 1" )   Wt 59.4 kg (131 lb)   SpO2 98%   BMI 24.75 kg/m       Physical Exam  Vitals and nursing note reviewed.   Constitutional:       General: She is not in acute distress.     Appearance: She is not diaphoretic.   HENT:      Head: Normocephalic and atraumatic.   Eyes:      Conjunctiva/sclera: Conjunctivae normal.      Pupils: Pupils are equal, round, and reactive to light.   Cardiovascular:      Rate and Rhythm: Normal rate and regular rhythm.      Heart sounds: Normal heart sounds.   Pulmonary:      Effort: Pulmonary effort is normal.      Breath sounds: Normal breath sounds.   Abdominal:      General: Bowel sounds are decreased.      Palpations: Abdomen is soft.   Musculoskeletal:         General: Normal range of motion.   Skin:      General: Skin is warm and dry.   Neurological:      Mental Status: She is alert and oriented to person, place, and time.      Coordination: Coordination normal.      Gait: Gait is intact.   Psychiatric:         Mood and Affect: Mood and affect normal.         Cognition and Memory: Memory normal.         Judgment: Judgment normal.         Assessment and Plan    ENCOUNTER DIAGNOSES     ICD-10-CM   1. Constipation, unspecified constipation type  K59.00   2. Change in stool  R19.5        I spent 20 minutes out of 25 minutes counseling her regarding: constipation and stool changes. We will have the patient have abdominal xray completed today at the [facility] side of the office.   Continue with stool softener  Continue with eating fruits and veggies  Drink plenty of water daily  We will refer to Dr. office for consult  Has had a negative experience with colonoscopy about 5 years ago      Orders Placed This Encounter   . XR KUB AND UPRIGHT ABDOMEN     Follow up: Return in about 6 weeks (around 09/10/2019), or if symptoms worsen or fail to improve.      This patient was seen independently.    11/10/2019, PA-C  07/30/2019, 14:48

## 2019-07-31 ENCOUNTER — Ambulatory Visit (INDEPENDENT_AMBULATORY_CARE_PROVIDER_SITE_OTHER): Payer: Self-pay | Admitting: VASCULAR SURGERY

## 2019-07-31 LAB — ECG 12-LEAD
Atrial Rate: 66 {beats}/min
Calculated P Axis: 52 degrees
Calculated R Axis: 11 degrees
Calculated T Axis: 43 degrees
PR Interval: 152 ms
QRS Duration: 92 ms
QT Interval: 402 ms
QTC Calculation: 421 ms
Ventricular rate: 66 {beats}/min

## 2019-08-02 ENCOUNTER — Other Ambulatory Visit (INDEPENDENT_AMBULATORY_CARE_PROVIDER_SITE_OTHER): Payer: Self-pay | Admitting: Physician Assistant

## 2019-08-02 DIAGNOSIS — R7989 Other specified abnormal findings of blood chemistry: Secondary | ICD-10-CM

## 2019-08-03 ENCOUNTER — Other Ambulatory Visit (INDEPENDENT_AMBULATORY_CARE_PROVIDER_SITE_OTHER): Payer: Self-pay | Admitting: Physician Assistant

## 2019-08-03 DIAGNOSIS — R7989 Other specified abnormal findings of blood chemistry: Secondary | ICD-10-CM

## 2019-08-13 ENCOUNTER — Other Ambulatory Visit: Payer: Self-pay

## 2019-08-13 ENCOUNTER — Ambulatory Visit (INDEPENDENT_AMBULATORY_CARE_PROVIDER_SITE_OTHER): Payer: HMO

## 2019-08-13 ENCOUNTER — Ambulatory Visit: Payer: HMO | Attending: Physician Assistant | Admitting: Physician Assistant

## 2019-08-13 ENCOUNTER — Other Ambulatory Visit (INDEPENDENT_AMBULATORY_CARE_PROVIDER_SITE_OTHER): Payer: Self-pay | Admitting: Family Medicine

## 2019-08-13 DIAGNOSIS — F410 Panic disorder [episodic paroxysmal anxiety] without agoraphobia: Secondary | ICD-10-CM

## 2019-08-13 DIAGNOSIS — R7989 Other specified abnormal findings of blood chemistry: Secondary | ICD-10-CM

## 2019-08-13 LAB — VITAMIN D 25 TOTAL: VITAMIN D 25, TOTAL: 124.2 ng/mL — ABNORMAL HIGH (ref 30.0–100.0)

## 2019-08-13 MED ORDER — ALPRAZOLAM 0.5 MG TABLET
0.50 mg | ORAL_TABLET | Freq: Three times a day (TID) | ORAL | 3 refills | Status: DC | PRN
Start: 2019-08-13 — End: 2020-03-05

## 2019-08-13 NOTE — Telephone Encounter (Signed)
Pt wants to pick up rx  When she gets labs later today

## 2019-08-14 ENCOUNTER — Ambulatory Visit (INDEPENDENT_AMBULATORY_CARE_PROVIDER_SITE_OTHER): Payer: HMO | Admitting: VASCULAR SURGERY

## 2019-08-14 ENCOUNTER — Other Ambulatory Visit (INDEPENDENT_AMBULATORY_CARE_PROVIDER_SITE_OTHER): Payer: Self-pay | Admitting: Physician Assistant

## 2019-08-14 ENCOUNTER — Encounter (INDEPENDENT_AMBULATORY_CARE_PROVIDER_SITE_OTHER): Payer: Self-pay | Admitting: VASCULAR SURGERY

## 2019-08-14 VITALS — BP 122/60 | HR 72 | Ht 61.0 in | Wt 132.7 lb

## 2019-08-14 DIAGNOSIS — I714 Abdominal aortic aneurysm, without rupture, unspecified: Secondary | ICD-10-CM

## 2019-08-14 DIAGNOSIS — R0989 Other specified symptoms and signs involving the circulatory and respiratory systems: Secondary | ICD-10-CM

## 2019-08-14 DIAGNOSIS — R7989 Other specified abnormal findings of blood chemistry: Secondary | ICD-10-CM

## 2019-08-14 NOTE — Progress Notes (Signed)
Department of Vascular Surgery  [facility]  Outpatient Clinic History and Physical    Date: 08/14/2019  Patient: Elizabeth Hunter  DOB:   PCP: Cecilie Kicks, DO    Chief Complaint: small aortic aneurysm  Chief Complaint   Patient presents with   . New Patient     AAA/ Splenic Artery Aneurysm / ABD Duplex 07/30/2019       Subjective:     HPI: Elizabeth Hunter is a 79 y.o. White female who presents for evaluation of a small abdominal aortic aneurysm.  The patient's mother died of a ruptured abdominal aortic aneurysm.  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  It is stable at 2.5 cm in diameter.    The patient also complains of constipation and has had a KUB ordered.  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  The patient was unaware of this diagnosis.    On physical exam the patient has left carotid bruit which is faint.  No history of stroke mini stroke amaurosis fugax or expressive aphasia.  No previous history of myocardial infarction chest pain or exertional dyspnea.Marland Kitchen    PMH:   Past Medical History:   Diagnosis Date   . AAA (abdominal aortic aneurysm) (CMS HCC)    . Back ache    . Bowel trouble     obstruction/nonsurgical   . Chronic headaches    . Depression    . Fibromyalgia    . Osteoarthritis    . Splenic artery aneurysm (CMS HCC)            Family Hx:  Family Medical History:     Problem Relation (Age of Onset)    Heart Attack Father (44), Sister (26)    Hypertension (High Blood Pressure) Mother (66)              Medications:  Current Outpatient Medications   Medication Sig Dispense Refill   . ALPRAZolam (XANAX) 0.5 mg Oral Tablet Take 1 Tablet (0.5 mg total) by mouth Every 8 hours as needed for Insomnia or Anxiety 90 Tablet 3   . ascorbic acid, vitamin C, (VITAMIN C) 250 mg Oral Tablet Take 250 mg by mouth Once a day     . calcium carbonate/vitamin D3 (CALCIUM 500 + D ORAL) Take by mouth     . citalopram (CELEXA) 40 mg Oral Tablet TAKE 1 TABLET DAILY 90 Tab 3   . docusate  sodium (STOOL SOFTENER) 100 mg Oral Capsule Take 100 mg by mouth Once a day     . Herbal Drugs (COLON HERBAL CLEANSER) Oral Capsule Take by mouth     . HYDROcodone-acetaminophen (NORCO) 5-325 mg Oral Tablet Take 1 Tablet by mouth Every 6 hours as needed for Pain Only take twice a week 28 Tablet 0   . Magnesium 200 mg Oral Tablet Take by mouth     . meloxicam (MOBIC) 15 mg Oral Tablet Take 1 Tab (15 mg total) by mouth Once a day 90 Tab 3   . methocarbamoL (ROBAXIN) 500 mg Oral Tablet Take 1 Tab (500 mg total) by mouth Every night as needed 30 Tab 0   . vitamin E 100 unit Oral Capsule Take 100 Units by mouth Once a day       No current facility-administered medications for this visit.       Allergies:  No Known Allergies    Past Surgical Hx:  Past Surgical History:   Procedure Laterality Date   .  HX APPENDECTOMY     . HX CESAREAN SECTION      2   . HX CHOLECYSTECTOMY     . HX COLONOSCOPY     . HX LAP CHOLECYSTECTOMY  2012   . HX SINUS SURGERY  2014   . HX WISDOM TEETH EXTRACTION             Social Hx:  Social History     Socioeconomic History   . Marital status: Married     Spouse name: Not on file   . Number of children: Not on file   . Years of education: Not on file   . Highest education level: Not on file   Occupational History   . Occupation: SELF     Comment: [employer]   Tobacco Use   . Smoking status: Never Smoker   . Smokeless tobacco: Never Used   Vaping Use   . Vaping Use: Never used   Substance and Sexual Activity   . Alcohol use: No   . Drug use: No   . Sexual activity: Not Currently     Comment: did not ask   Other Topics Concern   . Abuse/Domestic Violence Not Asked   . Breast Self Exam Not Asked   . Caffeine Concern Not Asked   . Calcium intake adequate Not Asked   . Computer Use Not Asked   . Drives Yes   . Exercise Concern Not Asked   . Helmet Use Not Asked   . Seat Belt Not Asked   . Special Diet Not Asked   . Sunscreen used Not Asked   . Uses Cane Yes   . Uses walker Yes   . Uses wheelchair Yes    . Right hand dominant Yes   . Left hand dominant Not Asked   . Ambidextrous Not Asked   . Shift Work Not Asked   . Unusual Sleep-Wake Schedule Not Asked   Social History Narrative   . Not on file     Social Determinants of Health     Financial Resource Strain:    . Difficulty of Paying Living Expenses:    Food Insecurity:    . Worried About Programme researcher, broadcasting/film/video in the Last Year:    . Barista in the Last Year:    Transportation Needs:    . Freight forwarder (Medical):    Marland Kitchen Lack of Transportation (Non-Medical):    Physical Activity:    . Days of Exercise per Week:    . Minutes of Exercise per Session:    Stress:    . Feeling of Stress :    Intimate Partner Violence:    . Fear of Current or Ex-Partner:    . Emotionally Abused:    Marland Kitchen Physically Abused:    . Sexually Abused:        Review of Systems:  Constitutional: negative for fevers, chills, sweats and fatigue  Eyes: negative for blurred vision or field defects  HEENT: negative for hearing loss  Respiratory: negative for cough and shortness of breath  Cardiovascular: negative for chest pain, negative for claudication  Gastrointestinal:  Positive for constipation  Genitourinary: negative for dysuria, continues to urinate  Musculoskeletal: As in HPI  Neurological: negative for unilateral weakness or monocular blindness  Integumentary: negative for wounds or ulcers    Objective:    PHYSICAL EXAM:  Vitals: BP 122/60 (Site: Right, Patient Position: Sitting)   Pulse 72   Ht  1.549 m (5\' 1" )   Wt 60.2 kg (132 lb 11.2 oz)   SpO2 96%   BMI 25.07 kg/m       General: AA&O X3. Nontoxic.  Well developed and well nourished in no acute distress   HENT: Head is normocephalic, atraumatic, oropharynx is moist  Eyes: Pupils equal and round and reactive to light; conjunctiva clear. Sclera without icterus; EOMO grossly intact.    Neck: Normal ROM, Supple, symmetrical  Lungs: Effort normal, clear to auscultation bilaterally.   Cardiovascular: Heart regular rate and rhythm,  S1, S2 normal, no murmur, click, rub or gallop  Vascular:     Left carotid bruit:  present, Right carotid bruit:  absent   Left brachial artery:  2+ (normal),  Right brachial artery:  2+ (normal),    Left radial artery:  2+ (normal),  Right radial artery:  2+ (normal),      Left femoral artery:  2+ (normal),  Right femoral artery:  2+ (normal),      Left popliteal artery: non-aneurysmal, Right popliteal artery:  non-aneurysmal   Left dorsalis pedis artery:  2+ (normal), Right dorsalis pedis artery:   2+ (normal),     Left posterior tibial artery: 2+ (normal) Right posterior tibeal artery:   2+ (normal)   Abdomen: Bowel sounds normal; soft, non distended non-tender to palpation, no rebound or guarding present. No palpable masses.  Extremities: no cyanosis or edema  Skin:  Skin warm and dry      DATA:     I have independently reviewed and interpreted available imaging.  My interpretations are summarized below.      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    Patient was given the opportunity to ask questions and those questions were answered to their satisfaction. Instructed to call with any further questions or concerns.     , MD

## 2019-08-27 ENCOUNTER — Ambulatory Visit: Payer: HMO | Attending: Physician Assistant | Admitting: Physician Assistant

## 2019-08-27 ENCOUNTER — Ambulatory Visit (INDEPENDENT_AMBULATORY_CARE_PROVIDER_SITE_OTHER): Payer: HMO

## 2019-08-27 ENCOUNTER — Other Ambulatory Visit: Payer: Self-pay

## 2019-08-27 DIAGNOSIS — R7989 Other specified abnormal findings of blood chemistry: Secondary | ICD-10-CM

## 2019-08-27 LAB — VITAMIN D 25 TOTAL: VITAMIN D 25, TOTAL: 120.9 ng/mL — ABNORMAL HIGH (ref 30.0–100.0)

## 2019-09-05 ENCOUNTER — Other Ambulatory Visit (INDEPENDENT_AMBULATORY_CARE_PROVIDER_SITE_OTHER): Payer: Self-pay | Admitting: Physician Assistant

## 2019-09-05 DIAGNOSIS — R7989 Other specified abnormal findings of blood chemistry: Secondary | ICD-10-CM

## 2019-09-10 ENCOUNTER — Other Ambulatory Visit: Payer: Self-pay

## 2019-09-10 ENCOUNTER — Inpatient Hospital Stay (INDEPENDENT_AMBULATORY_CARE_PROVIDER_SITE_OTHER)
Admission: RE | Admit: 2019-09-10 | Discharge: 2019-09-10 | Disposition: A | Discharge status: Home / Self Care | Payer: HMO | Source: Ambulatory Visit

## 2019-09-10 DIAGNOSIS — I6523 Occlusion and stenosis of bilateral carotid arteries: Secondary | ICD-10-CM

## 2019-09-10 DIAGNOSIS — R0989 Other specified symptoms and signs involving the circulatory and respiratory systems: Secondary | ICD-10-CM

## 2019-09-11 ENCOUNTER — Telehealth (INDEPENDENT_AMBULATORY_CARE_PROVIDER_SITE_OTHER): Payer: Self-pay | Admitting: Family Medicine

## 2019-09-11 ENCOUNTER — Encounter (INDEPENDENT_AMBULATORY_CARE_PROVIDER_SITE_OTHER): Payer: Self-pay | Admitting: VASCULAR SURGERY

## 2019-09-11 ENCOUNTER — Telehealth (INDEPENDENT_AMBULATORY_CARE_PROVIDER_SITE_OTHER): Payer: Self-pay | Admitting: PHYSICIAN ASSISTANT

## 2019-09-11 DIAGNOSIS — I728 Aneurysm of other specified arteries: Secondary | ICD-10-CM

## 2019-09-11 DIAGNOSIS — I779 Disorder of arteries and arterioles, unspecified: Secondary | ICD-10-CM

## 2019-09-11 LAB — CAROTID ARTERY DUPLEX
Left CCA dist dias: 21 cm/s
Left CCA dist sys: 86 cm/s
Left CCA mid dias: 13
Left CCA mid dias: 21
Left CCA mid sys: 80
Left CCA prox dias: 21 cm/s
Left CCA prox sys: 91 cm/s
Left Carotid Bubl Sys: 77
Left Carotid Bulb Dias: 23
Left ECA sys: 121 cm/s
Left ICA dist dias: 36 cm/s
Left ICA dist sys: 116 cm/s
Left ICA mid dias: 42 cm/s
Left ICA mid sys: 129 cm/s
Left ICA prox dias: 24 cm/s
Left ICA prox sys: 96 cm/s
Left ICA/CCA sys: 1.6
Left vertebral sys: 44 cm/s
Right CCA dist dias: 16 cm/s
Right CCA mid sys: 79
Right CCA prox dias: 18 cm/s
Right CCA prox sys: 99 cm/s
Right Carotid Bulb Dias: 18
Right Carotid Bulb Sys: 76
Right ICA dist dias: 30 cm/s
Right ICA dist sys: 95 cm/s
Right ICA mid dias: 29 cm/s
Right ICA mid sys: 93 cm/s
Right ICA prox dias: 16 cm/s
Right ICA prox sys: 92 cm/s
Right ICA/CCA sys: 1.2 cm/s
Right cca dist sys: 77 cm/s
Right eca sys: 132 cm/s
Right vertebral sys: 64 cm/s

## 2019-09-11 NOTE — Telephone Encounter (Addendum)
Spoke to patient.  She was informed of her carotid duplex results.  She has less than 50% stenosis noted bilaterally.  She also has a calcified 1.4 cm splenic artery aneurysm seen on the KUB x-ray.  She also has a small 2.5 cm abdominal aortic aneurysm.  We would like to follow her in 6 months with a repeat carotid duplex at the [facility] clinic.  She will have a KUB x-ray from 1-2 days prior to her procedure.  We will send her an order to have this performed.  With regard to her abdominal aortic aneurysm she will require an annual duplex which will be in June of 2022 those arrangements will be made at her follow-up visit in 6 months.    Gerilyn Nestle, PA-C  09/11/2019, 11:11      ----- Message from Christeen Douglas sent at 09/11/2019  9:26 AM EDT -----  She was in the office yesterday for a carotid doppler and had a follow up this morning, she has diarrhea and cannot come in.  She would like the results of the test, she did not reschedule but would like to know when she should come in based on the results.  Thanks,  Missy

## 2019-09-11 NOTE — Telephone Encounter (Signed)
PT IS STILL AWAITING APPOINTMENT WITH DR Lyanne Co.    She IS ANXIOUSLY AWAITING

## 2019-09-12 ENCOUNTER — Telehealth (INDEPENDENT_AMBULATORY_CARE_PROVIDER_SITE_OTHER): Payer: Self-pay | Admitting: Physician Assistant

## 2019-09-12 ENCOUNTER — Telehealth (INDEPENDENT_AMBULATORY_CARE_PROVIDER_SITE_OTHER): Payer: Self-pay | Admitting: Family Medicine

## 2019-09-12 DIAGNOSIS — R195 Other fecal abnormalities: Secondary | ICD-10-CM

## 2019-09-12 DIAGNOSIS — K5900 Constipation, unspecified: Secondary | ICD-10-CM

## 2019-09-12 NOTE — Telephone Encounter (Signed)
Pt said she has had continued bowel problems.  She suffers from severe constipation and the at times bad diarrhea  She feels she would benefit from a gastro. Referral.  She is awaiting a referral to dr Lyanne Co, but said she could put that on the back burner for now because her bowels are a more urgent problem right now

## 2019-09-12 NOTE — Telephone Encounter (Signed)
Pc spoke with  Husband sabe  Keft message for Mar advise  Per dr. Lyanne Co office they advise to send   to  [facility].  Office will call back when we get her scheduled with new provider dlh

## 2019-09-13 ENCOUNTER — Telehealth (INDEPENDENT_AMBULATORY_CARE_PROVIDER_SITE_OTHER): Payer: Self-pay | Admitting: Physician Assistant

## 2019-09-13 NOTE — Telephone Encounter (Signed)
pc spoke with Tereasa advise dr. Lyanne Co  advise office  to send referral to   [facility]. provider. where would you like to go. she advise  she does not needed to see anyone at this time. she has her referral for GI

## 2019-09-21 ENCOUNTER — Telehealth (INDEPENDENT_AMBULATORY_CARE_PROVIDER_SITE_OTHER): Payer: Self-pay | Admitting: Physician Assistant

## 2019-09-21 NOTE — Telephone Encounter (Signed)
Pc left vm for Marilyne  With appt. Date for dr. Claire Shown . 11/14/19 at 2:40 pm  arrive at 2:15  [facility]. [address]. dlh

## 2019-10-01 ENCOUNTER — Ambulatory Visit: Payer: HMO | Attending: Physician Assistant | Admitting: Physician Assistant

## 2019-10-01 ENCOUNTER — Other Ambulatory Visit: Payer: Self-pay

## 2019-10-01 ENCOUNTER — Ambulatory Visit (INDEPENDENT_AMBULATORY_CARE_PROVIDER_SITE_OTHER): Payer: HMO

## 2019-10-01 DIAGNOSIS — R7989 Other specified abnormal findings of blood chemistry: Secondary | ICD-10-CM | POA: Insufficient documentation

## 2019-10-01 LAB — VITAMIN D 25 TOTAL: VITAMIN D 25, TOTAL: 109.7 ng/mL — ABNORMAL HIGH (ref 30.0–100.0)

## 2019-10-02 ENCOUNTER — Ambulatory Visit (INDEPENDENT_AMBULATORY_CARE_PROVIDER_SITE_OTHER): Payer: HMO | Admitting: Physician Assistant

## 2019-10-02 ENCOUNTER — Encounter (INDEPENDENT_AMBULATORY_CARE_PROVIDER_SITE_OTHER): Payer: Self-pay | Admitting: Physician Assistant

## 2019-10-02 VITALS — BP 120/68 | HR 94 | Temp 98.7°F | Resp 12 | Ht 61.0 in | Wt 135.0 lb

## 2019-10-02 DIAGNOSIS — I714 Abdominal aortic aneurysm, without rupture, unspecified: Secondary | ICD-10-CM

## 2019-10-02 DIAGNOSIS — K5900 Constipation, unspecified: Secondary | ICD-10-CM

## 2019-10-02 DIAGNOSIS — R7989 Other specified abnormal findings of blood chemistry: Secondary | ICD-10-CM

## 2019-10-02 NOTE — Progress Notes (Signed)
FAMILY MEDICINE, [facility]  [address]  [place] [address]       Name: Elizabeth Hunter  MRN:  [id number]       Date: 10/02/2019  DOB:      Age: 79 y.o.             Reason for Visit: Follow-up    History of Present Illness  Elizabeth Hunter is a 79 y.o. female who is being seen today for constipation f/u. States she did have appointment with GI yesterday with Karsten Ro. Pt states her bowel movements have actually improved this past weekend. Reports GI doesn't feel colonoscopy is needed at this time, last one was completed in 2016.     Pt states she had her vitamin d level rechecked yesterday. Holding vitamin D supplement.    Pt deals with chronic low back. Not taking the oral pain medications often.     Pt also states she has followed with vascular surgeon for AAA and carotid duplex.    Past Medical History:   Diagnosis Date   . AAA (abdominal aortic aneurysm) (CMS [facility])    . Back ache    . Bowel trouble     obstruction/nonsurgical   . Chronic headaches    . Depression    . Fibromyalgia    . Osteoarthritis    . Splenic artery aneurysm (CMS [facility])          Past Surgical History:   Procedure Laterality Date   . HX APPENDECTOMY     . HX CESAREAN SECTION      2   . HX CHOLECYSTECTOMY     . HX COLONOSCOPY     . HX LAP CHOLECYSTECTOMY  2012   . HX SINUS SURGERY  2014   . HX WISDOM TEETH EXTRACTION           Current Outpatient Medications   Medication Sig   . ALPRAZolam (XANAX) 0.5 mg Oral Tablet Take 1 Tablet (0.5 mg total) by mouth Every 8 hours as needed for Insomnia or Anxiety   . ascorbic acid, vitamin C, (VITAMIN C) 250 mg Oral Tablet Take 250 mg by mouth Once a day   . calcium carbonate/vitamin D3 (CALCIUM 500 + D ORAL) Take by mouth (Patient not taking: Reported on 10/02/2019)   . citalopram (CELEXA) 40 mg Oral Tablet TAKE 1 TABLET DAILY   . docusate sodium (STOOL SOFTENER) 100 mg Oral Capsule Take 100 mg by mouth Once a day   . Herbal Drugs (COLON HERBAL CLEANSER) Oral Capsule Take  by mouth   . HYDROcodone-acetaminophen (NORCO) 5-325 mg Oral Tablet Take 1 Tablet by mouth Every 6 hours as needed for Pain Only take twice a week   . Magnesium 200 mg Oral Tablet Take by mouth (Patient not taking: Reported on 10/02/2019)   . meloxicam (MOBIC) 15 mg Oral Tablet Take 1 Tab (15 mg total) by mouth Once a day   . methocarbamoL (ROBAXIN) 500 mg Oral Tablet Take 1 Tab (500 mg total) by mouth Every night as needed (Patient not taking: Reported on 10/02/2019)   . vitamin E 100 unit Oral Capsule Take 100 Units by mouth Once a day     No Known Allergies  Family Medical History:     Problem Relation (Age of Onset)    Heart Attack Father (55), Sister (58)    Hypertension (High Blood Pressure) Mother (20)  Social History     Tobacco Use   . Smoking status: Never Smoker   . Smokeless tobacco: Never Used   Vaping Use   . Vaping Use: Never used   Substance Use Topics   . Alcohol use: No   . Drug use: No       Nursing Notes  There are no exam notes on file for this visit.     Review of Systems  Review of Systems   Constitutional: Negative for chills, fever, malaise/fatigue and weight loss.   HENT: Negative for congestion, ear discharge, ear pain, hearing loss, nosebleeds, sinus pain and sore throat.    Eyes: Negative for blurred vision, photophobia and discharge.   Respiratory: Negative for cough, hemoptysis, sputum production, shortness of breath and wheezing.    Cardiovascular: Negative for chest pain, palpitations and leg swelling.   Gastrointestinal: Positive for constipation. Negative for abdominal pain, blood in stool, diarrhea, heartburn, nausea and vomiting.        Improved    Genitourinary: Negative for dysuria, frequency, hematuria and urgency.   Musculoskeletal: Negative for back pain, falls, joint pain, myalgias and neck pain.   Skin: Negative for itching and rash.   Neurological: Negative for dizziness, tingling, tremors, weakness and headaches.   Endo/Heme/Allergies: Negative for environmental  allergies. Does not bruise/bleed easily.   Psychiatric/Behavioral: Negative for depression, memory loss, substance abuse and suicidal ideas. The patient is nervous/anxious. The patient does not have insomnia.        Physical Exam:  BP 120/68   Pulse 94   Temp 37.1 C (98.7 F)   Resp 12   Ht 1.549 m (5\' 1" )   Wt 61.2 kg (135 lb)   SpO2 96%   BMI 25.51 kg/m       Physical Exam  Vitals and nursing note reviewed.   Constitutional:       General: She is not in acute distress.     Appearance: She is not diaphoretic.   HENT:      Head: Normocephalic and atraumatic.   Eyes:      Conjunctiva/sclera: Conjunctivae normal.      Pupils: Pupils are equal, round, and reactive to light.   Cardiovascular:      Rate and Rhythm: Normal rate and regular rhythm.      Heart sounds: Normal heart sounds.   Pulmonary:      Effort: Pulmonary effort is normal.      Breath sounds: Normal breath sounds.   Abdominal:      General: Bowel sounds are normal.      Palpations: Abdomen is soft.      Tenderness: There is no abdominal tenderness.   Skin:     General: Skin is warm and dry.   Neurological:      Mental Status: She is alert and oriented to person, place, and time.      Coordination: Coordination normal.      Gait: Gait is intact.   Psychiatric:         Mood and Affect: Mood and affect normal.         Cognition and Memory: Memory normal.         Judgment: Judgment normal.         Assessment and Plan    ENCOUNTER DIAGNOSES     ICD-10-CM   1. Constipation, unspecified constipation type  K59.00   2. High serum vitamin D  R79.89   3. Abdominal aortic aneurysm (AAA) without rupture (CMS  HCC)  I71.4      VITAMIN D 25 TOTAL  Order: [id number]  Status:  Final result Visible to patient:  Yes (seen) Next appt:  03/11/2020 at 01:30 PM in Cardiology ([facility]) Dx:  High serum vitamin D   1 Result Note  Component   Ref Range & Units 1 d ago   (10/01/19) 1 mo ago   (08/27/19) 1 mo ago   (08/13/19) 2 mo ago   (07/30/19) 2 mo ago   (07/16/19)    VITAMIN D 25, TOTAL   30.0 - 100.0 ng/mL 109.7High  120.9High CM  124.2High CM  >154.2High CM  147.9High CM    Comment: <20 ng/mL, consistent with vitamin D deficiency   21-29 ng/mL, vitamin D insufficiency   30-100 ng/mL, vitamin D replete   >100 ng/mL, potentially symptomatic vitamin D toxicity   Deficiency states reflect increased risk of bone disease.   [facility]ing Agency [facility]         Specimen Collected: 10/01/19 11:30 Last Resulted: 10/01/19 22:22           Will repeat vitamin D level in 2 months         [facility] - [facility] Non-In[facility]  [address]  [place], [place] [address]  Phone: [phone] Fax: [phone]    ABDOMINAL DUPLEX - AORTA  [facility] ILIAC ARTERY)     Name:  Lilygrace Rodick Age/Sex:  68 yrs female Ordering Physician: Read Drivers, PA-C   DOB:    Height:   Attending Physician: Read Drivers   Med Rec Num:  [id number] Weight:   Exam Date: 07/17/2019 10:07 AM   CSN Num: [id number] BSA:   Sonographer: Huggins-McNeillWaynetta Sandy, RTR   Accession Number: [id number] BSA Index:   Fellow:      Patient Class:  Outpatient 07/17/2019               Blood Pressure:   Interpreted By: Jabier Gauss, MD       Indications:       AAA (abdominal aortic aneurysm) (CMS [facility])                                Questions  Does the Patient have a Graft?  No   ABDOMINAL DUPLEX - AORTA (INCLUDES ILIAC ARTERY): Result Notes   Cameron Sprang, Ambulatory Care Assistant   07/23/2019 10:16 AM EDT       Patient notified. Referred to [facility] at [facility]. 07/23/19 elt    Cameron Sprang, Ambulatory Care Assistant   07/19/2019 10:47 AM EDT       LM asking patient for a call back regarding this message. Await that call. 07/19/19 elt    Read Drivers, PA-C   07/18/2019 8:43 AM EDT       Please let the patient know that her ultrasound of abdominal aorta does show dilatation, i would like her to get est with vascular surgeon for continued f/u.   We could see if she  would like to see DR. Wickel          Vascular Findings    Abdominal Aorta Stent graft: not present   in the middle aorta. There is no thrombus present.       Summary    Reviewing Sonographer Impression:    Patient was NPO for this study.     1. Ectatic dilatation  of the mid abdominal aorta measuring 2.2 x 2.5 cm.   2. No aneurysmal dilatation of the common iliac arteries.   3. Flow velocities are normal in the abdominal aorta and common iliac arteries.    Reviewing Sonographer: Kennith CenterSarah E Sherman, RDMS,RVT 07/17/19 1:41 PM  This is a preliminary report until verified by a physician.     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 Jabier Gaussean WIckel office notes with the patient  Recommended repeat AAA ultrasound in 1 year    Keep f/u appointment with GI  Continue with current medications  Advance diet   Continue to drink plenty of water daily    Follow up: Return in about 6 months (around 03/31/2020), or if symptoms worsen or fail to improve.      This patient was seen independently.    Read Drivershelsey Joshwa Hemric, PA-C  10/02/2019, 14:17

## 2019-10-22 ENCOUNTER — Ambulatory Visit: Payer: HMO | Attending: Physician Assistant | Admitting: Physician Assistant

## 2019-10-22 ENCOUNTER — Ambulatory Visit (INDEPENDENT_AMBULATORY_CARE_PROVIDER_SITE_OTHER): Payer: HMO

## 2019-10-22 ENCOUNTER — Other Ambulatory Visit: Payer: Self-pay

## 2019-10-22 ENCOUNTER — Other Ambulatory Visit (INDEPENDENT_AMBULATORY_CARE_PROVIDER_SITE_OTHER): Payer: Self-pay | Admitting: Physician Assistant

## 2019-10-22 DIAGNOSIS — R7989 Other specified abnormal findings of blood chemistry: Secondary | ICD-10-CM

## 2019-10-22 LAB — VITAMIN D 25 TOTAL: VITAMIN D 25, TOTAL: 108.3 ng/mL — ABNORMAL HIGH (ref 30.0–100.0)

## 2019-10-24 ENCOUNTER — Other Ambulatory Visit: Payer: Self-pay

## 2019-10-24 ENCOUNTER — Ambulatory Visit (INDEPENDENT_AMBULATORY_CARE_PROVIDER_SITE_OTHER): Payer: HMO | Admitting: Physician Assistant

## 2019-10-24 ENCOUNTER — Encounter (INDEPENDENT_AMBULATORY_CARE_PROVIDER_SITE_OTHER): Payer: Self-pay | Admitting: Physician Assistant

## 2019-10-24 ENCOUNTER — Telehealth (INDEPENDENT_AMBULATORY_CARE_PROVIDER_SITE_OTHER): Payer: Self-pay | Admitting: Family Medicine

## 2019-10-24 VITALS — BP 116/64 | HR 101 | Temp 98.7°F | Resp 12 | Ht 61.0 in | Wt 129.4 lb

## 2019-10-24 DIAGNOSIS — K5900 Constipation, unspecified: Secondary | ICD-10-CM

## 2019-10-24 MED ORDER — POLYETHYLENE GLYCOL 3350 17 GRAM/DOSE ORAL POWDER
17.00 g | Freq: Every day | ORAL | 0 refills | Status: DC
Start: 2019-10-24 — End: 2020-01-22

## 2019-10-24 NOTE — Progress Notes (Signed)
FAMILY MEDICINE, [facility]  [address]  [place] [address]       Name: Elizabeth Hunter  MRN:  [id number]       Date: 10/24/2019  DOB:      Age: 79 y.o.             Reason for Visit: Follow-up (discuss Vit D issues)    History of Present Illness  Elizabeth Hunter is a 79 y.o. female who is being seen today for continued complaints of constipation. Has up coming appointment with GI on 11/12/19. States she continues to feel she is not completely having a full bowel movement. States she has changed her diet. Has had weight loss. Trying to eat fruits and veggies daily. States she isn't even eating her potatoe chips that she really enjoys. Pt states she had GI appointment last month but at that time her symptoms have improved. Pt states she is concerned something is blocking her rectum. Reports she is having bowel movements daily but they are now thin in size     Had vitamin d level redrawn about 2 days ago would like to review results       Past Medical History:   Diagnosis Date    AAA (abdominal aortic aneurysm) (CMS [facility])     Back ache     Bowel trouble     obstruction/nonsurgical    Chronic headaches     Depression     Fibromyalgia     Osteoarthritis     Splenic artery aneurysm (CMS HCC)          Past Surgical History:   Procedure Laterality Date    HX APPENDECTOMY      HX CESAREAN SECTION      2    HX CHOLECYSTECTOMY      HX COLONOSCOPY      HX LAP CHOLECYSTECTOMY  2012    HX SINUS SURGERY  2014    HX WISDOM TEETH EXTRACTION           Current Outpatient Medications   Medication Sig    ALPRAZolam (XANAX) 0.5 mg Oral Tablet Take 1 Tablet (0.5 mg total) by mouth Every 8 hours as needed for Insomnia or Anxiety    ascorbic acid, vitamin C, (VITAMIN C) 250 mg Oral Tablet Take 250 mg by mouth Once a day    citalopram (CELEXA) 40 mg Oral Tablet TAKE 1 TABLET DAILY    docusate sodium (STOOL SOFTENER) 100 mg Oral Capsule Take 100 mg by mouth Once a day      Herbal Drugs  (COLON HERBAL CLEANSER) Oral Capsule Take by mouth    HYDROcodone-acetaminophen (NORCO) 5-325 mg Oral Tablet Take 1 Tablet by mouth Every 6 hours as needed for Pain Only take twice a week    meloxicam (MOBIC) 15 mg Oral Tablet Take 1 Tab (15 mg total) by mouth Once a day    polyethylene glycol (MIRALAX) 17 gram/dose Oral Powder Take 3 teaspoons (17 g total) by mouth Once a day    vitamin E 100 unit Oral Capsule Take 100 Units by mouth Once a day     No Known Allergies  Family Medical History:     Problem Relation (Age of Onset)    Heart Attack Father (69), Sister (76)    Hypertension (High Blood Pressure) Mother (75)            Social History     Tobacco Use    Smoking status:  Never Smoker    Smokeless tobacco: Never Used   Vaping Use    Vaping Use: Never used   Substance Use Topics    Alcohol use: No    Drug use: No       Nursing Notes  There are no exam notes on file for this visit.     Review of Systems  Review of Systems   Constitutional: Positive for weight loss. Negative for chills, fever and malaise/fatigue.   HENT: Negative for congestion, ear discharge, ear pain, hearing loss, nosebleeds, sinus pain and sore throat.    Eyes: Negative for blurred vision, photophobia and discharge.   Respiratory: Negative for cough, hemoptysis, sputum production, shortness of breath and wheezing.    Cardiovascular: Negative for chest pain, palpitations and leg swelling.   Gastrointestinal: Positive for constipation. Negative for abdominal pain, blood in stool, diarrhea, heartburn, nausea and vomiting.   Genitourinary: Negative for dysuria, frequency, hematuria and urgency.   Musculoskeletal: Positive for joint pain. Negative for back pain, falls, myalgias and neck pain.   Skin: Negative for itching and rash.   Neurological: Negative for dizziness, tingling, tremors, weakness and headaches.   Endo/Heme/Allergies: Negative for environmental allergies. Does not bruise/bleed easily.   Psychiatric/Behavioral: Negative for  depression, memory loss, substance abuse and suicidal ideas. The patient is not nervous/anxious and does not have insomnia.        Physical Exam:  BP 116/64    Pulse (!) 101    Temp 37.1 C (98.7 F)    Resp 12    Ht 1.549 m (5\' 1" )    Wt 58.7 kg (129 lb 6.4 oz)    SpO2 97%    BMI 24.45 kg/m       Physical Exam  Vitals and nursing note reviewed.   Constitutional:       General: She is not in acute distress.     Appearance: She is not diaphoretic.   HENT:      Head: Normocephalic and atraumatic.   Eyes:      Conjunctiva/sclera: Conjunctivae normal.      Pupils: Pupils are equal, round, and reactive to light.   Cardiovascular:      Rate and Rhythm: Normal rate and regular rhythm.      Heart sounds: Normal heart sounds.   Pulmonary:      Effort: Pulmonary effort is normal.      Breath sounds: Normal breath sounds.   Abdominal:      General: Bowel sounds are normal. There is no distension.      Palpations: Abdomen is soft.      Tenderness: There is no abdominal tenderness.   Skin:     General: Skin is warm and dry.   Neurological:      Mental Status: She is alert and oriented to person, place, and time.      Coordination: Coordination normal.      Gait: Gait is intact.   Psychiatric:         Mood and Affect: Mood and affect normal.         Cognition and Memory: Memory normal.         Judgment: Judgment normal.         Assessment and Plan    ENCOUNTER DIAGNOSES     ICD-10-CM   1. Constipation, unspecified constipation type  K59.00        On the day of the encounter, a total of  30 minutes was spent on this  patient encounter including review of historical information, examination, documentation and post-visit activities.     Follow up: Return in about 4 weeks (around 11/21/2019), or if symptoms worsen or fail to improve.    Reviewed medications with her   Would like her to eat a normal diet  Start Miralax as directed  Drink plenty of water daily  Keep up coming appointment with GI, we would like he to have colonoscopy  completed     This patient was seen independently.    Read Drivers, PA-C  10/24/2019, 11:51

## 2019-10-24 NOTE — Telephone Encounter (Signed)
Pt wants you to know that she thinks she has a rectal blockage  She is hoping that you can do a rectal on her while she is here today for appointment   I told her i was not certain if that would happen here but you would certainly talk to her and advise her and set up any referral that would be needed for her problems  She just wanted me to give you a heads up before she got here  She didn't want you to be surprised

## 2019-11-04 IMAGING — CR DG CHEST 2V
2 series · 2 of 2 positions shown · non-contrast
Comparison: None.

CLINICAL DATA: Chest pain

EXAM:
CHEST - 2 VIEW

[chest pa]
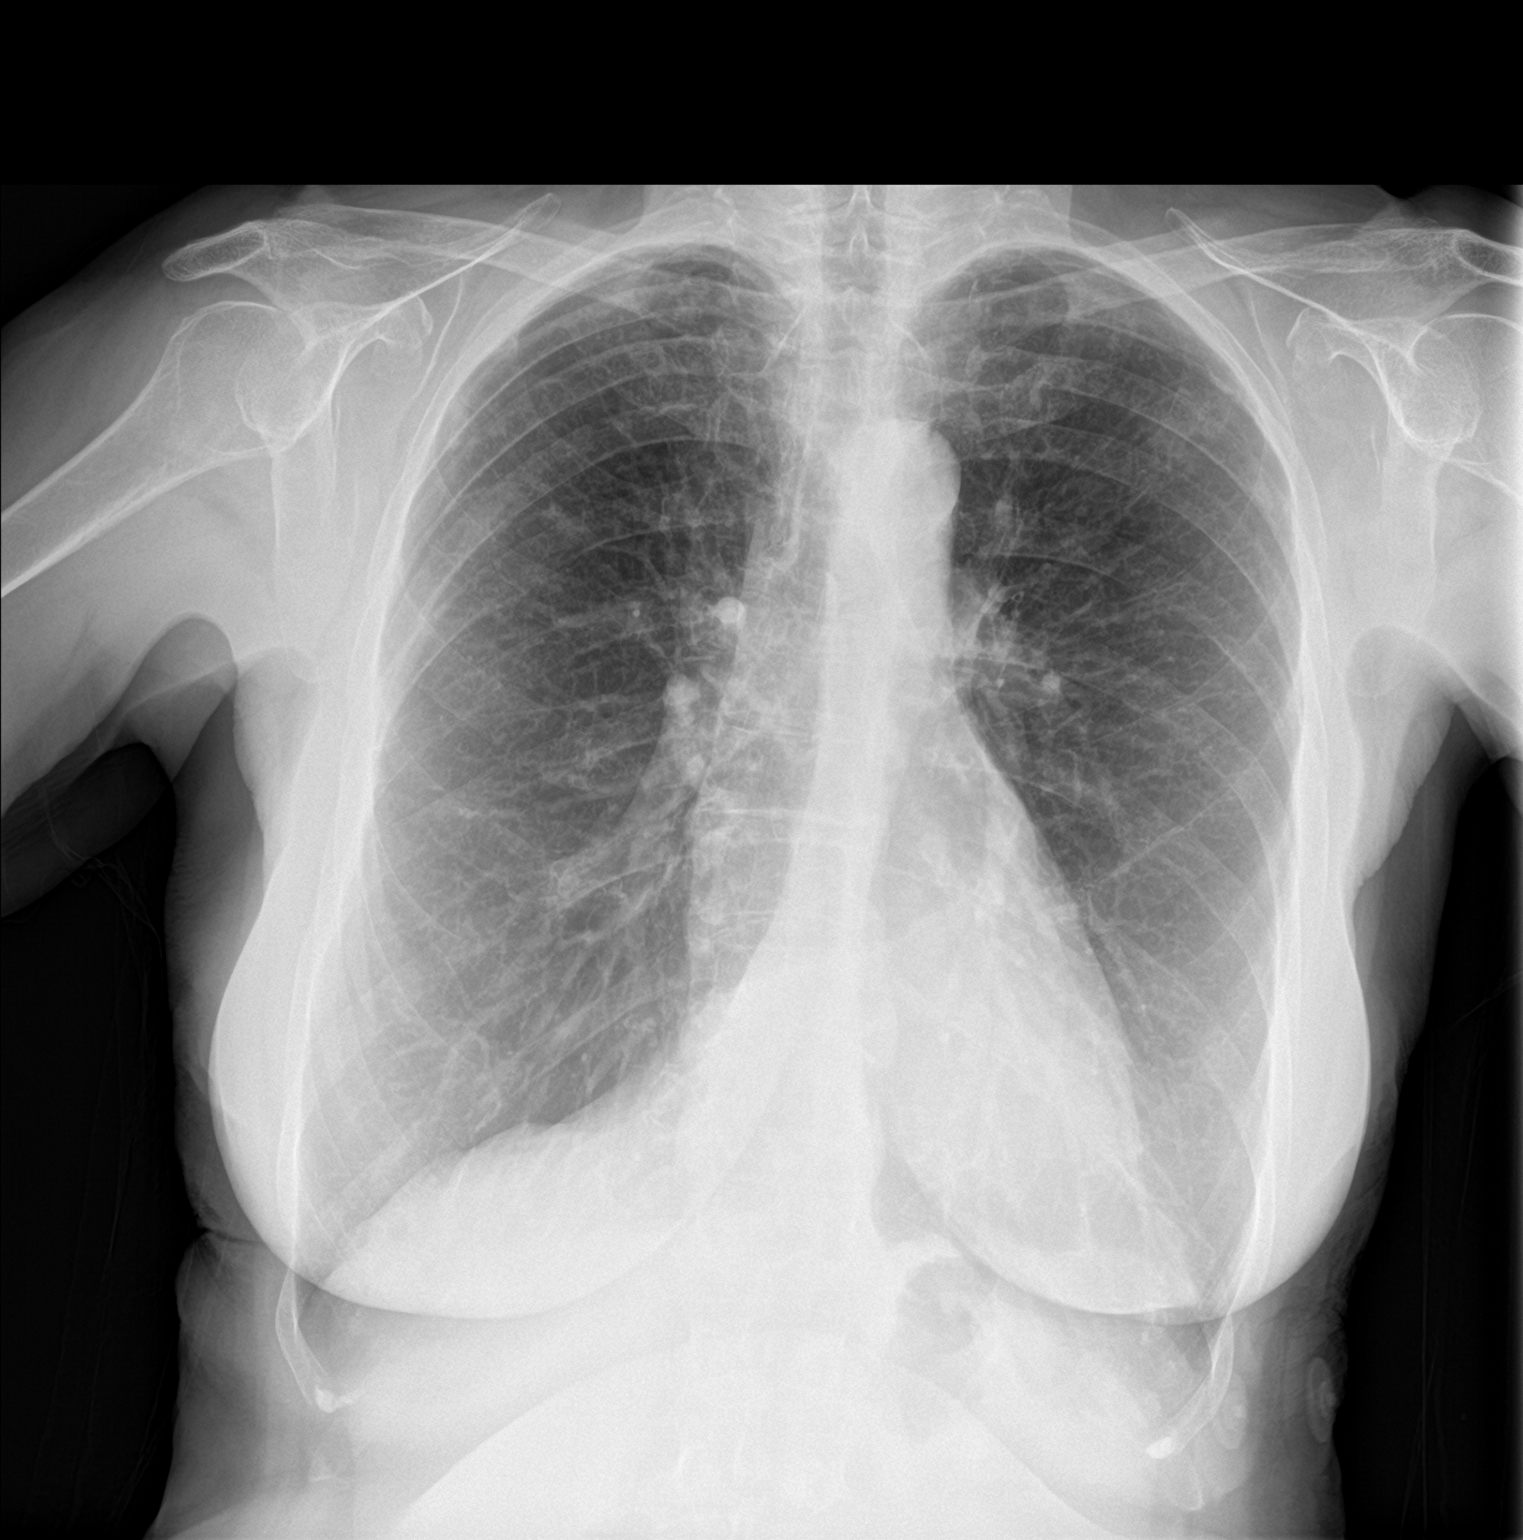

[chest lat]
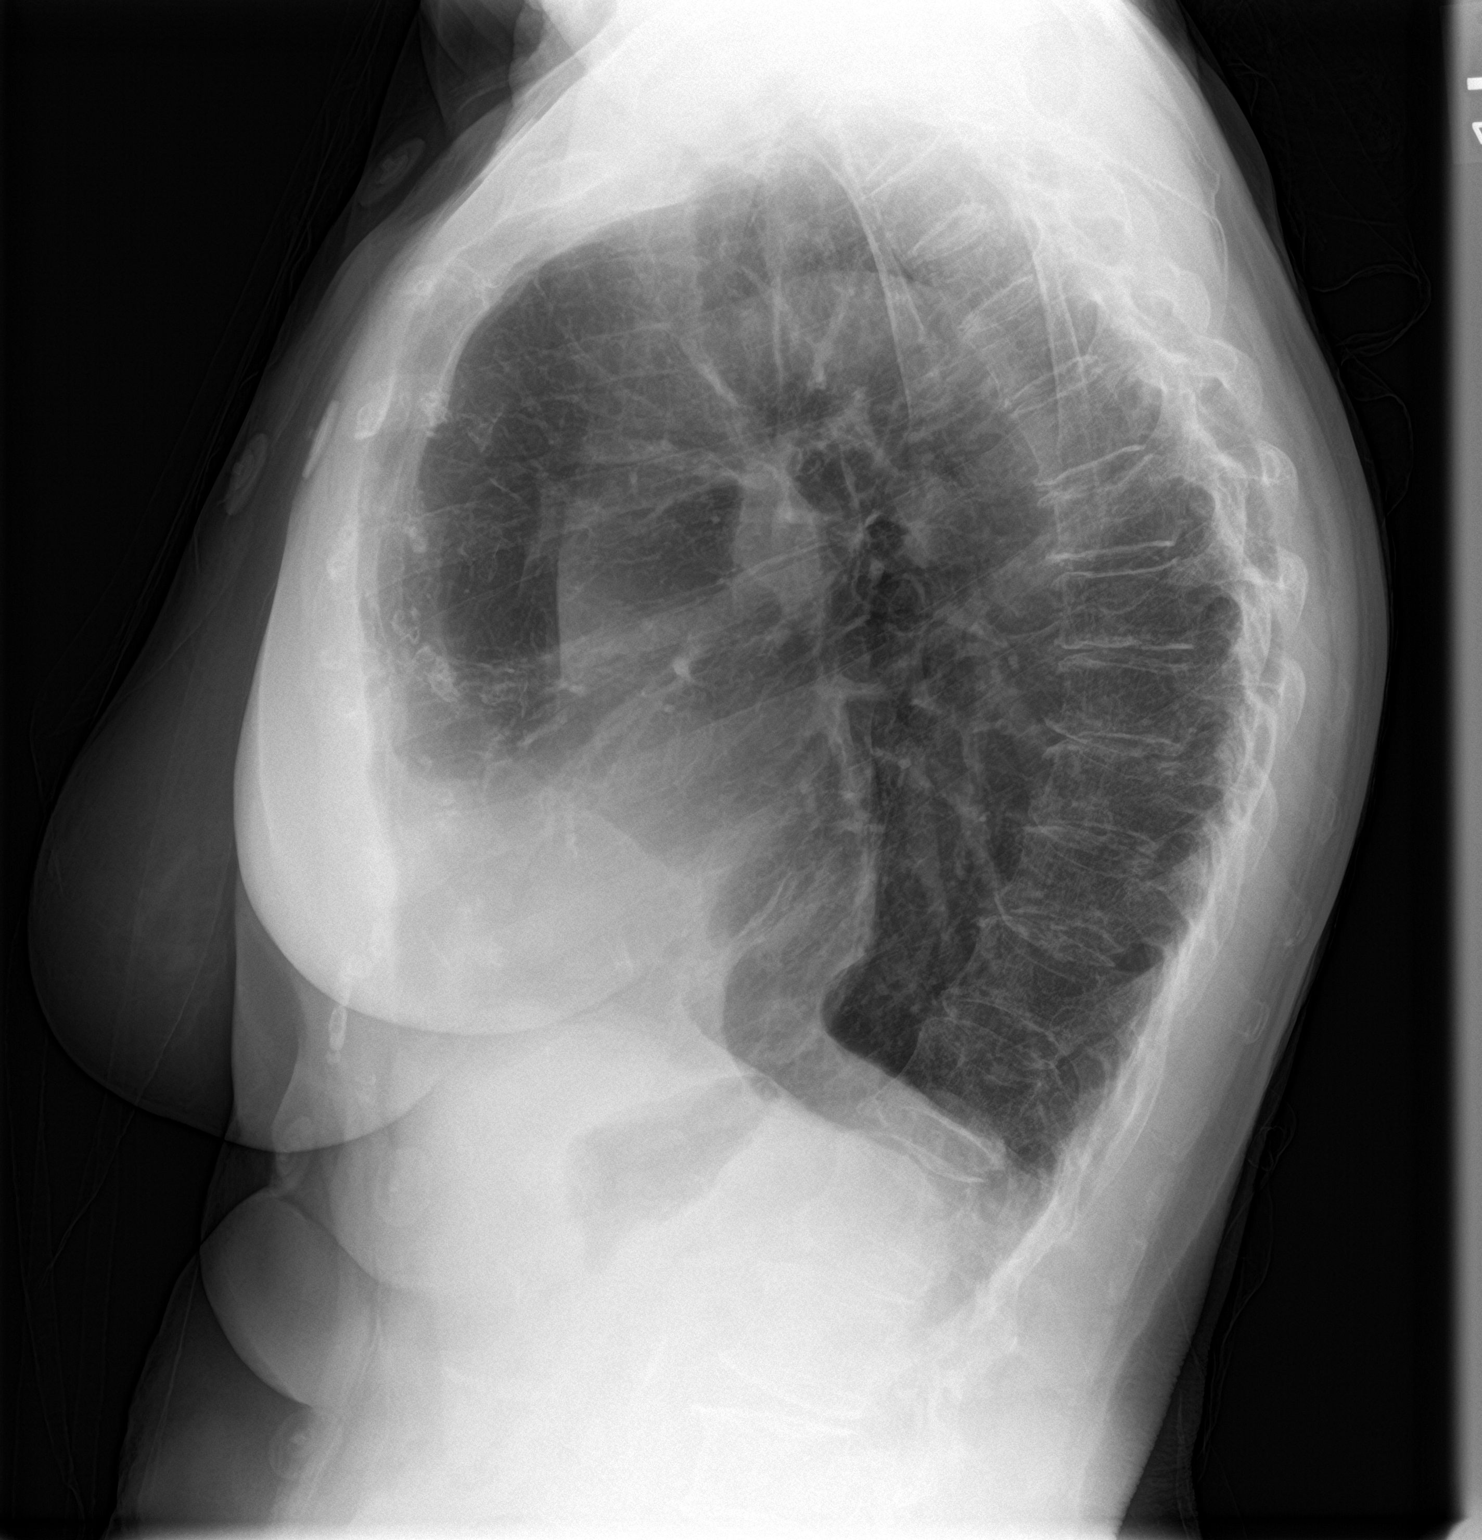

[2 of 2 positions shown; findings below may reference images not displayed]

FINDINGS: The heart size and mediastinal contours are within normal limits.
There is hyperinflation of the lung zones with flattening of the
hemidiaphragms. Mildly increased interstitial markings seen in both
lung apices with mild biapical scarring. No large airspace
consolidation. There is diffuse osteopenia and degenerative changes
in the midthoracic spine.
IMPRESSION: Mildly increased interstitial markings at both lung apices, likely
chronic changes. Findings of COPD

## 2019-11-12 ENCOUNTER — Other Ambulatory Visit (INDEPENDENT_AMBULATORY_CARE_PROVIDER_SITE_OTHER): Payer: Self-pay | Admitting: Physician Assistant

## 2019-12-01 IMAGING — CR DG CHEST 2V
2 series · 2 of 2 positions shown · non-contrast
Comparison: 10/19/2018

CLINICAL DATA: Wheezing

EXAM:
CHEST - 2 VIEW

[w chest lat]
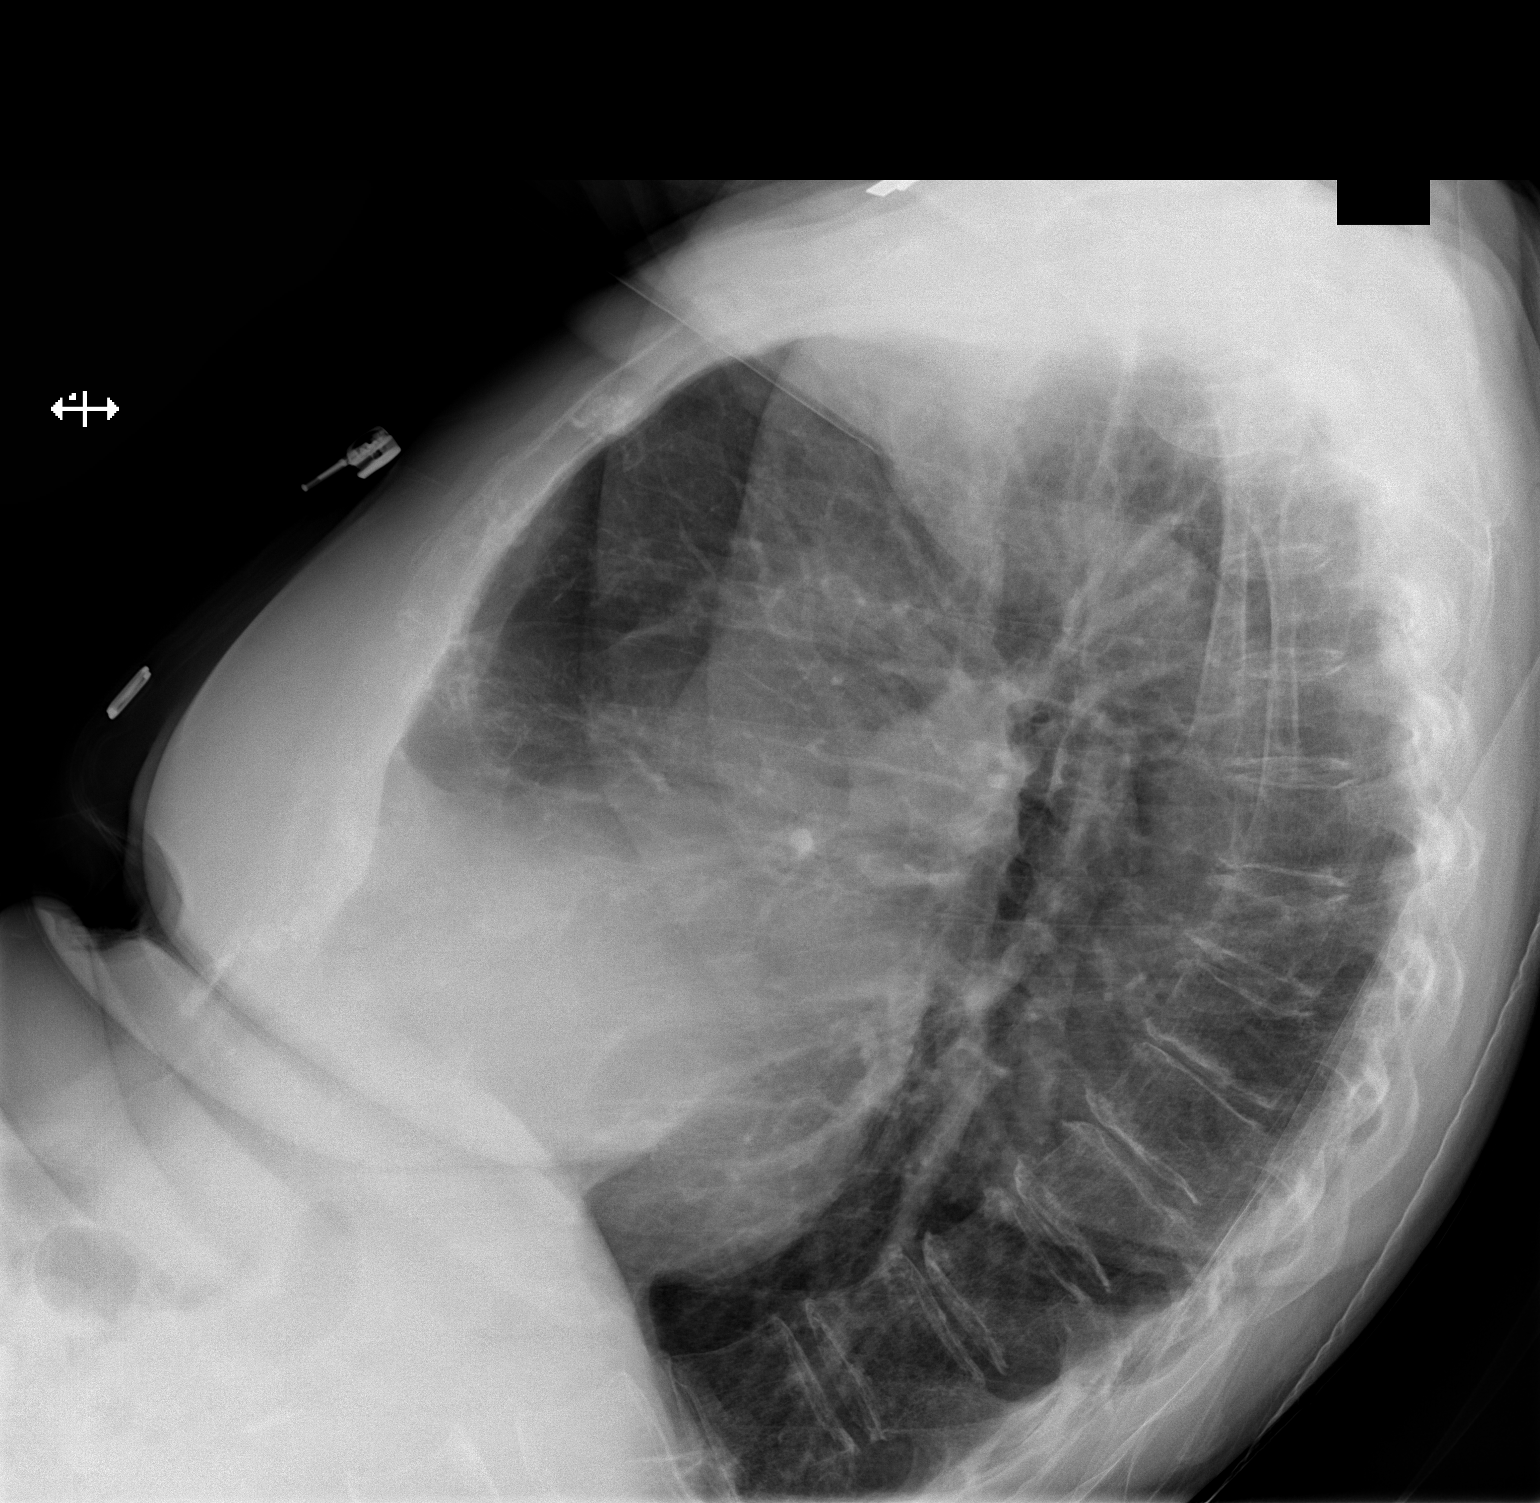

[x chest ap]
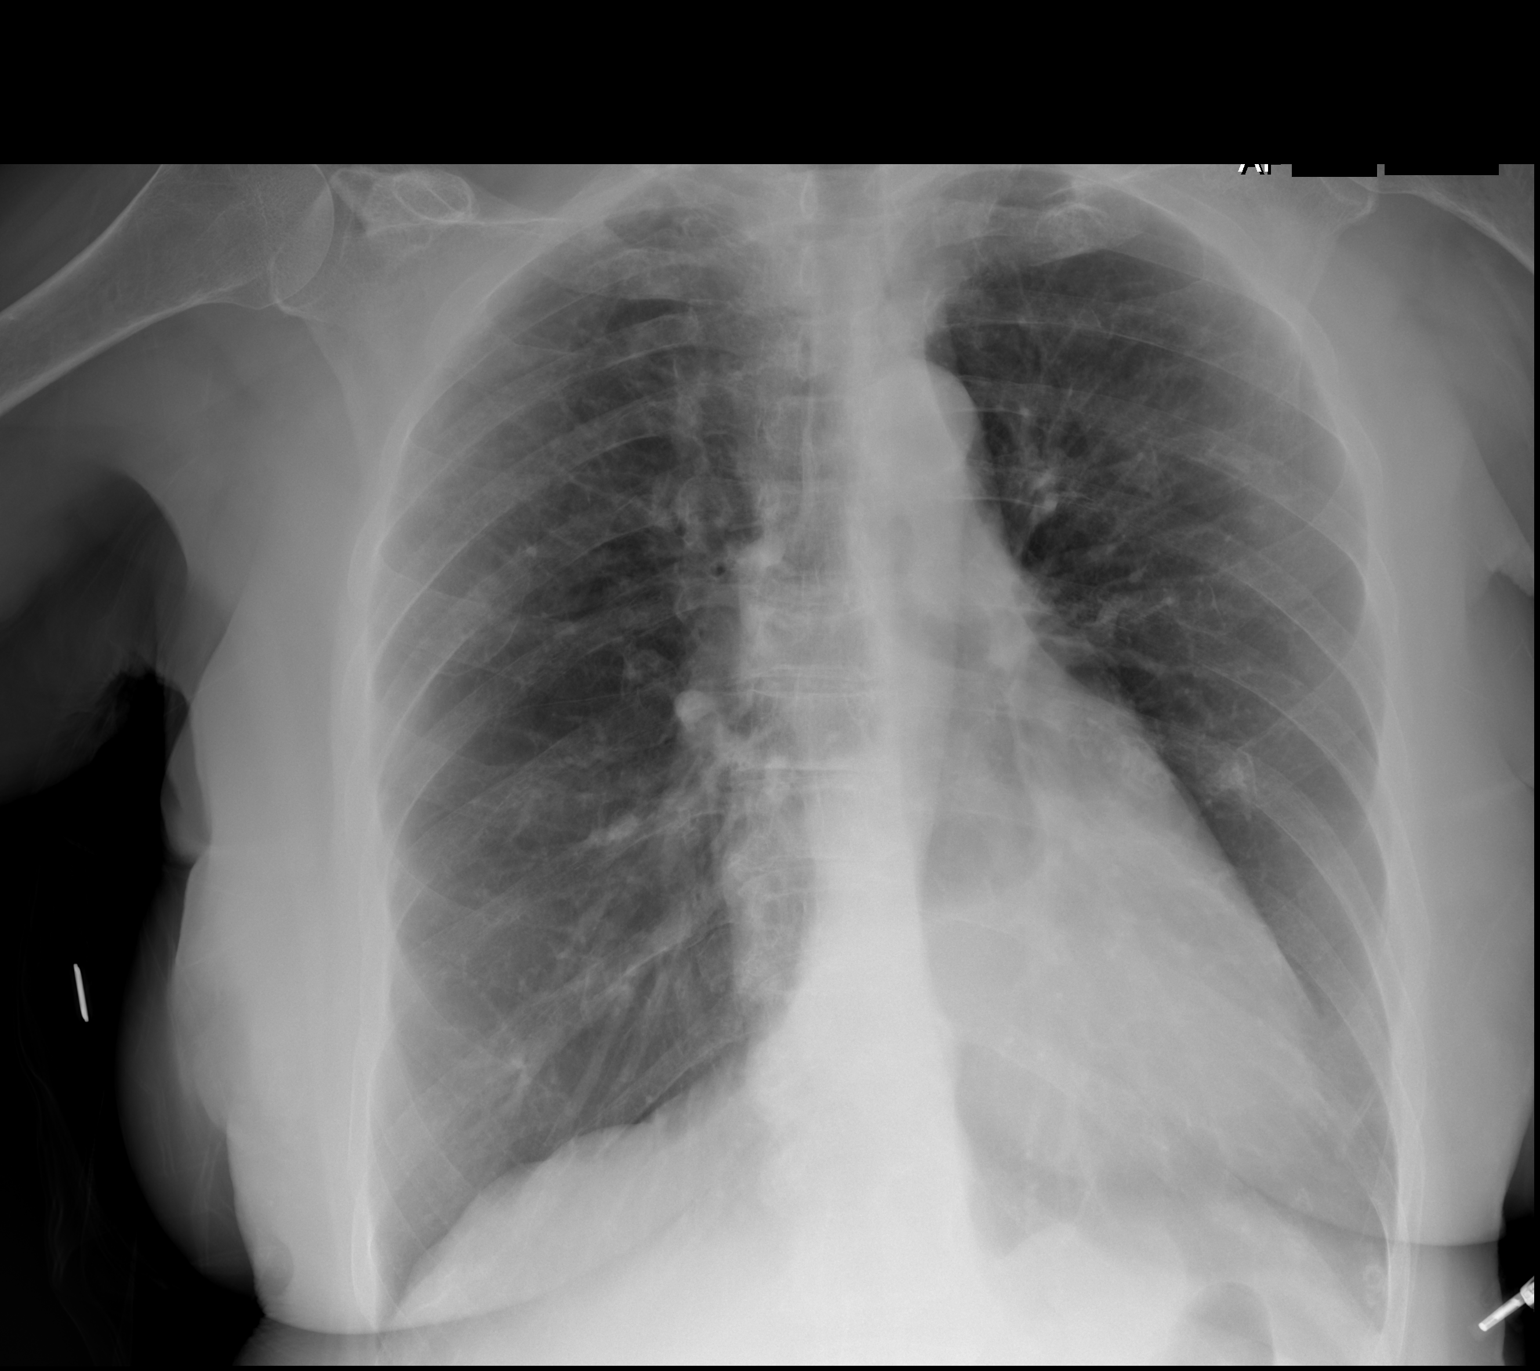

[2 of 2 positions shown; findings below may reference images not displayed]

FINDINGS: Hyperinflated lungs with bronchitic changes. No acute consolidation,
pleural effusion, or pneumothorax. Stable mild wedging of
midthoracic vertebra. Stable cardiomediastinal silhouette. No
pneumothorax.
IMPRESSION: Stable hyperinflation and bronchitic changes. No acute airspace
disease.

## 2019-12-31 ENCOUNTER — Ambulatory Visit (INDEPENDENT_AMBULATORY_CARE_PROVIDER_SITE_OTHER): Payer: HMO

## 2019-12-31 ENCOUNTER — Telehealth (INDEPENDENT_AMBULATORY_CARE_PROVIDER_SITE_OTHER): Payer: Self-pay | Admitting: Physician Assistant

## 2019-12-31 ENCOUNTER — Other Ambulatory Visit: Payer: Self-pay

## 2019-12-31 ENCOUNTER — Ambulatory Visit: Payer: HMO | Attending: Physician Assistant | Admitting: Physician Assistant

## 2019-12-31 DIAGNOSIS — R7989 Other specified abnormal findings of blood chemistry: Secondary | ICD-10-CM

## 2019-12-31 DIAGNOSIS — E162 Hypoglycemia, unspecified: Secondary | ICD-10-CM

## 2019-12-31 LAB — VITAMIN D 25 TOTAL: VITAMIN D 25, TOTAL: 116.6 ng/mL — ABNORMAL HIGH (ref 30.0–100.0)

## 2019-12-31 LAB — GLUCOSE FASTING: GLUCOSE FASTING: 84 mg/dL (ref 70–99)

## 2020-01-01 LAB — HGA1C (HEMOGLOBIN A1C WITH EST AVG GLUCOSE)
ESTIMATED AVERAGE GLUCOSE: 100 mg/dL (ref 0–153)
HEMOGLOBIN A1C: 5.1 % (ref <=7.0)

## 2020-01-02 ENCOUNTER — Encounter (INDEPENDENT_AMBULATORY_CARE_PROVIDER_SITE_OTHER): Payer: Self-pay | Admitting: Physician Assistant

## 2020-01-02 ENCOUNTER — Other Ambulatory Visit: Payer: Self-pay

## 2020-01-02 ENCOUNTER — Ambulatory Visit (INDEPENDENT_AMBULATORY_CARE_PROVIDER_SITE_OTHER): Payer: HMO | Admitting: Physician Assistant

## 2020-01-02 VITALS — BP 112/70 | HR 102 | Temp 98.4°F | Resp 12 | Ht 61.0 in | Wt 128.2 lb

## 2020-01-02 DIAGNOSIS — K5900 Constipation, unspecified: Secondary | ICD-10-CM

## 2020-01-02 DIAGNOSIS — R232 Flushing: Secondary | ICD-10-CM

## 2020-01-02 DIAGNOSIS — R5383 Other fatigue: Secondary | ICD-10-CM

## 2020-01-02 DIAGNOSIS — E162 Hypoglycemia, unspecified: Secondary | ICD-10-CM

## 2020-01-02 MED ORDER — LANCETS 26 GAUGE
11 refills | Status: AC
Start: 2020-01-02 — End: 2020-01-03

## 2020-01-02 MED ORDER — BLOOD GLUCOSE CONTROL, NORMAL SOLUTION
11 refills | Status: DC
Start: 2020-01-02 — End: 2020-01-22

## 2020-01-02 MED ORDER — BLOOD-GLUCOSE METER
0 refills | Status: DC
Start: 2020-01-02 — End: 2020-01-22

## 2020-01-02 MED ORDER — BLOOD SUGAR DIAGNOSTIC STRIPS
ORAL_STRIP | 0 refills | Status: DC
Start: 2020-01-02 — End: 2020-01-22

## 2020-01-02 NOTE — Progress Notes (Signed)
FAMILY MEDICINE, [facility]  [address]  [place] [address]       Name: Elizabeth Hunter  MRN:  [id number]       Date: 01/02/2020  DOB:      Age: 79 y.o.             Reason for Visit: Follow Up (lab results)    History of Present Illness  Elizabeth Hunter is a 79 y.o. female who is being seen today for possible hypoglycemia. Pt states she feels her blood glucose is dropping causing her symptoms of shakeness and anxiousness. States she had lab work completed 2 days ago due to these symptoms. Would like to review results of lab work.    Pt also states she is following with colorectal surgeon's office. Going thru therapy with Dr. Mila Palmer to help with rectal tone and to help with her bowel movements. Pt had recent colonoscopy by Dr. Paris Lore. Pt reports colonoscopy was normal with Dr. Paris Lore.   States they did start her on trulance which is expensive $140 monthly. States she did have a normal bowel movement yesterday. States she is working on her diet and not focusing on weight loss and just trying to eat normal again. Back in Feb when her symptoms had started she was decreasing her food intake and really cutting back on the amount she was eating due to wanting to have weight loss.     Past Medical History:   Diagnosis Date   . AAA (abdominal aortic aneurysm) (CMS HCC)    . Back ache    . Bowel trouble     obstruction/nonsurgical   . Chronic headaches    . Depression    . Fibromyalgia    . Osteoarthritis    . Splenic artery aneurysm (CMS [facility])          Past Surgical History:   Procedure Laterality Date   . HX APPENDECTOMY     . HX CESAREAN SECTION      2   . HX CHOLECYSTECTOMY     . HX COLONOSCOPY     . HX LAP CHOLECYSTECTOMY  2012   . HX SINUS SURGERY  2014   . HX WISDOM TEETH EXTRACTION           Current Outpatient Medications   Medication Sig   . ALPRAZolam (XANAX) 0.5 mg Oral Tablet Take 1 Tablet (0.5 mg total) by mouth Every 8 hours as needed for Insomnia or Anxiety   . ascorbic acid,  vitamin C, (VITAMIN C) 250 mg Oral Tablet Take 250 mg by mouth Once a day   . Blood Glucose Control, Normal Solution Use as directed with glucometer.   . Blood Sugar Diagnostic Strip Fingersticks once daily and as needed   . Blood-Glucose Meter Misc Glucometer of choice.  Use as directed   . citalopram (CELEXA) 40 mg Oral Tablet TAKE 1 TABLET DAILY   . docusate sodium (STOOL SOFTENER) 100 mg Oral Capsule Take 100 mg by mouth Once a day     . Herbal Drugs (COLON HERBAL CLEANSER) Oral Capsule Take by mouth   . HYDROcodone-acetaminophen (NORCO) 5-325 mg Oral Tablet Take 1 Tablet by mouth Every 6 hours as needed for Pain Only take twice a week   . lancets (UNIVERSAL 1 LANCETS) 26 gauge Misc Fingersticks once daily and as needed.   . meloxicam (MOBIC) 15 mg Oral Tablet TAKE 1 TABLET DAILY   . plecanatide (TRULANCE) 3 mg Oral Tablet  Take 3 mg by mouth   . polyethylene glycol (MIRALAX) 17 gram/dose Oral Powder Take 3 teaspoons (17 g total) by mouth Once a day   . vitamin E 100 unit Oral Capsule Take 100 Units by mouth Once a day     No Known Allergies  Family Medical History:     Problem Relation (Age of Onset)    Heart Attack Father (74), Sister (58)    Hypertension (High Blood Pressure) Mother (21)          Social History     Tobacco Use   . Smoking status: Never Smoker   . Smokeless tobacco: Never Used   Vaping Use   . Vaping Use: Never used   Substance Use Topics   . Alcohol use: No   . Drug use: No       Nursing Notes  There are no exam notes on file for this visit.     Review of Systems  Review of Systems   Constitutional: Negative for chills, fever, malaise/fatigue and weight loss.   HENT: Negative for congestion, ear discharge, ear pain, hearing loss, nosebleeds, sinus pain and sore throat.    Eyes: Negative for blurred vision, photophobia and discharge.   Respiratory: Negative for cough, hemoptysis, sputum production, shortness of breath and wheezing.    Cardiovascular: Negative for chest pain, palpitations and  leg swelling.   Gastrointestinal: Negative for abdominal pain, blood in stool, constipation, diarrhea, heartburn, nausea and vomiting.   Genitourinary: Negative for dysuria, frequency, hematuria and urgency.   Musculoskeletal: Negative for back pain, falls, joint pain, myalgias and neck pain.   Skin: Negative for itching and rash.   Neurological: Negative for dizziness, tingling, tremors, weakness and headaches.   Endo/Heme/Allergies: Negative for environmental allergies. Does not bruise/bleed easily.   Psychiatric/Behavioral: Negative for depression, memory loss, substance abuse and suicidal ideas. The patient is nervous/anxious. The patient does not have insomnia.        Physical Exam:  BP 112/70   Pulse (!) 102   Temp 36.9 C (98.4 F)   Resp 12   Ht 1.549 m (5\' 1" )   Wt 58.2 kg (128 lb 3.2 oz)   SpO2 98%   BMI 24.22 kg/m       Physical Exam  Vitals and nursing note reviewed.   Constitutional:       General: She is not in acute distress.     Appearance: She is not diaphoretic.   HENT:      Head: Normocephalic and atraumatic.   Eyes:      Conjunctiva/sclera: Conjunctivae normal.      Pupils: Pupils are equal, round, and reactive to light.   Cardiovascular:      Rate and Rhythm: Normal rate and regular rhythm.      Heart sounds: Normal heart sounds.   Pulmonary:      Effort: Pulmonary effort is normal.      Breath sounds: Normal breath sounds.   Musculoskeletal:      Lumbar back: Decreased range of motion.   Skin:     General: Skin is warm and dry.   Neurological:      Mental Status: She is alert and oriented to person, place, and time.      Coordination: Coordination normal.      Gait: Gait is intact.   Psychiatric:         Mood and Affect: Mood and affect normal.         Cognition and  Memory: Memory normal.         Judgment: Judgment normal.         Assessment and Plan    ENCOUNTER DIAGNOSES     ICD-10-CM   1. Hypoglycemia  E16.2   2. Hot flashes  R23.2   3. Fatigue, unspecified type  R53.83   4.  Constipation, unspecified constipation type  K59.00      On the day of the encounter, a total of  35 minutes was spent on this patient encounter including review of historical information, examination, documentation and post-visit activities.     Reviewed lab work     Hemoglobin A1C  Order: [id number]  Status:  Final result Visible to patient:  Yes (not seen) Next appt:  03/11/2020 at 01:30 PM in Cardiology ([facility]) Dx:  Hypoglycemia   0 Result Notes  Component   Ref Range & Units 2 d ago 7 mo ago   HEMOGLOBIN A1C   <=7.0 % 5.1  5.1 CM    Comment: Hemoglobin A1C Interpretive Comment   Less than 7% - Therapy Goal   7 - 8% - Good Control   Greater than 8% - Re-evaluate Treatment   ESTIMATED AVERAGE GLUCOSE   0 - 153 mg/dL 909  311    Resulting Agency [facility]           Glucose Fasting  Order: [id number]  Status:  Final result Visible to patient:  Yes (seen) Next appt:  03/11/2020 at 01:30 PM in Cardiology ([facility]) Dx:  Hypoglycemia   0 Result Notes  Component   Ref Range & Units 2 d ago   GLUCOSE FASTING   70 - 99 mg/dL 84    Resulting Agency [facility]         Specimen Collected: 12/31/19 12:05 Last Resulted: 12/31/19 22:10               follow up: Return if symptoms worsen or fail to improve, for Follow up after testing.    Continue with current medications  Keep f/u with colorectal surgeon  Stay active  Continue to eat a balanced diet  And drink water, 64 oz daily, goal    Orders Placed This Encounter   . CBC/DIFF   . Comp Metabolic Panel-Non Fasting   . TSH w/ Free T4 Reflex   . Blood-Glucose Meter Misc   . Blood Sugar Diagnostic Strip   . lancets (UNIVERSAL 1 LANCETS) 26 gauge Misc   . Blood Glucose Control, Normal Solution       This patient was seen independently.    Read Drivers, PA-C  01/02/2020, 11:15

## 2020-01-03 ENCOUNTER — Ambulatory Visit: Payer: HMO | Attending: Physician Assistant | Admitting: Physician Assistant

## 2020-01-03 ENCOUNTER — Ambulatory Visit (INDEPENDENT_AMBULATORY_CARE_PROVIDER_SITE_OTHER): Payer: HMO

## 2020-01-03 ENCOUNTER — Other Ambulatory Visit (INDEPENDENT_AMBULATORY_CARE_PROVIDER_SITE_OTHER): Payer: Self-pay | Admitting: Family Medicine

## 2020-01-03 DIAGNOSIS — R232 Flushing: Secondary | ICD-10-CM

## 2020-01-03 DIAGNOSIS — E162 Hypoglycemia, unspecified: Secondary | ICD-10-CM | POA: Insufficient documentation

## 2020-01-03 DIAGNOSIS — R5383 Other fatigue: Secondary | ICD-10-CM

## 2020-01-03 LAB — COMPREHENSIVE METABOLIC PANEL, NON-FASTING
ALBUMIN: 4.1 g/dL (ref 3.4–4.8)
ALKALINE PHOSPHATASE: 49 U/L — ABNORMAL LOW (ref 55–145)
ALT (SGPT): 21 U/L (ref 8–22)
ANION GAP: 6 mmol/L (ref 4–13)
AST (SGOT): 25 U/L (ref 8–45)
BILIRUBIN TOTAL: 0.6 mg/dL (ref 0.3–1.3)
BUN/CREA RATIO: 19 (ref 6–22)
BUN: 15 mg/dL (ref 8–25)
CALCIUM: 9.9 mg/dL (ref 8.8–10.2)
CHLORIDE: 106 mmol/L (ref 96–111)
CO2 TOTAL: 27 mmol/L (ref 23–31)
CREATININE: 0.77 mg/dL (ref 0.60–1.05)
ESTIMATED GFR: 74 mL/min/BSA (ref >=60)
GLUCOSE: 82 mg/dL (ref 65–125)
POTASSIUM: 4 mmol/L (ref 3.5–5.1)
PROTEIN TOTAL: 7.1 g/dL (ref 6.0–8.0)
SODIUM: 139 mmol/L (ref 136–145)

## 2020-01-03 LAB — CBC WITH DIFF
BASOPHIL #: 0.1 10*3/uL (ref 0.00–0.30)
BASOPHIL %: 1 % (ref 0–1)
EOSINOPHIL #: 0.1 10*3/uL (ref 0.00–0.50)
EOSINOPHIL %: 2 % (ref 0–4)
HCT: 39 % (ref 37.0–47.0)
HGB: 12.8 g/dL (ref 12.5–16.0)
LYMPHOCYTE #: 1.1 10*3/uL (ref 0.90–4.80)
LYMPHOCYTE %: 22 % — ABNORMAL LOW (ref 23–35)
MCH: 31.6 pg (ref 28.0–33.0)
MCHC: 32.7 g/dL (ref 32.0–37.0)
MCV: 96.6 fL (ref 78.0–100.0)
MONOCYTE #: 0.4 10*3/uL (ref 0.30–0.90)
MONOCYTE %: 8 % (ref 0–12)
NEUTROPHIL #: 3.3 10*3/uL (ref 1.70–7.00)
NEUTROPHIL %: 67 % (ref 50–70)
PLATELETS: 214 10*3/uL (ref 130–400)
RBC: 4.03 10*6/uL — ABNORMAL LOW (ref 4.20–5.40)
RDW: 13.1 % (ref 9.9–16.5)
WBC: 5 10*3/uL (ref 4.5–11.0)

## 2020-01-03 LAB — THYROID STIMULATING HORMONE WITH FREE T4 REFLEX: TSH: 2.016 u[IU]/mL (ref 0.430–3.550)

## 2020-01-09 ENCOUNTER — Telehealth (INDEPENDENT_AMBULATORY_CARE_PROVIDER_SITE_OTHER): Payer: HMO | Admitting: Physician Assistant

## 2020-01-09 ENCOUNTER — Encounter (INDEPENDENT_AMBULATORY_CARE_PROVIDER_SITE_OTHER): Payer: Self-pay | Admitting: Physician Assistant

## 2020-01-09 ENCOUNTER — Other Ambulatory Visit: Payer: Self-pay

## 2020-01-09 VITALS — Ht 61.0 in | Wt 128.0 lb

## 2020-01-09 DIAGNOSIS — U071 COVID-19: Secondary | ICD-10-CM

## 2020-01-09 NOTE — Progress Notes (Signed)
FAMILY MEDICINE, [facility]  [address] [place] [address]    Telephone Visit    Name:  Elizabeth Hunter MRN: [id number]   Date:  01/09/2020 Age:   79 y.o.     The patient/family initiated a request for telephone service.  Verbal consent for this service was obtained from the patient/family.    Last office visit in this department: 01/02/2020      Reason for call: + COVID-19, congestion   Call notes: pt denies known fever, pt states her husband is admitted right now at [facility] with Covid-19. States her grandson came up from [place] to help take care of her at home. She denies fever and denies cough at this time. States she is fatigued. Has been vaccinated. Has not had the booster vaccine at this time.       ICD-10-CM    1. COVID-19  U07.1      Continue with current medications  Continue with nyquil and dayquil OTC as needed  monitor pulse oxygen at home, if the level drops below 90% she needs to go to ER  If her symptoms worsen she needs to go to ER  Drink plenty of fluids  Continue with vitamins C, zinc  Rest  Tylenol as needed for aches/fever    Total provider time spent with the patient on the phone: 19 minutes.    Read Drivers, PA-C

## 2020-01-22 ENCOUNTER — Telehealth (INDEPENDENT_AMBULATORY_CARE_PROVIDER_SITE_OTHER): Payer: HMO | Admitting: Physician Assistant

## 2020-01-22 ENCOUNTER — Encounter (INDEPENDENT_AMBULATORY_CARE_PROVIDER_SITE_OTHER): Payer: Self-pay | Admitting: Physician Assistant

## 2020-01-22 DIAGNOSIS — K5900 Constipation, unspecified: Secondary | ICD-10-CM

## 2020-01-22 DIAGNOSIS — F411 Generalized anxiety disorder: Secondary | ICD-10-CM

## 2020-01-22 DIAGNOSIS — F410 Panic disorder [episodic paroxysmal anxiety] without agoraphobia: Secondary | ICD-10-CM

## 2020-01-22 NOTE — Progress Notes (Signed)
FAMILY MEDICINE, [facility]  [address] [place] [address]    Telephone Visit    Name:  Elizabeth Hunter MRN: [id number]   Date:  01/22/2020 Age:   79 y.o.     The patient/family initiated a request for telephone service.  Verbal consent for this service was obtained from the patient/family.    Last office visit in this department: 01/02/2020      Reason for call: continued constipation. States he has seen Dr. Oda Kilts office last week. Following their recommendation of taking lactulose. States she did have a bowel movement this morning. Still very concerned to why she is constipated. Concerned about the area in her rectum that they said is like a "dip" which she states she can physical feel herself. States she has followed with Dr. Mila Palmer also. Pt has had CT scan of abdomen and pelvis. Pt has had colonoscopy. She will see Dr. Paris Lore himself on 02/26/20. Cancelled last weeks appointment due to having covid-19. States her and her husband are now both doing well. States he grandson from [place] has now left and returned home. He came to help them while they had covid.    Call notes:  Continue with current medications. She is only taking the xanax .5mg  at bedtime. We will have her try taking 1/2 tablet in the morning to help with her anxiety symptoms. She may continue to take the xanax .5mg  at bedtime as needed.     Pt to continue under the recommendation and treatment plan of Dr. office.   Pt will continue with the lactulose as directed  Keep up coming appointment    Continue to drink plenty of fluids daily, water  Continue to eat diet high in fiber        ICD-10-CM    1. Constipation, unspecified constipation type  K59.00    2. Generalized anxiety disorder with panic attacks  F41.1     F41.0        Total provider time spent with the patient on the phone: 28 minutes.    , PA-C

## 2020-01-30 ENCOUNTER — Other Ambulatory Visit (INDEPENDENT_AMBULATORY_CARE_PROVIDER_SITE_OTHER): Payer: Self-pay | Admitting: Physician Assistant

## 2020-02-05 ENCOUNTER — Telehealth (INDEPENDENT_AMBULATORY_CARE_PROVIDER_SITE_OTHER): Payer: Self-pay | Admitting: Family Medicine

## 2020-02-05 NOTE — Telephone Encounter (Signed)
Pt said she has heard Cymbalta is a possible med that could help her.  She is wondering your thoughts on this

## 2020-02-13 ENCOUNTER — Encounter (INDEPENDENT_AMBULATORY_CARE_PROVIDER_SITE_OTHER): Payer: Self-pay | Admitting: Physician Assistant

## 2020-02-13 ENCOUNTER — Other Ambulatory Visit: Payer: Self-pay

## 2020-02-13 ENCOUNTER — Ambulatory Visit (INDEPENDENT_AMBULATORY_CARE_PROVIDER_SITE_OTHER): Payer: HMO | Admitting: Physician Assistant

## 2020-02-13 VITALS — BP 118/80 | HR 92 | Temp 98.3°F | Resp 12 | Ht 61.0 in | Wt 128.4 lb

## 2020-02-13 DIAGNOSIS — F410 Panic disorder [episodic paroxysmal anxiety] without agoraphobia: Secondary | ICD-10-CM

## 2020-02-13 DIAGNOSIS — F411 Generalized anxiety disorder: Secondary | ICD-10-CM

## 2020-02-13 MED ORDER — DULOXETINE 30 MG CAPSULE,DELAYED RELEASE
30.0000 mg | DELAYED_RELEASE_CAPSULE | Freq: Every day | ORAL | 3 refills | Status: DC
Start: 2020-02-13 — End: 2020-02-20

## 2020-02-13 NOTE — Progress Notes (Signed)
FAMILY MEDICINE, [facility]  [address]  [place] [address]       Name: Elizabeth Hunter  MRN:  [id number]       Date: 02/13/2020  DOB:      Age: 80 y.o.             Reason for Visit: Follow Up (anxiety)    History of Present Illness  Elizabeth Hunter is a 80 y.o. female who is being seen today for anxiety. Pt states she does not feel the Celexa is helping with her anxiety anymore and would like to change the medication.     Past Medical History:   Diagnosis Date   . AAA (abdominal aortic aneurysm) (CMS HCC)    . Back ache    . Bowel trouble     obstruction/nonsurgical   . Chronic headaches    . Depression    . Fibromyalgia    . Osteoarthritis    . Splenic artery aneurysm (CMS [facility])          Past Surgical History:   Procedure Laterality Date   . HX APPENDECTOMY     . HX CESAREAN SECTION      2   . HX CHOLECYSTECTOMY     . HX COLONOSCOPY     . HX LAP CHOLECYSTECTOMY  2012   . HX SINUS SURGERY  2014   . HX WISDOM TEETH EXTRACTION           Current Outpatient Medications   Medication Sig   . ALPRAZolam (XANAX) 0.5 mg Oral Tablet Take 1 Tablet (0.5 mg total) by mouth Every 8 hours as needed for Insomnia or Anxiety   . ascorbic acid (VITAMIN C) 1,000 mg Oral Tablet Take 1,000 mg by mouth Once a day   . ascorbic acid, vitamin C, (VITAMIN C) 250 mg Oral Tablet Take 250 mg by mouth Once a day   . citalopram (CELEXA) 40 mg Oral Tablet TAKE 1 TABLET DAILY   . docusate sodium (STOOL SOFTENER) 100 mg Oral Capsule Take 100 mg by mouth Once a day     . DULoxetine (CYMBALTA DR) 30 mg Oral Capsule, Delayed Release(E.C.) Take 1 Capsule (30 mg total) by mouth Once a day   . Herbal Drugs (COLON HERBAL CLEANSER) Oral Capsule Take by mouth   . Lactobac no.41/Bifidobact no.7 (PROBIOTIC-10 ORAL) Take by mouth   . meloxicam (MOBIC) 15 mg Oral Tablet TAKE 1 TABLET DAILY   . plecanatide (TRULANCE) 3 mg Oral Tablet Take 3 mg by mouth Not taking it reguarly    . vitamin E 100 unit Oral Capsule Take 100 Units by  mouth Once a day   . vitamin E 400 unit Oral Capsule Take 400 Units by mouth Once a day   . zinc 50 mg Oral Tablet Take 50 mg by mouth Once a day     No Known Allergies  Family Medical History:     Problem Relation (Age of Onset)    Heart Attack Father (68)    Hypertension (High Blood Pressure) Mother (12)          Social History     Tobacco Use   . Smoking status: Never Smoker   . Smokeless tobacco: Never Used   Vaping Use   . Vaping Use: Never used   Substance Use Topics   . Alcohol use: No   . Drug use: No       Nursing Notes  There are  no exam notes on file for this visit.     Review of Systems  Review of Systems   Constitutional: Negative for chills, fever, malaise/fatigue and weight loss.   HENT: Negative for congestion, ear discharge, ear pain, hearing loss, nosebleeds, sinus pain and sore throat.    Eyes: Negative for blurred vision, photophobia and discharge.   Respiratory: Negative for cough, hemoptysis, sputum production, shortness of breath and wheezing.    Cardiovascular: Negative for chest pain, palpitations and leg swelling.   Gastrointestinal: Positive for constipation. Negative for abdominal pain, blood in stool, diarrhea, heartburn, nausea and vomiting.        Continues to follow with colorectal surgeons office  Going through therapy with Dr. Mila Palmer    Genitourinary: Negative for dysuria, frequency, hematuria and urgency.   Musculoskeletal: Negative for back pain, falls, joint pain, myalgias and neck pain.   Skin: Negative for itching and rash.   Neurological: Negative for dizziness, tingling, tremors, weakness and headaches.   Endo/Heme/Allergies: Negative for environmental allergies. Does not bruise/bleed easily.   Psychiatric/Behavioral: Positive for depression. Negative for memory loss, substance abuse and suicidal ideas. The patient is nervous/anxious. The patient does not have insomnia.         Pt states she is feeling well with depression. States she feels motivated to do things and wants to  do things. States she wakes up feeling anxious. And then has panic attacks  Pt states she feels she would like to have her medication changed from Celexa to Cymbalta.  States she has talked to many friends and they really like how Cymbalta has helped them.        Physical Exam:  BP 118/80   Pulse 92   Temp 36.8 C (98.3 F)   Resp 12   Ht 1.549 m (5\' 1" )   Wt 58.2 kg (128 lb 6.4 oz)   SpO2 98%   BMI 24.26 kg/m       Physical Exam  Vitals and nursing note reviewed.   Constitutional:       General: She is not in acute distress.     Appearance: She is not diaphoretic.   HENT:      Head: Normocephalic and atraumatic.   Eyes:      Conjunctiva/sclera: Conjunctivae normal.      Pupils: Pupils are equal, round, and reactive to light.   Cardiovascular:      Rate and Rhythm: Normal rate and regular rhythm.      Heart sounds: Normal heart sounds.   Pulmonary:      Effort: Pulmonary effort is normal.      Breath sounds: Normal breath sounds.   Abdominal:      General: Bowel sounds are normal.      Palpations: Abdomen is soft.   Musculoskeletal:         General: Normal range of motion.   Skin:     General: Skin is warm and dry.   Neurological:      Mental Status: She is alert and oriented to person, place, and time.      Coordination: Coordination normal.      Gait: Gait is intact.   Psychiatric:         Mood and Affect: Mood and affect normal.         Cognition and Memory: Memory normal.         Judgment: Judgment normal.         Assessment and Plan    ENCOUNTER  DIAGNOSES     ICD-10-CM   1. Generalized anxiety disorder with panic attacks  F41.1    F41.0      On the day of the encounter, a total of  30 minutes was spent on this patient encounter including review of historical information, examination, documentation and post-visit activities.     We will have the patient taper Celexa from the 40mg  daily to 20mg  daily for the next week and then stop the celexa  She will start the Cymbalta 30mg  daily tomorrow  Continue with  xanax as needed  We will refer to therapist/psychologist   F/u in the office in 2 weeks    Orders Placed This Encounter   . OUTSIDE CONSULT/REFERRAL PROVIDER(AMB)   . DULoxetine (CYMBALTA DR) 30 mg Oral Capsule, Delayed Release(E.C.)     Follow up: Return in about 2 weeks (around 02/27/2020), or if symptoms worsen or fail to improve.      This patient was seen independently.    , PA-C  02/13/2020, 12:09

## 2020-02-14 ENCOUNTER — Telehealth (INDEPENDENT_AMBULATORY_CARE_PROVIDER_SITE_OTHER): Payer: Self-pay | Admitting: Physician Assistant

## 2020-02-14 ENCOUNTER — Other Ambulatory Visit (INDEPENDENT_AMBULATORY_CARE_PROVIDER_SITE_OTHER): Payer: Self-pay | Admitting: Family Medicine

## 2020-02-14 NOTE — Telephone Encounter (Signed)
02/14/20 pc spoke with Cortina advise  [facility] requested for her to go to there webiste and fill out paper work. she advise she can not do that .she requested to see counselor gave  jamie davis and Domenic Schwab  phone #

## 2020-02-14 NOTE — Telephone Encounter (Signed)
PT WANTS TO KNOW IF She SHOULD STAY ON THE [facility] OR NOT

## 2020-02-19 MED ORDER — MELOXICAM 15 MG TABLET
15.0000 mg | ORAL_TABLET | Freq: Every day | ORAL | 3 refills | Status: DC
Start: 2020-02-19 — End: 2020-07-10

## 2020-02-19 NOTE — Telephone Encounter (Signed)
YES

## 2020-02-20 ENCOUNTER — Other Ambulatory Visit: Payer: Self-pay

## 2020-02-20 ENCOUNTER — Ambulatory Visit (INDEPENDENT_AMBULATORY_CARE_PROVIDER_SITE_OTHER): Payer: HMO | Admitting: Physician Assistant

## 2020-02-20 ENCOUNTER — Encounter (INDEPENDENT_AMBULATORY_CARE_PROVIDER_SITE_OTHER): Payer: Self-pay | Admitting: Physician Assistant

## 2020-02-20 VITALS — BP 124/84 | HR 78 | Temp 97.5°F | Resp 12 | Ht 61.0 in | Wt 126.0 lb

## 2020-02-20 DIAGNOSIS — F411 Generalized anxiety disorder: Secondary | ICD-10-CM

## 2020-02-20 DIAGNOSIS — F320 Major depressive disorder, single episode, mild: Secondary | ICD-10-CM

## 2020-02-20 DIAGNOSIS — F410 Panic disorder [episodic paroxysmal anxiety] without agoraphobia: Secondary | ICD-10-CM

## 2020-02-20 MED ORDER — DULOXETINE 60 MG CAPSULE,DELAYED RELEASE
60.0000 mg | DELAYED_RELEASE_CAPSULE | Freq: Every day | ORAL | 1 refills | Status: DC
Start: 2020-02-20 — End: 2020-03-12

## 2020-02-20 NOTE — Progress Notes (Signed)
FAMILY MEDICINE, [facility]  [address]  [place] [address]       Name: Elizabeth Hunter  MRN:  [id number]       Date: 02/20/2020  DOB:      Age: 80 y.o.             Reason for Visit: Anxiety    History of Present Illness  Elizabeth Hunter is a 80 y.o. female who is being seen today for anxiety. We are currently making medication adjustments. Pt states tomorrow is her first day without Celexa. Pt states she doesn't feel the Cymbalta 30mg  is enough for her states she has been taking the Cymbalta 30mg  for 1 week. Denies any current known side effects to Cymbalta. Pt is asking for higher dose of Cymbalta before next week. States she was so anxious this morning. States she did not take her xanax due to being told by her grandson she was coming off her xanax due to taking in the evening to help her relax and fall asleep. States she had an appointment this morning with GI- Dr. . States she is still going through rectal therapy. States she is unsure it is helping still having to use enema to help with bowel movements.     Past Medical History:   Diagnosis Date   . AAA (abdominal aortic aneurysm) (CMS HCC)    . Back ache    . Bowel trouble     obstruction/nonsurgical   . Chronic headaches    . Depression    . Fibromyalgia    . Osteoarthritis    . Splenic artery aneurysm (CMS [facility])          Past Surgical History:   Procedure Laterality Date   . HX APPENDECTOMY     . HX CESAREAN SECTION      2   . HX CHOLECYSTECTOMY     . HX COLONOSCOPY     . HX LAP CHOLECYSTECTOMY  2012   . HX SINUS SURGERY  2014   . HX WISDOM TEETH EXTRACTION           Current Outpatient Medications   Medication Sig   . ALPRAZolam (XANAX) 0.5 mg Oral Tablet Take 1 Tablet (0.5 mg total) by mouth Every 8 hours as needed for Insomnia or Anxiety   . ascorbic acid (VITAMIN C) 1,000 mg Oral Tablet Take 1,000 mg by mouth Once a day   . ascorbic acid, vitamin C, (VITAMIN C) 250 mg Oral Tablet Take 250 mg by mouth Once a day   .  docusate sodium (STOOL SOFTENER) 100 mg Oral Capsule Take 100 mg by mouth Once a day     . DULoxetine (CYMBALTA DR) 60 mg Oral Capsule, Delayed Release(E.C.) Take 1 Capsule (60 mg total) by mouth Once a day   . Herbal Drugs (COLON HERBAL CLEANSER) Oral Capsule Take by mouth   . Lactobac no.41/Bifidobact no.7 (PROBIOTIC-10 ORAL) Take by mouth   . meloxicam (MOBIC) 15 mg Oral Tablet Take 1 Tablet (15 mg total) by mouth Once a day   . plecanatide (TRULANCE) 3 mg Oral Tablet Take 3 mg by mouth Not taking it reguarly    . vitamin E 100 unit Oral Capsule Take 100 Units by mouth Once a day   . vitamin E 400 unit Oral Capsule Take 400 Units by mouth Once a day   . zinc 50 mg Oral Tablet Take 50 mg by mouth Once a day  No Known Allergies  Family Medical History:     Problem Relation (Age of Onset)    Heart Attack Father (68)    Hypertension (High Blood Pressure) Mother (10)          Social History     Tobacco Use   . Smoking status: Never Smoker   . Smokeless tobacco: Never Used   Vaping Use   . Vaping Use: Never used   Substance Use Topics   . Alcohol use: No   . Drug use: No       Nursing Notes  There are no exam notes on file for this visit.     Review of Systems  Review of Systems   Constitutional: Negative for chills, fever, malaise/fatigue and weight loss.   HENT: Negative for congestion, ear discharge, ear pain, hearing loss, nosebleeds, sinus pain and sore throat.    Eyes: Negative for blurred vision, photophobia and discharge.   Respiratory: Negative for cough, hemoptysis, sputum production, shortness of breath and wheezing.    Cardiovascular: Negative for chest pain, palpitations and leg swelling.   Gastrointestinal: Positive for constipation. Negative for abdominal pain, blood in stool, diarrhea, heartburn, nausea and vomiting.   Genitourinary: Negative for dysuria, frequency, hematuria and urgency.   Musculoskeletal: Negative for back pain, falls, joint pain, myalgias and neck pain.   Skin: Negative for  itching and rash.   Neurological: Negative for dizziness, tingling, tremors, weakness and headaches.   Endo/Heme/Allergies: Negative for environmental allergies. Does not bruise/bleed easily.   Psychiatric/Behavioral: Positive for depression. Negative for memory loss, substance abuse and suicidal ideas. The patient is nervous/anxious and has insomnia.        Physical Exam:  BP 124/84 (Site: Left, Patient Position: Sitting, Cuff Size: Adult)   Pulse 78   Temp 36.4 C (97.5 F) (Tympanic)   Resp 12   Ht 1.549 m (5\' 1" )   Wt 57.2 kg (126 lb)   SpO2 97%   BMI 23.81 kg/m       Physical Exam  Vitals and nursing note reviewed.   Constitutional:       General: She is not in acute distress.     Appearance: She is not diaphoretic.   HENT:      Head: Normocephalic and atraumatic.   Eyes:      Conjunctiva/sclera: Conjunctivae normal.      Pupils: Pupils are equal, round, and reactive to light.   Cardiovascular:      Rate and Rhythm: Normal rate and regular rhythm.      Heart sounds: Normal heart sounds.   Pulmonary:      Effort: Pulmonary effort is normal.      Breath sounds: Normal breath sounds.   Musculoskeletal:         General: Normal range of motion.   Skin:     General: Skin is warm and dry.   Neurological:      Mental Status: She is alert and oriented to person, place, and time.      Coordination: Coordination normal.      Gait: Gait is intact.   Psychiatric:         Mood and Affect: Mood and affect normal.         Behavior: Behavior normal.         Thought Content: Thought content normal.         Cognition and Memory: Memory normal.         Judgment: Judgment normal.  Assessment and Plan    ENCOUNTER DIAGNOSES     ICD-10-CM   1. Generalized anxiety disorder with panic attacks  F41.1    F41.0   2. 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     We will increase Cymbalta from 30mg  daily to 60mg  daily  Pt stops Celexa today  Continue with xanax as needed, as directed  Pt to keep up coming appointment with therapist  F/u 1 week, telephone visit.   She may call or stop in if she needs anything!     Follow up: Return in about 1 week (around 02/27/2020), or if symptoms worsen or fail to improve.      This patient was seen independently.    , PA-C  02/20/2020, 13:13

## 2020-02-27 ENCOUNTER — Telehealth (INDEPENDENT_AMBULATORY_CARE_PROVIDER_SITE_OTHER): Payer: HMO | Admitting: Physician Assistant

## 2020-02-27 ENCOUNTER — Encounter (INDEPENDENT_AMBULATORY_CARE_PROVIDER_SITE_OTHER): Payer: Self-pay | Admitting: Physician Assistant

## 2020-02-27 ENCOUNTER — Telehealth (INDEPENDENT_AMBULATORY_CARE_PROVIDER_SITE_OTHER): Payer: Self-pay | Admitting: Physician Assistant

## 2020-02-27 ENCOUNTER — Other Ambulatory Visit: Payer: Self-pay

## 2020-02-27 DIAGNOSIS — G4700 Insomnia, unspecified: Secondary | ICD-10-CM

## 2020-02-27 DIAGNOSIS — F320 Major depressive disorder, single episode, mild: Secondary | ICD-10-CM

## 2020-02-27 DIAGNOSIS — F410 Panic disorder [episodic paroxysmal anxiety] without agoraphobia: Secondary | ICD-10-CM

## 2020-02-27 DIAGNOSIS — F411 Generalized anxiety disorder: Secondary | ICD-10-CM

## 2020-02-27 MED ORDER — TRAZODONE 50 MG TABLET
50.0000 mg | ORAL_TABLET | Freq: Every evening | ORAL | 3 refills | Status: DC
Start: 2020-02-27 — End: 2020-03-05

## 2020-02-27 MED ORDER — LUBIPROSTONE 8 MCG CAPSULE
8.0000 ug | ORAL_CAPSULE | Freq: Two times a day (BID) | ORAL | Status: DC
Start: 2020-02-27 — End: 2020-03-05

## 2020-02-27 NOTE — Progress Notes (Signed)
FAMILY MEDICINE, [facility]  [address] [place] [address]    Telephone Visit    Name:  Elizabeth Hunter MRN: [id number]   Date:  02/27/2020 Age:   80 y.o.     The patient/family initiated a request for telephone service.  Verbal consent for this service was obtained from the patient/family.    Last office visit in this department: 02/20/2020      Reason for call: anxiety and insomnia.  Start on Cymbalta 60mg . Celexa 40mg  has been d/c. Pt is fearful of continuing to take xanax to help with insomnia. Has taken xanax at bedtime to help with sleep for the past 4-5 years. Takes xanax 0.5mg  at bedtime.    Call notes:  Pt denies any negative side effects at this time to Cymbalta 60mg . States she thinks it might be helping some.     We will have her hold the xanax 0.5mg  at bedtime and start trazodone 50mg  at bedtime.  She will still be able to take the xanax 0.5mg  as needed during the day.     Keep f/u appointments with Dr.     F/u 2 weeks in the office    Stay active  Balanced diet    Keep f/u appointments with colorectal surgeon's office also        ICD-10-CM    1. Insomnia, unspecified type  G47.00    2. Generalized anxiety disorder with panic attacks  F41.1     F41.0    3. 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 traZODone (DESYREL) 50 mg Oral Tablet   . lubiprostone (AMITIZA) 8 mcg Oral Capsule     Total provider time spent with the patient on the phone: 28 minutes.    , PA-C

## 2020-02-28 ENCOUNTER — Encounter (INDEPENDENT_AMBULATORY_CARE_PROVIDER_SITE_OTHER): Payer: Self-pay | Admitting: Physician Assistant

## 2020-03-03 ENCOUNTER — Telehealth (INDEPENDENT_AMBULATORY_CARE_PROVIDER_SITE_OTHER): Payer: Self-pay | Admitting: Family Medicine

## 2020-03-03 ENCOUNTER — Telehealth (INDEPENDENT_AMBULATORY_CARE_PROVIDER_SITE_OTHER): Payer: Self-pay | Admitting: Physician Assistant

## 2020-03-03 ENCOUNTER — Other Ambulatory Visit: Payer: Self-pay

## 2020-03-03 ENCOUNTER — Emergency Department (EMERGENCY_DEPARTMENT_HOSPITAL)
Admission: EM | Admit: 2020-03-03 | Discharge: 2020-03-03 | Disposition: A | Discharge status: Home / Self Care | Payer: HMO | Source: Home / Self Care

## 2020-03-03 DIAGNOSIS — I728 Aneurysm of other specified arteries: Secondary | ICD-10-CM | POA: Diagnosis present

## 2020-03-03 DIAGNOSIS — I714 Abdominal aortic aneurysm, without rupture: Secondary | ICD-10-CM | POA: Diagnosis present

## 2020-03-03 DIAGNOSIS — M797 Fibromyalgia: Secondary | ICD-10-CM | POA: Diagnosis present

## 2020-03-03 DIAGNOSIS — F13239 Sedative, hypnotic or anxiolytic dependence with withdrawal, unspecified: Secondary | ICD-10-CM | POA: Insufficient documentation

## 2020-03-03 DIAGNOSIS — F410 Panic disorder [episodic paroxysmal anxiety] without agoraphobia: Secondary | ICD-10-CM | POA: Diagnosis present

## 2020-03-03 DIAGNOSIS — F1320 Sedative, hypnotic or anxiolytic dependence, uncomplicated: Secondary | ICD-10-CM

## 2020-03-03 DIAGNOSIS — I498 Other specified cardiac arrhythmias: Secondary | ICD-10-CM

## 2020-03-03 DIAGNOSIS — Z79899 Other long term (current) drug therapy: Secondary | ICD-10-CM

## 2020-03-03 DIAGNOSIS — G4700 Insomnia, unspecified: Secondary | ICD-10-CM | POA: Diagnosis present

## 2020-03-03 DIAGNOSIS — M549 Dorsalgia, unspecified: Secondary | ICD-10-CM | POA: Diagnosis present

## 2020-03-03 DIAGNOSIS — F331 Major depressive disorder, recurrent, moderate: Secondary | ICD-10-CM | POA: Diagnosis present

## 2020-03-03 LAB — B-TYPE NATRIURETIC PEPTIDE: BNP: 12 pg/mL (ref <=99)

## 2020-03-03 LAB — ECG 12-LEAD
Atrial Rate: 89 {beats}/min
Calculated P Axis: 55 degrees
Calculated R Axis: 14 degrees
Calculated T Axis: 47 degrees
PR Interval: 142 ms
QRS Duration: 78 ms
QT Interval: 372 ms
QTC Calculation: 452 ms
Ventricular rate: 89 {beats}/min

## 2020-03-03 LAB — DRUG SCREEN, WITH CONFIRMATION, URINE
AMPHETAMINES, URINE: NEGATIVE
BARBITURATES URINE: NEGATIVE
BENZODIAZEPINES URINE: POSITIVE — AB
BUPRENORPHINE URINE: NEGATIVE
CANNABINOIDS URINE: NEGATIVE
COCAINE METABOLITES URINE: NEGATIVE
CREATININE RANDOM URINE: 148 mg/dL — ABNORMAL HIGH (ref 50–100)
ECSTASY/MDMA URINE: NEGATIVE
FENTANYL, RANDOM URINE: NEGATIVE
METHADONE URINE: NEGATIVE
OPIATES URINE (LOW CUTOFF): NEGATIVE
OXYCODONE URINE: NEGATIVE

## 2020-03-03 LAB — CBC WITH DIFF
BASOPHIL #: 0.1 10*3/uL (ref 0.00–0.30)
BASOPHIL %: 1 % (ref 0–1)
EOSINOPHIL #: 0.1 10*3/uL (ref 0.00–0.50)
EOSINOPHIL %: 2 % (ref 0–4)
HCT: 38.1 % (ref 37.0–47.0)
HGB: 12.9 g/dL (ref 12.5–16.0)
LYMPHOCYTE #: 1.4 10*3/uL (ref 0.90–4.80)
LYMPHOCYTE %: 22 % — ABNORMAL LOW (ref 23–35)
MCH: 31.6 pg (ref 28.0–33.0)
MCHC: 33.7 g/dL (ref 32.0–37.0)
MCV: 93.7 fL (ref 78.0–100.0)
MONOCYTE #: 0.4 10*3/uL (ref 0.30–0.90)
MONOCYTE %: 6 % (ref 0–12)
NEUTROPHIL #: 4.3 10*3/uL (ref 1.70–7.00)
NEUTROPHIL %: 69 % (ref 50–70)
PLATELETS: 231 10*3/uL (ref 130–400)
RBC: 4.07 10*6/uL — ABNORMAL LOW (ref 4.20–5.40)
RDW: 13.1 % (ref 9.9–16.5)
WBC: 6.3 10*3/uL (ref 4.5–11.0)

## 2020-03-03 LAB — BASIC METABOLIC PANEL
ANION GAP: 5 mmol/L (ref 4–13)
BUN/CREA RATIO: 21 (ref 6–22)
BUN: 17 mg/dL (ref 8–25)
CALCIUM: 9.5 mg/dL (ref 8.8–10.2)
CHLORIDE: 108 mmol/L (ref 96–111)
CO2 TOTAL: 28 mmol/L (ref 23–31)
CREATININE: 0.8 mg/dL (ref 0.60–1.05)
ESTIMATED GFR: 71 mL/min/BSA (ref >=60)
GLUCOSE: 88 mg/dL (ref 65–125)
POTASSIUM: 4.1 mmol/L (ref 3.5–5.1)
SODIUM: 141 mmol/L (ref 136–145)

## 2020-03-03 LAB — TROPONIN-I: TROPONIN I: <7 ng/L — ABNORMAL LOW (ref 7–30)

## 2020-03-03 LAB — ETHANOL, SERUM: ETHANOL: NOT DETECTED

## 2020-03-03 LAB — THYROID STIMULATING HORMONE WITH FREE T4 REFLEX: TSH: 1.92 u[IU]/mL (ref 0.430–3.550)

## 2020-03-03 NOTE — ED Triage Notes (Signed)
80yr old female presents wanting to go though [facility] to get off her xanax, she has been taking it for the last 45yrs but has started taking 1/2 tab during the day, "I wake up in a panic, I'm ready for the [facility] [place]"

## 2020-03-03 NOTE — Telephone Encounter (Signed)
Please have her go to the ER! If she was advised by another provider to go to the ER. I want her to go to the ER.

## 2020-03-03 NOTE — ED Nurses Note (Signed)
Called dietary for a tray.

## 2020-03-03 NOTE — Discharge Instructions (Signed)
Please call the breakthrough program when you begin to have withdrawals to discuss treatment options.  Please call your family physician to also discuss possible outpatient tapering of your medications.  Return for new worsening symptoms.

## 2020-03-03 NOTE — ED Nurses Note (Signed)
Patient stable for discharge with all belongings. Encouraged patient to call [facility] program when having withdrawal symptoms.

## 2020-03-03 NOTE — Telephone Encounter (Signed)
Elizabeth Hunter is here, she came here straight from seeing Britta Mccreedy rush the therapist  She said that she wanted her to go to the er . She said Britta Mccreedy was faxing info to you regarding tis.  Since that still had not come i called, barbara answerwed   She saidher concern is that she is showing signs of severe panic disorder and Elizabeth Hunter thinks it is due to use of xanax and how it affects her when she is coming off of it.  She has used it for many years.  Britta Mccreedy suggested that she go to the er to try and get set up with the break through program to help her with the taking of these meds and the effects they have on her.  Elizabeth Hunter told Britta Mccreedy she has lost over 25lbns recently also and she was very concerned about that also    Elizabeth Hunter wants to know what your thoughts are on all of this before going to the er   She is still here with her husband

## 2020-03-03 NOTE — ED Provider Notes (Signed)
Name: Elizabeth Hunter DOB:    Age: 80 y.o. Gender: female       Chief Complaint:   Chief Complaint   Patient presents with    Withdrawal- Drug     wants to get off her xanax       HPI: This is a 80 y.o. female who presents to the emergency department for benzodiazepine withdrawal.  Patient states she has been taking Xanax for approximately 3 years.  States she has been attempting to titrate herself off of this by taking a half of a Xanax pill every night but she frequently finds that she needs to take another half pill in the morning.  States when she does not have that 2nd dose she feels very anxious, has palpitations, and cannot calm down states she is very shaky.  States she had to take an additional 0.5 mg Xanax this morning prior to being able to even drive here. States without a second dose of Xanax in the AM, she "cannot function." Reports wanting to completely be off of the xanax.    Past Medical History:   Past Medical History:   Diagnosis Date    AAA (abdominal aortic aneurysm) (CMS HCC)     Back ache     Bowel trouble     obstruction/nonsurgical    Chronic headaches     Depression     Fibromyalgia     Osteoarthritis     Splenic artery aneurysm (CMS HCC)        Past Surgical History:   Past Surgical History:   Procedure Laterality Date    Hx appendectomy      Hx cesarean section      Hx cholecystectomy      Hx colonoscopy      Hx lap cholecystectomy  2012    Hx sinus surgery  2014    Hx wisdom teeth extraction         Social History:   Social History     Tobacco Use    Smoking status: Never Smoker    Smokeless tobacco: Never Used   Haematologist Use: Never used   Substance Use Topics    Alcohol use: No    Drug use: No      Social History     Substance and Sexual Activity   Drug Use No       Allergies: No Known Allergies        Review of Systems: Other than pertinent positives discussed in HPI, all other systems reviewed and are negative.      Physical Exam:    General:   Patient alert and oriented x4.  No acute distress.  BP 132/77    Pulse 92    Temp 36.8 C (98.2 F)    Resp 16    Ht 1.549 m (5\' 1" )    Wt 57.2 kg (126 lb)    SpO2 96%    BMI 23.81 kg/m       HEENT:  Head:  Normocephalic, atraumatic.  Eyes: Normal conjunctiva. Oropharynx: Oral mucosa is moist.   Neck: Supple.  No lymphadenopathy.  Respiratory:  Chest clear to auscultation bilaterally. No rales, rhonchi, or wheezes.  No respiratory distress.   Cardiovascular:  Heart regular rate and rhythm without murmurs, rubs, or gallops.    Abdomen: Soft, non-tender, bowel sounds positive.    Extremities:  Normal pulses. No clubbing, cyanosis, or edema.  MS:  No ROM deficit.  Normal strength.  Neuro: Alert and oriented x4.  No focal motor or sensory deficits.    Skin:  Warm and dry. No rashes or lesions.      Medical Decision Making:   Patient was triaged, vital signs were obtained, patient was  placed in a room.  I did examine the patient.  After examining the patient, IV was established. Patients CIWA 0. Lab work without concerning abnormalities.  Patient did speak to breakthrough program, does not meet criteria at this time.  Patient was observed for additional 2 hours, did not meet criteria for inpatient admission. CIWA 8 at time of discharge. Given information for [facility] advised to call them when she begins to have worsening withdrawal symptoms.  Return for new or worsening symptoms.      This note was partially created using voice recognition software and is inherently subject to errors including those of syntax and "sound alike " substitutions which may escape proof reading.  In such instances, original meaning may be extrapolated by contextual derivation.     Pertinent Imaging and Laboratory results during ED course following impression.       Clinical Impression:   Encounter Diagnosis   Name Primary?    Benzodiazepine dependence (CMS HCC) Yes       Disposition: Discharged      Orders Placed This Encounter     CBC/DIFF    BASIC METABOLIC PANEL    TROPONIN-I    THYROID STIMULATING HORMONE WITH FREE T4 REFLEX    B-TYPE NATRIURETIC PEPTIDE    DRUG SCREEN, WITH CONFIRMATION, URINE    CBC WITH DIFF    ETHANOL, SERUM    CANCELED: DRUG MONITOR, BENZO, QN, URINE    ECG 12-LEAD    INSERT & MAINTAIN PERIPHERAL IV ACCESS    CANCELED: PERIPHERAL IV DRESSING CHANGE       Labs:  Labs Reviewed   TROPONIN-I - Abnormal; Notable for the following components:       Result Value    TROPONIN I <7 (*)     All other components within normal limits   DRUG SCREEN, WITH CONFIRMATION, URINE - Abnormal; Notable for the following components:    BENZODIAZEPINES URINE Positive (*)     CREATININE RANDOM URINE 148 (*)     All other components within normal limits   CBC WITH DIFF - Abnormal; Notable for the following components:    RBC 4.07 (*)     LYMPHOCYTE % 22 (*)     All other components within normal limits   BASIC METABOLIC PANEL - Normal   THYROID STIMULATING HORMONE WITH FREE T4 REFLEX - Normal   B-TYPE NATRIURETIC PEPTIDE - Normal   CBC/DIFF    Narrative:     The following orders were created for panel order CBC/DIFF.  Procedure                               Abnormality         Status                     ---------                               -----------         ------                     CBC  WITH UUEK[[id number]]                Abnormal            Final result                 Please view results for these tests on the individual orders.   ETHANOL, SERUM   DRUG MONITOR, BENZO, QN, URINE         Radiology:             Procedures

## 2020-03-04 ENCOUNTER — Telehealth (INDEPENDENT_AMBULATORY_CARE_PROVIDER_SITE_OTHER): Payer: Self-pay | Admitting: Family Medicine

## 2020-03-04 ENCOUNTER — Inpatient Hospital Stay
Admission: EM | Admit: 2020-03-04 | Discharge: 2020-03-05 | DRG: 897 | Disposition: A | Discharge status: Home / Self Care | Payer: HMO | Attending: Internal Medicine | Admitting: Internal Medicine

## 2020-03-04 ENCOUNTER — Encounter (HOSPITAL_COMMUNITY): Payer: Self-pay

## 2020-03-04 ENCOUNTER — Inpatient Hospital Stay (HOSPITAL_COMMUNITY): Payer: HMO | Admitting: Internal Medicine

## 2020-03-04 ENCOUNTER — Emergency Department (HOSPITAL_COMMUNITY): Payer: HMO

## 2020-03-04 DIAGNOSIS — F13939 Sedative, hypnotic or anxiolytic use, unspecified with withdrawal, unspecified: Secondary | ICD-10-CM | POA: Diagnosis present

## 2020-03-04 DIAGNOSIS — F1320 Sedative, hypnotic or anxiolytic dependence, uncomplicated: Secondary | ICD-10-CM

## 2020-03-04 DIAGNOSIS — F13239 Sedative, hypnotic or anxiolytic dependence with withdrawal, unspecified: Principal | ICD-10-CM

## 2020-03-04 DIAGNOSIS — F419 Anxiety disorder, unspecified: Secondary | ICD-10-CM

## 2020-03-04 DIAGNOSIS — G4700 Insomnia, unspecified: Secondary | ICD-10-CM

## 2020-03-04 DIAGNOSIS — R079 Chest pain, unspecified: Secondary | ICD-10-CM

## 2020-03-04 LAB — COVID-19 ~~LOC~~ MOLECULAR LAB TESTING
INFLUENZA VIRUS TYPE A: NOT DETECTED
INFLUENZA VIRUS TYPE B: NOT DETECTED
RESPIRATORY SYNCTIAL VIRUS (RSV): NOT DETECTED
SARS-CoV-2: NOT DETECTED

## 2020-03-04 LAB — TROPONIN-I: TROPONIN I: <7 ng/L — ABNORMAL LOW (ref 7–30)

## 2020-03-04 MED ORDER — ASCORBIC ACID (VITAMIN C) 500 MG TABLET
1000.0000 mg | ORAL_TABLET | Freq: Every day | ORAL | Status: DC
Start: 2020-03-04 — End: 2020-03-05
  Administered 2020-03-04 – 2020-03-05 (×2): 1000 mg via ORAL
  Filled 2020-03-04 (×2): qty 2

## 2020-03-04 MED ORDER — HYDROXYZINE HCL 25 MG TABLET
25.0000 mg | ORAL_TABLET | Freq: Four times a day (QID) | ORAL | Status: DC | PRN
Start: 2020-03-04 — End: 2020-03-05

## 2020-03-04 MED ORDER — ACETAMINOPHEN 325 MG TABLET
650.0000 mg | ORAL_TABLET | ORAL | Status: DC | PRN
Start: 2020-03-04 — End: 2020-03-05

## 2020-03-04 MED ORDER — LORAZEPAM 2 MG/ML INJECTION SOLUTION
1.0000 mg | INTRAMUSCULAR | Status: AC
Start: 2020-03-04 — End: 2020-03-04
  Administered 2020-03-04: 10:00:00 1 mg via INTRAVENOUS
  Filled 2020-03-04: qty 1

## 2020-03-04 MED ORDER — TRAZODONE 50 MG TABLET
50.0000 mg | ORAL_TABLET | Freq: Every evening | ORAL | Status: DC
Start: 2020-03-04 — End: 2020-03-04

## 2020-03-04 MED ORDER — LUBIPROSTONE 8 MCG CAPSULE
8.0000 ug | ORAL_CAPSULE | Freq: Two times a day (BID) | ORAL | Status: DC
Start: 2020-03-04 — End: 2020-03-04
  Filled 2020-03-04 (×2): qty 1

## 2020-03-04 MED ORDER — ZINC AMINO ACID CHELATE 50 MG TABLET
50.0000 mg | ORAL_TABLET | Freq: Every day | ORAL | Status: DC
Start: 2020-03-04 — End: 2020-03-05
  Administered 2020-03-04 – 2020-03-05 (×2): 50 mg via ORAL
  Filled 2020-03-04 (×2): qty 1

## 2020-03-04 MED ORDER — ONDANSETRON HCL (PF) 4 MG/2 ML INJECTION SOLUTION
4.0000 mg | Freq: Four times a day (QID) | INTRAMUSCULAR | Status: DC | PRN
Start: 2020-03-04 — End: 2020-03-05

## 2020-03-04 MED ORDER — CLONAZEPAM 0.5 MG TABLET
0.5000 mg | ORAL_TABLET | Freq: Two times a day (BID) | ORAL | Status: DC
Start: 2020-03-04 — End: 2020-03-05
  Administered 2020-03-04 – 2020-03-05 (×2): 0.5 mg via ORAL
  Filled 2020-03-04 (×2): qty 1

## 2020-03-04 MED ORDER — MAGNESIUM HYDROXIDE 400 MG/5 ML ORAL SUSPENSION
15.0000 mL | Freq: Every day | ORAL | Status: DC | PRN
Start: 2020-03-04 — End: 2020-03-05

## 2020-03-04 MED ORDER — DULOXETINE 60 MG CAPSULE,DELAYED RELEASE
60.0000 mg | DELAYED_RELEASE_CAPSULE | Freq: Every day | ORAL | Status: DC
Start: 2020-03-05 — End: 2020-03-05
  Administered 2020-03-05: 60 mg via ORAL
  Filled 2020-03-04: qty 1

## 2020-03-04 MED ORDER — SODIUM CHLORIDE 0.45 % INTRAVENOUS SOLUTION
INTRAVENOUS | Status: DC
Start: 2020-03-04 — End: 2020-03-04

## 2020-03-04 MED ORDER — ENOXAPARIN 40 MG/0.4 ML SUB-Q SYRINGE - EAST
40.0000 mg | INJECTION | SUBCUTANEOUS | Status: DC
Start: 2020-03-04 — End: 2020-03-05
  Administered 2020-03-04: 40 mg via SUBCUTANEOUS
  Filled 2020-03-04: qty 0.4

## 2020-03-04 MED ORDER — MELOXICAM 15 MG TABLET
15.0000 mg | ORAL_TABLET | Freq: Every day | ORAL | Status: DC
Start: 2020-03-04 — End: 2020-03-05
  Administered 2020-03-04 – 2020-03-05 (×2): 15 mg via ORAL
  Filled 2020-03-04 (×2): qty 1

## 2020-03-04 NOTE — ED Provider Notes (Signed)
Elizabeth Hunter    80 y.o.  female    Chief Complaint:   Chief Complaint   Patient presents with   . Detox Evaluation        HPI: This is a 80 y.o. female who presents to the emergency department via private vehicle.  Patient was seen last night requesting a check into the breakthrough program for detox from Xanax.  She states she has not taken any today.  She feels restless, nauseated and shaky.  She admits to a headache.  She was told not to take any Xanax and come back this morning for evaluation since she did not meet criteria for the breakthrough program yesterday.  She is prescribed Xanax 0.5 mg every 8 hours as needed for insomnia or anxiety.  She is also taking Cymbalta.  She does have a history of anxiety and panic attacks.  States a history of depression.  She states she is developing some chest pain and nausea.  She describes the pain as sharp in nature.  She feels very shaky and anxious.  She admits to some paresthesias in her hands and feet.  ?   Past Medical History:   Past Medical History:   Diagnosis Date   . AAA (abdominal aortic aneurysm) (CMS HCC)    . Back ache    . Bowel trouble     obstruction/nonsurgical   . Chronic headaches    . Depression    . Fibromyalgia    . Osteoarthritis    . Splenic artery aneurysm (CMS [facility])       Past Surgical History:   Past Surgical History:   Procedure Laterality Date   . Hx appendectomy     . Hx cesarean section     . Hx cholecystectomy     . Hx colonoscopy     . Hx lap cholecystectomy  2012   . Hx sinus surgery  2014   . Hx wisdom teeth extraction        Social History:   Social History     Tobacco Use   . Smoking status: Never Smoker   . Smokeless tobacco: Never Used   Vaping Use   . Vaping Use: Never used   Substance Use Topics   . Alcohol use: No   . Drug use: No      Social History     Substance and Sexual Activity   Drug Use No      Allergies: No Known Allergies   ?     Review of Systems: Other than pertinent positives discussed in HPI, all other  systems reviewed and are negative.  ?   Physical Exam:     BP 118/69   Pulse 92   Temp 36.2 C (97.2 F)   Resp 20   Ht 1.549 m (5\' 1" )   Wt 57.2 kg (126 lb)   SpO2 99%   BMI 23.81 kg/m        General: Patient alert and oriented.  Appears to be very anxious.  She does have some hyperventilation.  HEENT: Head: Normocephalic, atraumatic. Eyes:  Pupils equal and reactive bilaterally.  Extraocular muscles intact in all directions.  Normal conjunctiva. Oral mucosa moist.  Respiratory: No respiratory distress noted.  Lungs are clear and equal bilaterally.  No wheezing, rales, or rhonchi.  She is mildly tachypneic.  No retractions or use of accessory muscles.  Heart:  Heart was tachycardic and regular without murmur.  Strong peripheral pulses present distally bilaterally.  Abdomen:  Abdomen was soft, nontender.  No rebound guarding or masses noted.  Bowel sounds were present.  No masses noted.  MS: Appropriate ROM. Normal Strength.  No calf tenderness appreciated.  No cords palpated.  Neuro: Alert and oriented to person, place, time, and situation. CN II-XII grossly intact.  No focal motor or sensory deficits. GCS 15.  Skin: Warm and dry. No cyanosis.  No rash appreciate.      LAB:   Results for orders placed or performed during the hospital encounter of 03/04/20 (from the past 12 hour(s))   TROPONIN-I   Result Value Ref Range    TROPONIN I <7 (L) 7 - 30 ng/L          Radiology:  Portable chest x-ray was obtained and interpreted by Radiology showing:          EKG:  12 lead EKG was obtained and interpreted myself showing a sinus rhythm at 83 beats per minute.  PR interval 140 milliseconds.  QRS axis 11.  No acute ST segment elevation or depression or ectopy noted.      Emergency room course and Medical Decision Making: Patient was triaged, vital signs were obtained, patient was placed in a room. Nurses notes reviewed.  H&P was obtained. After examining the patient, Hep-Lock was established.  She was placed on a  cardiac monitor pulse oximeter.  Twelve lead EKG was performed.  Blood was drawn for troponin I. She did have blood work done yesterday which included CBC, chemistry and TSH and urine drug screen.  I did review that lab.  I did not feel need to repeat those lab test.  We did contact breakthrough program to have some assess her to see if she meets criteria.?  Her CIWA was 16. She was given Ativan 1 mg IV.  Portable chest x-ray was obtained.  ?   This note was partially created using voice recognition software and is inherently subject to errors including those of syntax and "sound alike " substitutions which may escape proof reading.  In such instances, original meaning may be extrapolated by contextual derivation.     ?   Clinical Impression:   Encounter Diagnoses   Name Primary?   . Benzodiazepine withdrawal with complication (CMS HCC) Yes   . Benzodiazepine dependence (CMS HCC)    . Anxiety    . Insomnia, unspecified type         Disposition: Admitted    Vernie Ammons, DO           Procedures

## 2020-03-04 NOTE — ED Nurses Note (Signed)
Ordered pt food.

## 2020-03-04 NOTE — ED Nurses Note (Signed)
Pt states she feels better and is calm.

## 2020-03-04 NOTE — Care Plan (Signed)
Alert and oriented x4. Patient appears to be very anxious. Only symptoms she c/o is a headache and body aches. Skin is clean,dry, and intact. Lungs are clear. Respirations are easy non labored on RA. Denies any Sob. Abdomen is soft, non-tender, non distended. Denies any v/d. Voiding without difficulty. Encourage frequent weight changed to prevent skin breakdown. Pedal pulses are +2 bilaterally. Fall prevention maintained. Call bell and personal belonging with in reach.

## 2020-03-04 NOTE — ED Nurses Note (Signed)
Pt states she was nauseous so pt was given emesis bag.

## 2020-03-04 NOTE — ED Nurses Note (Signed)
Patient stated if she did not get help to get off Xanax she was afraid she would go home and harm herself.

## 2020-03-04 NOTE — Ancillary Notes (Signed)
Consult called to Dr Papadimitriou's office . Elizabeth Hunter  03/04/2020, 17:50

## 2020-03-04 NOTE — ED Nurses Note (Signed)
Patient at doorway requesting cardiac monitor leads be removed. Leads removed and patient assisted back to bed.

## 2020-03-04 NOTE — ED Nurses Note (Signed)
Report given to Tresa RN on [facility]

## 2020-03-04 NOTE — ED Nurses Note (Signed)
PT ambulated to and from restroom and refused help.

## 2020-03-04 NOTE — ED Triage Notes (Signed)
Patient was seen here last night requesting to check into [facility] for detox from Xanax. Reports she has not taken any today. Reports this morning she feels terrible. Complains of feeling restless, nausea, headache, shakiness. States she keeps feeling like she is going to have a panic attack. Was told to not take any Xanax and come back this AM.

## 2020-03-04 NOTE — Telephone Encounter (Signed)
edy returned your call

## 2020-03-04 NOTE — ED Nurses Note (Signed)
Pt ate food.

## 2020-03-04 NOTE — Telephone Encounter (Signed)
Was able to talk with the patient's husband  She was admitted to [facility] at [facility] today.

## 2020-03-05 ENCOUNTER — Telehealth (INDEPENDENT_AMBULATORY_CARE_PROVIDER_SITE_OTHER): Payer: Self-pay | Admitting: Physician Assistant

## 2020-03-05 DIAGNOSIS — F331 Major depressive disorder, recurrent, moderate: Secondary | ICD-10-CM

## 2020-03-05 LAB — COMPREHENSIVE METABOLIC PNL, FASTING
ALBUMIN: 3.7 g/dL (ref 3.4–4.8)
ALKALINE PHOSPHATASE: 41 U/L — ABNORMAL LOW (ref 55–145)
ALT (SGPT): 21 U/L (ref 8–22)
ANION GAP: 6 mmol/L (ref 4–13)
AST (SGOT): 18 U/L (ref 8–45)
BILIRUBIN TOTAL: 0.6 mg/dL (ref 0.3–1.3)
BUN/CREA RATIO: 20 (ref 6–22)
BUN: 16 mg/dL (ref 8–25)
CALCIUM: 9.6 mg/dL (ref 8.8–10.2)
CHLORIDE: 109 mmol/L (ref 96–111)
CO2 TOTAL: 27 mmol/L (ref 23–31)
CREATININE: 0.8 mg/dL (ref 0.60–1.05)
ESTIMATED GFR: 71 mL/min/BSA (ref >=60)
GLUCOSE: 88 mg/dL (ref 70–99)
POTASSIUM: 4.3 mmol/L (ref 3.5–5.1)
PROTEIN TOTAL: 6.4 g/dL (ref 6.0–8.0)
SODIUM: 142 mmol/L (ref 136–145)

## 2020-03-05 LAB — CBC WITH DIFF
BASOPHIL #: 0.1 10*3/uL (ref 0.00–0.30)
BASOPHIL %: 1 % (ref 0–1)
EOSINOPHIL #: 0.1 10*3/uL (ref 0.00–0.50)
EOSINOPHIL %: 3 % (ref 0–4)
HCT: 36.1 % — ABNORMAL LOW (ref 37.0–47.0)
HGB: 12.4 g/dL — ABNORMAL LOW (ref 12.5–16.0)
LYMPHOCYTE #: 1.6 10*3/uL (ref 0.90–4.80)
LYMPHOCYTE %: 32 % (ref 23–35)
MCH: 32.3 pg (ref 28.0–33.0)
MCHC: 34.4 g/dL (ref 32.0–37.0)
MCV: 93.8 fL (ref 78.0–100.0)
MONOCYTE #: 0.4 10*3/uL (ref 0.30–0.90)
MONOCYTE %: 7 % (ref 0–12)
NEUTROPHIL #: 2.7 10*3/uL (ref 1.70–7.00)
NEUTROPHIL %: 56 % (ref 50–70)
PLATELETS: 196 10*3/uL (ref 130–400)
RBC: 3.84 10*6/uL — ABNORMAL LOW (ref 4.20–5.40)
RDW: 13 % (ref 9.9–16.5)
WBC: 4.9 10*3/uL (ref 4.5–11.0)

## 2020-03-05 MED ORDER — CLONAZEPAM 0.5 MG TABLET
0.5000 mg | ORAL_TABLET | Freq: Two times a day (BID) | ORAL | 0 refills | Status: DC
Start: 2020-03-05 — End: 2020-03-12

## 2020-03-05 NOTE — H&P (Signed)
[facility]  General Medicine  Admission H&P    Date of Service:  03/05/2020  Elizabeth Hunter, Elizabeth Hunter, 80 y.o. female  Date of Admission:  03/04/2020  Date of Birth:    PCP: Cecilie Kicks, DO    Chief Complaint:  Benzodiazepine abuse      HPI: Elizabeth Hunter is a 80 y.o., White female who presents with excessive use of a xanax  Elderly white female who had a problem with depression and some anxiety patient was started on xanax to make her sleepy  Heart rate over a period of time patient started to urinate more often pain initially she was using it twice a day then had to started sometimes take it 3 times a day   Was making had her jittery  Recently he is on Cymbalta which she is tolerating it to well  Patient was in the ER yesterday trying to get off Xanax  I which she was not admitted as she was not fulfilling the criteria for admission for acute care  Patient was sent home after going home she just did not feel well and started to become tachycardic and came back to the ER for further management    History:    Past Medical:    Past Medical History:   Diagnosis Date    AAA (abdominal aortic aneurysm) (CMS HCC)     Back ache     Bowel trouble     obstruction/nonsurgical    Chronic headaches     Depression     Fibromyalgia     Osteoarthritis     Splenic artery aneurysm (CMS HCC)       Past Surgical:    Past Surgical History:   Procedure Laterality Date    HX APPENDECTOMY      HX CESAREAN SECTION      2    HX CHOLECYSTECTOMY      HX COLONOSCOPY      HX LAP CHOLECYSTECTOMY  2012    HX SINUS SURGERY  2014    HX WISDOM TEETH EXTRACTION        Family:    Family Medical History:     Problem Relation (Age of Onset)    Heart Attack Father (68)    Hypertension (High Blood Pressure) Mother (80)         Social:   reports that she has never smoked. She has never used smokeless tobacco. She reports current drug use. Drug: Benzodiazepines. She reports that she does not drink alcohol.     No Known  Allergies  Medications Prior to Admission     Prescriptions    ALPRAZolam (XANAX) 0.5 mg Oral Tablet    Take 1 Tablet (0.5 mg total) by mouth Every 8 hours as needed for Insomnia or Anxiety    ascorbic acid (VITAMIN C) 1,000 mg Oral Tablet    Take 1,000 mg by mouth Once a day    ascorbic acid, vitamin C, (VITAMIN C) 250 mg Oral Tablet    Take 250 mg by mouth Once a day    DULoxetine (CYMBALTA DR) 60 mg Oral Capsule, Delayed Release(E.C.)    Take 1 Capsule (60 mg total) by mouth Once a day    Herbal Drugs Oral Capsule    Take by mouth    Lactobac no.41/Bifidobact no.7 (PROBIOTIC-10 ORAL)    Take by mouth    lubiprostone (AMITIZA) 8 mcg Oral Capsule    Take 1 Capsule (8 mcg total) by mouth Twice daily  Patient not taking:  Reported on 03/04/2020    meloxicam (MOBIC) 15 mg Oral Tablet    Take 1 Tablet (15 mg total) by mouth Once a day    traZODone (DESYREL) 50 mg Oral Tablet    Take 1 Tablet (50 mg total) by mouth Every night    Patient not taking:  Reported on 03/04/2020    vitamin E 100 unit Oral Capsule    Take 100 Units by mouth Once a day    vitamin E 400 unit Oral Capsule    Take 400 Units by mouth Once a day    zinc 50 mg Oral Tablet    Take 50 mg by mouth Once a day        acetaminophen (TYLENOL) tablet, 650 mg, Oral, Q4H PRN  ascorbic acid (VITAMIN C) tablet, 1,000 mg, Oral, Daily  clonazePAM (KLONOPIN) tablet, 0.5 mg, Oral, 2x/day  DULoxetine (CYMBALTA) delayed release capsule, 60 mg, Oral, Daily  enoxaparin (LOVENOX) 40 mg/0.4 mL SubQ injection, 40 mg, Subcutaneous, Q24H  hydrOXYzine (ATARAX) tablet, 25 mg, Oral, Q6H PRN  magnesium hydroxide (MILK OF MAGNESIA) 400mg  per 7mL oral liquid, 15 mL, Oral, Daily PRN  meloxicam (MOBIC) tablet, 15 mg, Oral, Daily  ondansetron (ZOFRAN) 2 mg/mL injection, 4 mg, Intravenous, Q6H PRN  zinc amino acid chelate 50 mg tablet, 50 mg, Oral, Daily        ROS:   Constitutional: Negative for fever. Negative for chills.   Skin: Negative for rash or other lesions.   HENT: Negative  for headaches.   Eyes: Negative for blurred vision and double vision.   Cardiovascular:  Palpitation   Respiratory: Negative for cough. Is not experiencing shortness of breath. No wheezing.  Gastrointestinal: Negative for nausea, vomiting and abdominal pain. No diarrhea, constipation or hematochezia  Genitourinary: Negative for dysuria, urgency, frequency, or hematuria.   Musculoskeletal: Negative for neck pain and back pain.   Neurological: Negative for dizziness and loss of consciousness.  No numbness, tingling, or other sensation changed.  Psych:  Anxiety and depression  All other systems reviewed and are negative.    Examination:  Temperature: 36.6 C (97.9 F) Heart Rate: 79 BP (Non-Invasive): (!) 117/52   Respiratory Rate: (!) 22 SpO2: 97 %       Exam:  Nursing note and vitals reviewed.   Constitutional: Patient is in mild acute distress.    HEENT: Head normocephalic and atraumatic.   Eyes: EOMI, PERRL, conjunctiva is without erythema or discharge.  Nose: Normal mucosa.  Throat: Clear without erythema. Oropharynx moist.    Neck: Supple. Soft, FROM  Lungs: Clear to auscultation bilaterally without wheezes, rales or rhonchi.  Cardiovascular: Regular rate and rhythm. No murmur, rub, or gallop. Pulses strong and equal bilaterally.  Abdomen: Normal bowel sounds. Soft, nontender, nondistended. No rebound, Guarding, or masses noted.   MSK/Extremities: Moves all extremities. 5/5 strength throughout. Pulses equal bilaterally.  Skin: No lesions noted.  No rashes.  Neuro: Normal sensation and strength bilaterally. Normal gait. Normal coordination. CNs 2-12 grossly intact. No facial droop noted.    Psych: Alert and oriented to person, place, and time. Normal affect    Labs:     Results for orders placed or performed during the hospital encounter of 03/04/20 (from the past 24 hour(s))   COMPREHENSIVE METABOLIC PNL, FASTING   Result Value Ref Range    SODIUM 142 136 - 145 mmol/L    POTASSIUM 4.3 3.5 - 5.1 mmol/L    CHLORIDE  109 96 -  111 mmol/L    CO2 TOTAL 27 23 - 31 mmol/L    ANION GAP 6 4 - 13 mmol/L    BUN 16 8 - 25 mg/dL    CREATININE 4.19 3.79 - 1.05 mg/dL    BUN/CREA RATIO 20 6 - 22    ESTIMATED GFR 71 >=60 mL/min/BSA    ALBUMIN 3.7 3.4 - 4.8 g/dL     CALCIUM 9.6 8.8 - 02.4 mg/dL    GLUCOSE 88 70 - 99 mg/dL    ALKALINE PHOSPHATASE 41 (L) 55 - 145 U/L    ALT (SGPT) 21 8 - 22 U/L    AST (SGOT)  18 8 - 45 U/L    BILIRUBIN TOTAL 0.6 0.3 - 1.3 mg/dL    PROTEIN TOTAL 6.4 6.0 - 8.0 g/dL   CBC WITH DIFF   Result Value Ref Range    WBC 4.9 4.5 - 11.0 x103/uL    RBC 3.84 (L) 4.20 - 5.40 x106/uL    HGB 12.4 (L) 12.5 - 16.0 g/dL    HCT 09.7 (L) 35.3 - 47.0 %    MCV 93.8 78.0 - 100.0 fL    MCH 32.3 28.0 - 33.0 pg    MCHC 34.4 32.0 - 37.0 g/dL    RDW 29.9 9.9 - 24.2 %    PLATELETS 196 130 - 400 x103/uL    NEUTROPHIL % 56 50 - 70 %    LYMPHOCYTE % 32 23 - 35 %    MONOCYTE % 7 0 - 12 %    EOSINOPHIL % 3 0 - 4 %    BASOPHIL % 1 0 - 1 %    NEUTROPHIL # 2.70 1.70 - 7.00 x103/uL    LYMPHOCYTE # 1.60 0.90 - 4.80 x103/uL    MONOCYTE # 0.40 0.30 - 0.90 x103/uL    EOSINOPHIL # 0.10 0.00 - 0.50 x103/uL    BASOPHIL # 0.10 0.00 - 0.30 x103/uL       Imaging Studies:      DNR Status:  Full Code    Assessment/Plan:   Active Hospital Problems    Diagnosis    Benzodiazepine withdrawal (CMS HCC)   Anxiety disorder    depression  DJD  Headache  Fibromyalgia    Will admit in breakthru  DC that xanax  Start Klonopin  Psych consult  Will follow    DVT/PE Prophylaxis:  None  Disposition Planning:  Home    Merry Lofty, MD

## 2020-03-05 NOTE — Care Plan (Signed)
DISCHARGE PLAN:  Anie will follow up with her PCP, Read Drivers, PA on March 10, 2020 at 1pm and with Dr. Latina Craver, for psychiatry, on March 12, 2020 at 12:30pm.

## 2020-03-05 NOTE — Nurses Notes (Signed)
AM assessment completed per flow sheet. Alert and oriented x4. Affect appropriate. Lungs clear, but diminished bilaterally. Heart regular rate and rhythm, no murmur noted. Abdomen soft and non-tender, bowel sounds x4. Skin warm and dry. No problems with bowel or bladder. Radial pulses 2+. Dorsal ped and posterior tib pulses 2+. No peripheral edema noted as this time.     Patient expresses how glad she is to be here in the program and getting help for her medications. She is very appreciative of everyones help. No questions or complaints at this time. Personal items and call bell within reach, able to make needs known. Will continue to monitor.     Gerre Pebbles, RN

## 2020-03-05 NOTE — Behavioral Health (Addendum)
@DEPTNAMEADD @  @PBBORG @     Name: Elizabeth Hunter MRN:    Date: 03/04/2020 Age: 80 y.o.       History:          REASON FOR ADMISSION:  Benzodiazepine detox.      REASON FOR PSYCHIATRY CONSULTATION:  Psychiatry was consulted for recommendation regarding medication management and risk assessment.      C/O:  I think I am fine now because they have me on Klonopin and they will only take it away very very slowly.      HISTORY OF PRESENTING ILLNESS:  This patient has a history of always being a nervous person.  Some depressive episodes occurred which sounded rather minor during her life but a few years ago she was started on Celexa for a sustained depressive episode.  Prior to that there was no history of psychiatric treatment or counseling.  She has no history of psychiatric admission, no history of suicide attempt, no history of drug or alcohol abuse and no family history  of completed suicide.    Patient has followed with Dr. 05/02/2020 and his staff over the years for her depressive and medical issues.  Around 2-3 years ago she was started on p.r.n. Xanax for insomnia had 0.5 mg and began taking it routinely. 2021 was a difficult year for her physically due to some worsening of her fibromyalgia issues and due to gastrointestinal issues particularly constipation which were very upsetting to the patient.  Not surprisingly as her stress/anxiety issues increased and she began to increase her Xanax up to 0.5 mg twice per day and at times up to 3 times per day although that was rare.    Eventually the Xanax was only helping her fall asleep a few hours and then she was awakening during the night in a panic.  She presented initially to the emergency department on 03/03/2020 due to being unable to sleep, cope or function when she tried to taper the Xanax away on her own.  She was sent home from the emergency department and told to come back in withdrawal to start the [facility] program.  She began to feel less and less  able to cope or function and her mood symptoms worsened as well.     She then presented back yesterday evening and was admitted to the [facility] program and started on Klonopin 0.5 mg b.i.d. in addition to her Cymbalta 60 mg daily.  At the time of my assessment of her she is denying any withdrawal symptoms or panic attacks or issues.     Around 1 month ago she was transitioned to Cymbalta gradually from the Celexa 40 mg daily she has tolerated the transition well and actually prefers the Cymbalta as she says it helps with her depressive symptoms better and her pain modulation from her fibromyalgia.    No evidence of delirium or dementia.    Has decision making capacity as patient is able to communicate choices, understand relevant information, appreciate situation and its consequences including risk benefits side effects with ability to manipulate rationally.    LABS:  Labs were reviewed.      PAST PSYCHIATRY HISTORY:  No psychiatric history of admission.  Has never seen a psychiatrist.      MEDICAL/SURGICAL/ALLERGIES HISTORY:     Past Medical History:   Diagnosis Date    AAA (abdominal aortic aneurysm) (CMS HCC)     Back ache     Bowel trouble     obstruction/nonsurgical  Chronic headaches     Depression     Fibromyalgia     Osteoarthritis     Splenic artery aneurysm (CMS HCC)        Past Surgical History:   Procedure Laterality Date    HX APPENDECTOMY      HX CESAREAN SECTION      2    HX CHOLECYSTECTOMY      HX COLONOSCOPY      HX LAP CHOLECYSTECTOMY  2012    HX SINUS SURGERY  2014    HX WISDOM TEETH EXTRACTION         No Known Allergies      Current Facility-Administered Medications   Medication Dose Route Frequency Provider Last Rate Last Admin    acetaminophen (TYLENOL) tablet  650 mg Oral Q4H PRN Merry Lofty, MD        ascorbic acid (VITAMIN C) tablet  1,000 mg Oral Daily Merry Lofty, MD   1,000 mg at 03/05/20 1003    clonazePAM (KLONOPIN) tablet  0.5 mg Oral 2x/day Merry Lofty, MD   0.5 mg at 03/05/20 1003    DULoxetine (CYMBALTA) delayed release capsule  60 mg Oral Daily Merry Lofty, MD   60 mg at 03/05/20 1003    enoxaparin (LOVENOX) 40 mg/0.4 mL SubQ injection  40 mg Subcutaneous Q24H Merry Lofty, MD   40 mg at 03/04/20 1831    hydrOXYzine (ATARAX) tablet  25 mg Oral Q6H PRN Merry Lofty, MD        magnesium hydroxide (MILK OF MAGNESIA) 400mg  per 36mL oral liquid  15 mL Oral Daily PRN 4m, MD        meloxicam [facility]) tablet  15 mg Oral Daily [facility], MD   15 mg at 03/05/20 1003    ondansetron (ZOFRAN) 2 mg/mL injection  4 mg Intravenous Q6H PRN 05/03/20, MD        zinc amino acid chelate 50 mg tablet  50 mg Oral Daily Merry Lofty, MD   50 mg at 03/05/20 1002       Social history:  Married for over 55 years.  Originally from [place] and moved here at age 34. Has 2 children and both live in [place].  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  Has many friends and interests and activities.  Drives a car.    Family history:: Denies any family history of bipolar or schizophrenia.  Denies any history of addiction.    REVIEW OF SYSTEM:  10 point review of system was performed and agree with Hospital H&P        Examination     Blood pressure (!) 117/52, pulse 79, temperature 36.6 C (97.9 F), resp. rate (!) 22, height 1.549 m (5' 0.98"), weight 57.5 kg (126 lb 11.2 oz), SpO2 97 %.  General appearance: Appears stated age, dressed in street clothes.  Good eye contact, pleasant and cooperative.  Musculoskeletal: Normal gait and station. Normal muscle strength and tone.  Speech: Articulate, appropriate with spontaneous elaborations  Mood and Affect: Congruent with mood which is described at "fine".  Thought process: Logical, linear and goal directed.  Association: Intact  Abnormal thought/perceptional disturbances: None/denies hopelessness, death wishes or suicidal ideations.  Denies auditory or visual hallucinations or ideas of  reference.  Denies paranoid ideation.  Judgement and Insight:  Good.  Orientation: Alert and Oriented X 3  Recent and remote memory: Both Intact  Attention and concentration:  Good in both areas.  Language: Fluent use  of English.  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    Medical:  Benzodiazepine withdrawal-resolving.  Stressors:  Health issues, Xanax withdrawal symptoms.      ASSESSMENT AND PLAN:     - DOES NOT meet criteria for inpatient psychiatry hospitalization to ensure safety of self/others.  - DOES have capacity to make decisions  - Medications:  I would discharge her on Cymbalta 60 mg daily and Klonopin 0.5 mg p.o. b.i.d.Marland Kitchen  I will see her in my outpatient practice and will attempt a very slow taper of Klonopin on outpatient basis if possible/if she tolerates it.  I completely agree with substituting a longer-acting benzodiazepine for the Xanax and then tapering accordingly.  This woman should not at any point be given Xanax in the future.  - Further recommendations:  I will follow her in my outpatient practice.  I set up an outpatient appointment in my office with this patient on 03/12/2020, Wednesday at 1230.    Thank you for the consultation.      Latina Craver, MD    This note was partially created using voice recognition software and is inherently subject to errors including those of syntax and "sound-alike " substitutions which may escape proofreading.  In such instances, original meaning may be extrapolated by contextual derivation.      I spend 49 minutes with the patient/family with more than 50% of the time being spent on counseling and coordination of care.    CPT code: 86773

## 2020-03-05 NOTE — Telephone Encounter (Signed)
?   Message is not complete?

## 2020-03-05 NOTE — Care Plan (Signed)
Patient resting in bed. Alert and oriented x 4. Respirations are even and unlabored. On room air. No complaints of chest pain or SOB. Lungs are clear and diminished. Safety precautions maintained. Bed low, side rails up, wheels locked and call light within reach. Will continue to monitor.

## 2020-03-05 NOTE — Telephone Encounter (Signed)
Thank you! 03/05/20 tjm

## 2020-03-05 NOTE — Telephone Encounter (Signed)
I have spoke to [facility] about this one the phone she is going to contact the patient's pharmacy. The xanax is being d/c and the klonopin is being started per hospital discharge notes.

## 2020-03-07 LAB — DRUG MONITOR, BENZO, QN, URINE
ALPHAHYDROXYALPRAZOLAM: 610 ng/mL — ABNORMAL HIGH (ref <25)
ALPHAHYDROXYMIDAZOLAM: NEGATIVE ng/mL (ref <50)
ALPHAHYDROXYTRIAZOLAM: NEGATIVE ng/mL (ref <50)
AMINOCLONAZEPAM: NEGATIVE ng/mL (ref <25)
HYDROXYETHYLFLURAZEPAM: NEGATIVE ng/mL (ref <50)
LORAZEPAM: NEGATIVE ng/mL (ref <50)
NORDIAZEPAM: NEGATIVE ng/mL (ref <50)
OXAZEPAM: NEGATIVE ng/mL (ref <50)
TEMAZEPAM: NEGATIVE ng/mL (ref <50)

## 2020-03-07 LAB — NOTES AND COMMENTS

## 2020-03-10 ENCOUNTER — Encounter (INDEPENDENT_AMBULATORY_CARE_PROVIDER_SITE_OTHER): Payer: Self-pay | Admitting: Physician Assistant

## 2020-03-11 ENCOUNTER — Encounter (INDEPENDENT_AMBULATORY_CARE_PROVIDER_SITE_OTHER): Payer: Self-pay | Admitting: VASCULAR SURGERY

## 2020-03-11 ENCOUNTER — Other Ambulatory Visit (INDEPENDENT_AMBULATORY_CARE_PROVIDER_SITE_OTHER): Payer: HMO

## 2020-03-11 ENCOUNTER — Ambulatory Visit (INDEPENDENT_AMBULATORY_CARE_PROVIDER_SITE_OTHER): Payer: HMO | Admitting: VASCULAR SURGERY

## 2020-03-11 ENCOUNTER — Other Ambulatory Visit: Payer: Self-pay

## 2020-03-11 ENCOUNTER — Ambulatory Visit (INDEPENDENT_AMBULATORY_CARE_PROVIDER_SITE_OTHER): Payer: HMO

## 2020-03-11 ENCOUNTER — Inpatient Hospital Stay (INDEPENDENT_AMBULATORY_CARE_PROVIDER_SITE_OTHER)
Admission: RE | Admit: 2020-03-11 | Discharge: 2020-03-11 | Disposition: A | Discharge status: Home / Self Care | Payer: HMO | Source: Ambulatory Visit

## 2020-03-11 VITALS — BP 136/81 | HR 85 | Ht 61.0 in | Wt 129.8 lb

## 2020-03-11 DIAGNOSIS — I779 Disorder of arteries and arterioles, unspecified: Secondary | ICD-10-CM

## 2020-03-11 DIAGNOSIS — I728 Aneurysm of other specified arteries: Secondary | ICD-10-CM

## 2020-03-11 DIAGNOSIS — I714 Abdominal aortic aneurysm, without rupture, unspecified: Secondary | ICD-10-CM

## 2020-03-11 LAB — ECG 12-LEAD
Atrial Rate: 83 {beats}/min
Calculated P Axis: 49 degrees
Calculated R Axis: 11 degrees
Calculated T Axis: 38 degrees
PR Interval: 140 ms
QRS Duration: 82 ms
QT Interval: 372 ms
QTC Calculation: 437 ms
Ventricular rate: 83 {beats}/min

## 2020-03-11 NOTE — Progress Notes (Signed)
Department of Vascular Surgery  [facility]  Outpatient Clinic Progress Note    Date: 03/11/2020  Patient: Elizabeth Hunter  DOB:   PCP: Cecilie Kicks, DO    Chief Complaint:  Carotid stenosis, splenic artery aneurysm  Chief Complaint   Patient presents with    Follow Up     7 month f/u   Small AAA stable  Small splenic aneurysm  Left carotid bruit  Carotid artery duplex 03/11/2020       Subjective:     HPI: Elizabeth Hunter is a 80 y.o. White female who presents for follow-up of carotid stenosis after having an audible left carotid bruit.  She had a duplex performed approximately 6 months ago revealed less than 50% stenosis.  She CTA after having a 6 month follow-up performed.  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  She had a KUB x-ray performed today that revealed stability.    She has had no focal neurologic such as unilateral weakness, expressive aphasia, facial droop or visual loss.    The patient denies any new changes in PMHx, PSxH, Social Hx, FHx or current medications.     Review of Systems:  Constitutional: negative for fevers, chills, sweats and fatigue  Eyes: negative for blurred vision or field defects  HEENT: negative for hearing loss  Respiratory: negative for cough and shortness of breath  Cardiovascular: negative for chest pain, negative for claudication  Gastrointestinal: negative for post prandial pain  Genitourinary: negative for dysuria, continues to urinate  Musculoskeletal: As in HPI  Neurological: negative for unilateral weakness or monocular blindness  Integumentary: negative for wounds or ulcers    Objective:    PHYSICAL EXAM:  Vitals: BP 136/81 (Site: Right, Patient Position: Sitting) Comment (Site): 141/66 left arm  Pulse 85   Ht 1.549 m (5\' 1" )   Wt 58.9 kg (129 lb 13.6 oz)   SpO2 97%   BMI 24.53 kg/m       General: AA&O X3. Nontoxic.  Well developed and well nourished in no acute distress   HENT: Head is normocephalic, atraumatic   Eyes: Pupils  equal and round and reactive to light; conjunctiva clear. Sclera without icterus; EOMO grossly intact.    Neck: Normal ROM, Supple, symmetrical  Lungs: Effort normal, clear to auscultation bilaterally.   Cardiovascular: Heart regular rate and rhythm, S1, S2 normal, no murmur, click, rub or gallop  Vascular:  Left carotid bruit audible, no bruit on the right   Left brachial artery:  2+ (normal),  Right brachial artery:  2+ (normal),    Left radial artery:  2+ (normal),  Right radial artery:  2+ (normal),      Abdomen: Bowel sounds normal; soft, non distended non-tender to palpation, no rebound or guarding present. No palpable masses.  Extremities: no cyanosis or edema  Skin:  Skin warm and dry      DATA:     I have independently reviewed and interpreted available imaging.  My interpretations are summarized below.      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  We will arrange for an annual carotid duplex well as KUB in 1  year.    The patient was given the opportunity to ask questions and those questions were answered to their satisfaction. Instructed to call with any further questions or concerns.     This patient was seen independently. The case and care was discussed with Dr. Andria Rhein.    Gerilyn Nestle, PA-C  03/11/2020, 16:13    I have discussed the case with Gerilyn Nestle and have reviewed her note.  I concur with the plans.    Jabier Gauss, MD  03/13/2020, 10:23

## 2020-03-12 ENCOUNTER — Ambulatory Visit (INDEPENDENT_AMBULATORY_CARE_PROVIDER_SITE_OTHER): Payer: HMO | Admitting: Physician Assistant

## 2020-03-12 ENCOUNTER — Ambulatory Visit (INDEPENDENT_AMBULATORY_CARE_PROVIDER_SITE_OTHER): Payer: HMO | Admitting: PSYCHIATRY

## 2020-03-12 ENCOUNTER — Encounter (INDEPENDENT_AMBULATORY_CARE_PROVIDER_SITE_OTHER): Payer: Self-pay | Admitting: Physician Assistant

## 2020-03-12 ENCOUNTER — Encounter (INDEPENDENT_AMBULATORY_CARE_PROVIDER_SITE_OTHER): Payer: Self-pay | Admitting: PSYCHIATRY

## 2020-03-12 VITALS — BP 132/68 | HR 83 | Temp 98.5°F | Ht 61.0 in | Wt 131.2 lb

## 2020-03-12 VITALS — BP 142/74 | HR 85 | Resp 16 | Ht 61.0 in | Wt 125.0 lb

## 2020-03-12 DIAGNOSIS — F410 Panic disorder [episodic paroxysmal anxiety] without agoraphobia: Secondary | ICD-10-CM

## 2020-03-12 DIAGNOSIS — F411 Generalized anxiety disorder: Secondary | ICD-10-CM

## 2020-03-12 DIAGNOSIS — F320 Major depressive disorder, single episode, mild: Secondary | ICD-10-CM

## 2020-03-12 DIAGNOSIS — F13939 Sedative, hypnotic or anxiolytic use, unspecified with withdrawal, unspecified: Secondary | ICD-10-CM

## 2020-03-12 DIAGNOSIS — F13239 Sedative, hypnotic or anxiolytic dependence with withdrawal, unspecified: Secondary | ICD-10-CM

## 2020-03-12 DIAGNOSIS — G4700 Insomnia, unspecified: Secondary | ICD-10-CM

## 2020-03-12 DIAGNOSIS — F3341 Major depressive disorder, recurrent, in partial remission: Secondary | ICD-10-CM

## 2020-03-12 MED ORDER — CLONAZEPAM 0.5 MG TABLET
ORAL_TABLET | ORAL | 1 refills | Status: DC
Start: 2020-03-12 — End: 2020-04-21

## 2020-03-12 MED ORDER — DULOXETINE 60 MG CAPSULE,DELAYED RELEASE
60.0000 mg | DELAYED_RELEASE_CAPSULE | Freq: Every day | ORAL | 2 refills | Status: DC
Start: 2020-03-12 — End: 2020-04-21

## 2020-03-12 NOTE — Nursing Note (Signed)
03/12/20 1217   PHQ 9 (follow up)   Little interest or pleasure in doing things. 0   Feeling down, depressed, or hopeless 0   PHQ 2 Total 0   Trouble falling or staying asleep, or sleeping too much. 0   Feeling tired or having little energy 3   Poor appetite or overeating 0   Feeling bad about yourself/ that you are a failure in the past 2 weeks? 0   Trouble concentrating on things in the past 2 weeks? 1   Moving/Speaking slowly or being fidgety or restless  in the past 2 weeks? 1   Thoughts that you would be better off DEAD, or of hurting yourself in some way. 0   If you checked off any problems, how difficult have these problems made it for you to do your work, take care of things at home, or get along with other people? Not difficult at all   PHQ 9 Total 5   Interpretation of Total Score Mild depression

## 2020-03-12 NOTE — Progress Notes (Signed)
FAMILY MEDICINE, [facility]  [address]  [place] [address]       Name: Elizabeth Hunter  MRN:  [id number]       Date: 03/12/2020  DOB:      Age: 80 y.o.             Reason for Visit: Anxiety    History of Present Illness  Elizabeth Hunter is a 80 y.o. female who is being seen today for hospital f/u. States she is doing better today. States it has been about 1 week since discharged from [facility]. States she spent one evening there. Reports she is under the care now of Dr. Corena Herter. States she was taken off the xanax and placed on clonazepam .5mg  twice daily. States today she did have f/u appointment and they are going to decrease her morning dose of clonazepam from .5mg  to 1/2 tablet to 0.25mg  and continue with 0.5mg  in the evening. Pt states she feels the Cymbalta is helping some now also, denies depression. States she does have a f/u appointment with Dr. in March. She reports she doesn't have a current appointment with Dr. Corena Herter (therapist) but plans to make an appointment.     Past Medical History:   Diagnosis Date   . AAA (abdominal aortic aneurysm) (CMS HCC)    . Back ache    . Bowel trouble     obstruction/nonsurgical   . Chronic headaches    . Depression    . Fibromyalgia    . Osteoarthritis    . Splenic artery aneurysm (CMS [facility])          Past Surgical History:   Procedure Laterality Date   . HX APPENDECTOMY     . HX CESAREAN SECTION      2   . HX CHOLECYSTECTOMY     . HX COLONOSCOPY     . HX LAP CHOLECYSTECTOMY  2012   . HX SINUS SURGERY  2014   . HX WISDOM TEETH EXTRACTION           Current Outpatient Medications   Medication Sig   . clonazePAM (KLONOPIN) 0.5 mg Oral Tablet One-half tab po qam and 1 tab po qhs. Indications: panic disorder   . DULoxetine (CYMBALTA DR) 60 mg Oral Capsule, Delayed Release(E.C.) Take 1 Capsule (60 mg total) by mouth Once a day Indications: major depressive disorder   . meloxicam (MOBIC) 15 mg Oral Tablet Take 1 Tablet (15 mg  total) by mouth Once a day     No Known Allergies  Family Medical History:     Problem Relation (Age of Onset)    Heart Attack Father (44)    Hypertension (High Blood Pressure) Mother (37)          Social History     Tobacco Use   . Smoking status: Never Smoker   . Smokeless tobacco: Never Used   Vaping Use   . Vaping Use: Never used   Substance Use Topics   . Alcohol use: No   . Drug use: Yes     Types: Benzodiazepines       Nursing Notes  There are no exam notes on file for this visit.     Review of Systems  Review of Systems   Constitutional: Positive for malaise/fatigue. Negative for chills, fever and weight loss.        Feels the clonazepam makes her feel more fatigue and groggy then the xanax did. Working on  tapering the clonazepam    HENT: Negative for congestion, ear discharge, ear pain, hearing loss, nosebleeds, sinus pain and sore throat.    Eyes: Negative for blurred vision, photophobia and discharge.   Respiratory: Negative for cough, hemoptysis, sputum production, shortness of breath and wheezing.    Cardiovascular: Negative for chest pain, palpitations and leg swelling.   Gastrointestinal: Negative for abdominal pain, blood in stool, constipation, diarrhea, heartburn, nausea and vomiting.   Genitourinary: Negative for dysuria, frequency, hematuria and urgency.   Musculoskeletal: Negative for back pain, falls, joint pain, myalgias and neck pain.   Skin: Negative for itching and rash.   Neurological: Negative for dizziness, tingling, tremors, weakness and headaches.   Endo/Heme/Allergies: Negative for environmental allergies. Does not bruise/bleed easily.   Psychiatric/Behavioral: Negative for depression, memory loss, substance abuse and suicidal ideas. The patient is nervous/anxious and has insomnia.         Sleeping better at this time  Still anxious, fearful about decreasing the clonazepam due to having withdrawal symptoms from xanax when trying to decrease the dose at home.        Physical Exam:  BP  132/68 (Site: Left, Patient Position: Sitting, Cuff Size: Adult)   Pulse 83   Temp 36.9 C (98.5 F) (Oral)   Ht 1.549 m (5\' 1" )   Wt 59.5 kg (131 lb 3.2 oz)   SpO2 99%   BMI 24.79 kg/m       Physical Exam  Vitals and nursing note reviewed.   Constitutional:       General: She is not in acute distress.Vital signs are normal.      Appearance: She is not diaphoretic.   HENT:      Head: Normocephalic and atraumatic.   Eyes:      Extraocular Movements: EOM normal.      Conjunctiva/sclera: Conjunctivae normal.      Pupils: Pupils are equal, round, and reactive to light.   Pulmonary:      Effort: Pulmonary effort is normal. No respiratory distress.   Musculoskeletal:         General: Normal range of motion.   Skin:     General: Skin is warm and dry.   Neurological:      Mental Status: She is alert and oriented to person, place, and time.      Coordination: Coordination normal.      Gait: Gait is intact.   Psychiatric:         Mood and Affect: Mood and affect normal.         Behavior: Behavior normal.         Thought Content: Thought content normal.         Cognition and Memory: Memory normal.         Judgment: Judgment normal.         Assessment and Plan    ENCOUNTER DIAGNOSES     ICD-10-CM   1. Generalized anxiety disorder with panic attacks  F41.1    F41.0   2. 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 Insomnia, unspecified type  G47.00        On the day of the encounter, a total of  30 minutes was spent on this patient encounter including review of historical information, examination, documentation and post-visit activities.     Continue with treatment plan from psychiatrist   Make f/u appointment with psychologist   Stay active  Get back to the gym  Balanced diet  F/u in about 6 weeks     Follow up: Return in about 6 weeks (around 04/23/2020), or if symptoms worsen or fail to improve.      This patient was seen independently.    Read Drivers, PA-C  03/12/2020,  14:59

## 2020-03-12 NOTE — Nursing Note (Signed)
03/12/20 1220   [place] Suicide Severity Rating Scale (Since Last Contact Screener)   1. Wish to be Dead (Since Last Contact) N   2. Non-Specific Active Suicidal Thoughts (Since Last Contact) N   6. Suicidal Behavior (Since Last Contact) N   Calculated C-SSRS Risk Score (Since Last Contact) No Risk Indicated

## 2020-03-12 NOTE — Progress Notes (Addendum)
BEHAVIORAL MEDICINE, [facility]   [address]  [place] [address]       Name: Elizabeth Hunter MRN:  [id number]   Date: 03/12/2020 Age: 80 y.o.       History:        Last visit: 03/05/2020 on medical floor in consultation.    Interval History:  Has been "a bit groggy" on klonopin 0.5mg  po bid and thinks I need to start tapering it. Overall mood is "good" and fair energy and exercises daily.    Still bothered by GAD sx and are chronic but says she needs help with that. I told her to return to the [facility] for therapy and she agreed to do so.  Family remains supportive.  She feels unsafe to drive due to her drowsiness.     No significant depressive symptoms.  Cymbalta is working well in that area.  Denies hopelessness, death wishes or suicidal ideations.  Denies worthlessness.  Is sleeping well actually.  Improving appetite.  Constipation has resolved.     No benzodiazepine withdrawal symptoms reported.  Said she is very fearful of having them again however.  Basically she had some difficulty understanding why I was beginning the taper of Klonopin although I had told her directly in the hospital I would do so on the outpatient basis.  She also has balance and gait issues and is at high risk to fall on her current dose of Klonopin.  She finally agreed that it should be tapered but seemed somewhat disappointed.      Substance use: Denies any substance use.    Review of systems:   Constitutional: Denies any medication side effect.  GI: Denies any diarrhea, constipation.  Dermatological:  Denies any skin rash    Medical and Surgical history in the interim: Non-contributory.  Past Medical History:   Diagnosis Date   . AAA (abdominal aortic aneurysm) (CMS HCC)    . Back ache    . Bowel trouble     obstruction/nonsurgical   . Chronic headaches    . Depression    . Fibromyalgia    . Osteoarthritis    . Splenic artery aneurysm (CMS [facility])        Past Surgical History:   Procedure  Laterality Date   . HX APPENDECTOMY     . HX CESAREAN SECTION      2   . HX CHOLECYSTECTOMY     . HX COLONOSCOPY     . HX LAP CHOLECYSTECTOMY  2012   . HX SINUS SURGERY  2014   . HX WISDOM TEETH EXTRACTION         Current Outpatient Medications   Medication Sig Dispense Refill   . clonazePAM (KLONOPIN) 0.5 mg Oral Tablet One-half tab po qam and 1 tab po qhs. Indications: panic disorder 45 Tablet 1   . DULoxetine (CYMBALTA DR) 60 mg Oral Capsule, Delayed Release(E.C.) Take 1 Capsule (60 mg total) by mouth Once a day Indications: major depressive disorder 30 Capsule 2   . meloxicam (MOBIC) 15 mg Oral Tablet Take 1 Tablet (15 mg total) by mouth Once a day 90 Tablet 3     No current facility-administered medications for this visit.        Allergies: No Known Allergies    Exam:      BP (!) 142/74   Pulse 85   Resp 16   Ht 1.549 m (5\' 1" )   Wt 56.7 kg (  125 lb)   SpO2 97%   BMI 23.62 kg/m       General appearance: Well dressed, well groomed.  Initially quite pleasant and cooperative and then ruminative as above.  Musculoskeletal: Normal gait and station. Normal muscle strength and tone.  Speech: Articulate, appropriate with spontaneous elaborations  Affect: Congruent with mood which is described as "Still just not right".  Thought process: Logical, linear and goal directed.  Association: Intact  Abnormal thought: None  /Denies suicidal or homicidal ideations or psychotic symptoms.  Denies violent thoughts.  Judgement and Insight: Partial  Attention and concentration: Adequate  Language: Fluent use of English.    Assessment: Diagnosis: unchanged.    Overall I believe her depressive symptoms are at a very low level and she is improving overall.  Ruminative nature leads to GED symptoms and she needs to go to therapy to finish working on those.     Substance use is not a concern. No legal issues. No acute suicide risk. Discussed status and reviewed options for therapy. Discussed medications, psychotherapy and  structures/schedule of program. Reviewed plan, goals, rationale, risks, benefits, and alternatives. Patient indicates understanding and agreement. Has decision making capacity.        ICD-10-CM    1. Recurrent major depressive disorder, in partial remission (CMS HCC)  F33.41    2. Generalized anxiety disorder  F41.1    3. 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 and 0.5 mg at HS.  Continue Cymbalta 60 mg daily.  Therapy:  Continue current as above.  RTC:   Six weeks.  Labs/test/imagining: None  I spent over 25 minutes on this case today with the patient present over 66% of the time and we discussed her issues, medications, potential medication side effects and I obtained informed consent.  Over 50% of her time was spent on counseling, supportive psychotherapy and patient Education.    Latina Craver, MD      This note was partially created using voice recognition software and is inherently subject to errors including those of syntax and "sound-alike" substitutions which may escape proofreading.  In such instances, original meaning may be extrapolated by contextual derivation.

## 2020-03-17 ENCOUNTER — Telehealth (INDEPENDENT_AMBULATORY_CARE_PROVIDER_SITE_OTHER): Payer: Self-pay | Admitting: PSYCHIATRY

## 2020-03-17 NOTE — Discharge Summary (Signed)
[facility]  DISCHARGE SUMMARY      PATIENT NAME:  Elizabeth Hunter, Elizabeth Hunter  MRN:  [id number]  DOB:      ENCOUNTER DATE:  03/04/2020  INPATIENT ADMISSION DATE: 03/04/2020  DISCHARGE DATE:  03/05/2020    ATTENDING PHYSICIAN: No att. providers found  SERVICE: [id number]  PRIMARY CARE PHYSICIAN: Cecilie Kicks, DO     Reason for Admission     Diagnosis    Benzodiazepine withdrawal (CMS [facility]) [3818299]          DISCHARGE DIAGNOSIS:     Principal Problem:      Active Hospital Problems    Diagnosis Date Noted   . Benzodiazepine withdrawal (CMS [facility]) [F13.239] 03/04/2020      Resolved Hospital Problems   No resolved problems to display.     Active Non-Hospital Problems    Diagnosis Date Noted   . Insomnia 05/24/2019   . Hematuria 05/24/2019   . Glucosuria 05/24/2019   . Fatigue 05/24/2019   . Moderate mixed hyperlipidemia not requiring statin therapy 05/24/2019   . DDD (degenerative disc disease), lumbar 01/06/2019   . Generalized anxiety disorder with panic attacks 09/08/2018   . Mild major depression (CMS HCC) 09/08/2018   . Hip pain 07/25/2013   . Sacroiliac pain 07/25/2013      No Known Allergies         DISCHARGE MEDICATIONS:     Current Discharge Medication List      CONTINUE these medications - NO CHANGES were made during your visit.      Details   meloxicam 15 mg Tablet  Commonly known as: MOBIC   15 mg, Oral, DAILY  Qty: 90 Tablet  Refills: 3        STOP taking these medications.    ALPRAZolam 0.5 mg Tablet  Commonly known as: XANAX     ascorbic acid (vitamin C) 250 mg Tablet  Commonly known as: VITAMIN C     Herbal Drugs Capsule     lubiprostone 8 mcg Capsule  Commonly known as: Amitiza     PROBIOTIC-10 ORAL     traZODone 50 mg Tablet  Commonly known as: DESYREL     vitamin E 100 unit Capsule     vitamin E 400 unit Capsule          Discharge med list refreshed?  YES        DISCHARGE INSTRUCTIONS:    No discharge procedures on file.       REASON FOR HOSPITALIZATION AND HOSPITAL COURSE:  This is a 80 y.o.,  female with benzodiazepine withdrawal  Patient who has started taking Xanax to sleep  Work really good then she started taking it more often and it just continued that she has to take a medicine or she started developing tachycardia palpitation anxiety  Possible mini withdrawal and when she will take the medicine it will go way  Patient was admitted stitch swish from Xanax to Klonopin psychiatric evaluation was done  Patient will be followed by psychiatric reflux gradual reduction in the dose patient is agreeable to that  Patient at this time is not in any distress  Patient is awake alert and would be discharged home patient husband is going to come and take her  No suicidal or homicidal ideation  CONDITION ON DISCHARGE:  A. Ambulation: Full ambulation  B. Self-care Ability: Complete  C. Cognitive Status Oriented x 3  D. DNR status at discharge: Full Code    Advance Directive Information  Flowsheet Row Most Recent Value   Does the Patient have an Advance Directive? No, Information Offered and Refused          DISCHARGE DISPOSITION:  Home discharge              Merry Lofty, MD      Copies sent to Care Team       Relationship Specialty Notifications Start End    Cecilie Kicks,  PCP - General FAMILY MEDICINE  11/29/17     Phone: [phone] Fax: [phone]         [address] [place] [facility] [address]          Referring providers can utilize [email or web address] to access their referred [facility] patient's information.

## 2020-03-17 NOTE — Telephone Encounter (Signed)
Pt calls stating that she is not sure how the klonopin is supposed to make feel? States that she is having a pretty time with this medication and is still shaky all the time. Feels tired and states that her legs are weak. Wants to know if these symptoms will resolve?

## 2020-03-17 NOTE — Telephone Encounter (Signed)
Informed pt .

## 2020-03-24 ENCOUNTER — Encounter (INDEPENDENT_AMBULATORY_CARE_PROVIDER_SITE_OTHER): Payer: Self-pay

## 2020-03-31 ENCOUNTER — Encounter (INDEPENDENT_AMBULATORY_CARE_PROVIDER_SITE_OTHER): Payer: Self-pay | Admitting: Physician Assistant

## 2020-04-02 ENCOUNTER — Telehealth (INDEPENDENT_AMBULATORY_CARE_PROVIDER_SITE_OTHER): Payer: Self-pay | Admitting: PSYCHIATRY

## 2020-04-02 NOTE — Telephone Encounter (Addendum)
Disregard message, you did not prescribe this to her.Elizabeth Hunter needs refill on clonazepam

## 2020-04-07 ENCOUNTER — Other Ambulatory Visit (INDEPENDENT_AMBULATORY_CARE_PROVIDER_SITE_OTHER): Payer: Self-pay | Admitting: PSYCHIATRY

## 2020-04-07 ENCOUNTER — Other Ambulatory Visit (INDEPENDENT_AMBULATORY_CARE_PROVIDER_SITE_OTHER): Payer: Self-pay | Admitting: Internal Medicine

## 2020-04-07 NOTE — Telephone Encounter (Signed)
Pt called asking if there is something other than Klonopin she can take, states that she has had an increased heart rate, shakiness, and is falling at times. States that she know she initially was put on it to replace to the xanax and help with the side effect from withdrawal, she wants to know why she is still taking it stating "shouldn't all that be done now?"

## 2020-04-07 NOTE — Telephone Encounter (Signed)
Informed pt .

## 2020-04-07 NOTE — Telephone Encounter (Signed)
Decreased Klonopin order 0.25mg  BID

## 2020-04-09 NOTE — Telephone Encounter (Signed)
Pt called requesting new order be sent into pharmacy

## 2020-04-10 ENCOUNTER — Other Ambulatory Visit (INDEPENDENT_AMBULATORY_CARE_PROVIDER_SITE_OTHER): Payer: Self-pay | Admitting: Internal Medicine

## 2020-04-14 ENCOUNTER — Encounter (INDEPENDENT_AMBULATORY_CARE_PROVIDER_SITE_OTHER): Payer: Self-pay | Admitting: Nurse Practitioner

## 2020-04-14 ENCOUNTER — Other Ambulatory Visit: Payer: Self-pay

## 2020-04-14 ENCOUNTER — Ambulatory Visit (INDEPENDENT_AMBULATORY_CARE_PROVIDER_SITE_OTHER): Payer: HMO

## 2020-04-14 ENCOUNTER — Encounter (INDEPENDENT_AMBULATORY_CARE_PROVIDER_SITE_OTHER): Payer: Self-pay

## 2020-04-14 VITALS — Ht 61.0 in | Wt 125.0 lb

## 2020-04-14 DIAGNOSIS — R150 Incomplete defecation: Secondary | ICD-10-CM

## 2020-04-14 DIAGNOSIS — K5900 Constipation, unspecified: Secondary | ICD-10-CM

## 2020-04-14 DIAGNOSIS — N816 Rectocele: Secondary | ICD-10-CM

## 2020-04-14 NOTE — Procedures (Signed)
COLORECTAL SURGERY, [facility]  [address]  [place] [address]    Procedure Note    Name: Elizabeth Hunter MRN:  [id number]   Date: 04/14/2020 Age: 80 y.o.       BIOFEEDBACK TRAINING, PERINEAL MUSCLES, ANORECTAL OR URETHRAL SPHINCTER, INCLUDING EMG AND/OR MANOMETERY; UP TO 60 MINUTES (AMB ONLY)    Date/Time: 04/14/2020 2:45 PM  Performed by: Earl Lites, PhD  Authorized by: Burman Nieves, MD     How many minutes has this been performed?:  30   Patient returns to the [facility] physiology lab reporting having regular BM just to many of them.  We discussed her adding fiber into her routine and she agreed to try,.  We conc with retraining with compliance challenging.  Patient tolerated procedure well.  We ill f/u 2-3 weeks and she is to cont her home program.      Lorna Dibble, PhD          Earl Lites, PhD

## 2020-04-16 ENCOUNTER — Other Ambulatory Visit: Payer: Self-pay

## 2020-04-16 ENCOUNTER — Ambulatory Visit (INDEPENDENT_AMBULATORY_CARE_PROVIDER_SITE_OTHER): Payer: HMO | Admitting: Physician Assistant

## 2020-04-16 ENCOUNTER — Encounter (INDEPENDENT_AMBULATORY_CARE_PROVIDER_SITE_OTHER): Payer: Self-pay | Admitting: Physician Assistant

## 2020-04-16 VITALS — BP 118/68 | HR 108 | Temp 98.0°F | Resp 12 | Ht 61.0 in | Wt 130.8 lb

## 2020-04-16 DIAGNOSIS — F411 Generalized anxiety disorder: Secondary | ICD-10-CM

## 2020-04-16 DIAGNOSIS — F410 Panic disorder [episodic paroxysmal anxiety] without agoraphobia: Secondary | ICD-10-CM

## 2020-04-16 DIAGNOSIS — R002 Palpitations: Secondary | ICD-10-CM

## 2020-04-16 DIAGNOSIS — F320 Major depressive disorder, single episode, mild: Secondary | ICD-10-CM

## 2020-04-16 NOTE — Progress Notes (Signed)
FAMILY MEDICINE, [facility]  [address]  [place] [address]       Name: Elizabeth Hunter  MRN:  [id number]       Date: 04/16/2020  DOB:      Age: 80 y.o.             Reason for Visit: Follow Up (depression)    History of Present Illness  Elizabeth Hunter is a 80 y.o. female who is being seen today for depression and anxiety. States she does have up coming appointment with psychiatrist on Monday 04/21/20. States she has been able to taper the clonazepam 0.5mg  to 1/2 tablet twice daily. Pt states her anxiety and depression do not feel controlled. States she is tearful most days. States she wakes up feeling anxious. States she continues to have an internal shaking. States she does have excessive sweating in the mornings which is usually what wakes her up. states she has dread of getting out of her bed. States she feels like she doesn't know what her purpose is right now. States this has been the toughest year of her life. Her and her husband both recently had covid-19. Her husband continues to be ill from covid-19 with lung effusions and infections. Pt denies SI. Pt denies HI. States she just doesn't know what she is supposed to do to help make herself feel better. Pt is currently taking Cymbalta 60mg  daily. Clonazepam 0.5mg  1/2 tablet twice daily and Mobic 15mg  once daily. States she feels like she is eating better at this time. States her bowel movements are better and still attending therapy with Dr. and under Dr. care.     Past Medical History:   Diagnosis Date    AAA (abdominal aortic aneurysm) (CMS HCC)     Back ache     Bowel trouble     obstruction/nonsurgical    Chronic headaches     Constipation     Depression     Fibromyalgia     Incomplete defecation     Osteoarthritis     Rectocele     Splenic artery aneurysm (CMS HCC)          Past Surgical History:   Procedure Laterality Date    HX APPENDECTOMY      HX CESAREAN SECTION      2    HX  CHOLECYSTECTOMY      HX COLONOSCOPY      HX LAP CHOLECYSTECTOMY  2012    HX SINUS SURGERY  2014    HX WISDOM TEETH EXTRACTION           Current Outpatient Medications   Medication Sig    clonazePAM (KLONOPIN) 0.5 mg Oral Tablet One-half tab po qam and 1 tab po qhs. Indications: panic disorder    DULoxetine (CYMBALTA DR) 60 mg Oral Capsule, Delayed Release(E.C.) Take 1 Capsule (60 mg total) by mouth Once a day Indications: major depressive disorder    meloxicam (MOBIC) 15 mg Oral Tablet Take 1 Tablet (15 mg total) by mouth Once a day     No Known Allergies  Family Medical History:     Problem Relation (Age of Onset)    Heart Attack Father (65)    Hypertension (High Blood Pressure) Mother (73)          Social History     Tobacco Use    Smoking status: Never Smoker    Smokeless tobacco: Never Used   (60  Vaping Use: Never used   Substance Use Topics    Alcohol use: No    Drug use: Yes     Types: Benzodiazepines       Nursing Notes  There are no exam notes on file for this visit.     Review of Systems  Review of Systems   Constitutional: Positive for diaphoresis. Negative for chills, fever, malaise/fatigue and weight loss.   HENT: Negative for congestion, ear discharge, ear pain, hearing loss, nosebleeds, sinus pain and sore throat.    Eyes: Negative for blurred vision, photophobia and discharge.   Respiratory: Negative for cough, hemoptysis, sputum production, shortness of breath and wheezing.    Cardiovascular: Positive for palpitations. Negative for chest pain and leg swelling.   Gastrointestinal: Negative for abdominal pain, blood in stool, constipation, diarrhea, heartburn, nausea and vomiting.   Genitourinary: Negative for dysuria, frequency, hematuria and urgency.   Musculoskeletal: Negative for back pain, falls, joint pain, myalgias and neck pain.   Skin: Negative for itching and rash.   Neurological: Negative for dizziness, tingling, tremors, weakness and headaches.   Endo/Heme/Allergies:  Negative for environmental allergies. Does not bruise/bleed easily.   Psychiatric/Behavioral: Positive for depression. Negative for memory loss, substance abuse and suicidal ideas. The patient is nervous/anxious. The patient does not have insomnia.        Physical Exam:  BP 118/68    Pulse (!) 108    Temp 36.7 C (98 F)    Resp 12    Ht 1.549 m (5\' 1" )    Wt 59.3 kg (130 lb 12.8 oz)    SpO2 98%    BMI 24.71 kg/m       Physical Exam  Vitals and nursing note reviewed.   Constitutional:       General: She is not in acute distress.     Appearance: She is not diaphoretic.   HENT:      Head: Normocephalic and atraumatic.      Mouth/Throat:      Mouth: Mucous membranes are moist.      Pharynx: Oropharynx is clear.   Eyes:      Conjunctiva/sclera: Conjunctivae normal.      Pupils: Pupils are equal, round, and reactive to light.   Cardiovascular:      Rate and Rhythm: Normal rate and regular rhythm.      Heart sounds: Normal heart sounds.   Pulmonary:      Effort: Pulmonary effort is normal.      Breath sounds: Normal breath sounds.   Musculoskeletal:         General: Normal range of motion.   Skin:     General: Skin is warm and dry.   Neurological:      Mental Status: She is alert and oriented to person, place, and time.      Coordination: Coordination normal.      Gait: Gait is intact.   Psychiatric:         Mood and Affect: Mood and affect normal.         Cognition and Memory: Memory normal.         Judgment: Judgment normal.      Comments: Tearful at times   Lack of concentration          Assessment and Plan    ENCOUNTER DIAGNOSES     ICD-10-CM   1. Generalized anxiety disorder with panic attacks  F41.1    F41.0   2. 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 Palpitations  R00.2      On the day of the encounter, a total of  30  minutes was spent on this patient encounter including review of historical information, examination, documentation and post-visit activities.     Pt to keep up coming appointment with  psychiatry  No medication adjustments were made at today's office visit  We did discuss possible increase in Cymbalta or addition of medication such as Buspar if clonazepam is discontinued. Pt will discuss with him on Monday. Pt understands we want Dr. Sheila Oats professional opinion and advice with her medication management. She understands he will come up with medication treatment plan for mental health.     Pt to call and make appointment with therapist  States she hasn't saw her in about 6 weeks       We will order 30 day event monitor due to her symptoms of palpitations.     Orders Placed This Encounter    30 DAY EVENT MONITOR     Follow up: Return in about 4 weeks (around 05/14/2020), or if symptoms worsen or fail to improve.      This patient was seen independently.    Read Drivers, PA-C  04/16/2020, 15:13

## 2020-04-17 ENCOUNTER — Encounter (INDEPENDENT_AMBULATORY_CARE_PROVIDER_SITE_OTHER): Payer: Self-pay | Admitting: Physician Assistant

## 2020-04-21 ENCOUNTER — Other Ambulatory Visit: Payer: Self-pay

## 2020-04-21 ENCOUNTER — Ambulatory Visit (INDEPENDENT_AMBULATORY_CARE_PROVIDER_SITE_OTHER): Payer: HMO | Admitting: PSYCHIATRY

## 2020-04-21 ENCOUNTER — Encounter (INDEPENDENT_AMBULATORY_CARE_PROVIDER_SITE_OTHER): Payer: Self-pay | Admitting: PSYCHIATRY

## 2020-04-21 VITALS — BP 138/80 | HR 86 | Resp 16 | Ht 61.0 in | Wt 125.0 lb

## 2020-04-21 DIAGNOSIS — F339 Major depressive disorder, recurrent, unspecified: Secondary | ICD-10-CM

## 2020-04-21 DIAGNOSIS — F411 Generalized anxiety disorder: Secondary | ICD-10-CM

## 2020-04-21 MED ORDER — DULOXETINE 30 MG CAPSULE,DELAYED RELEASE
30.0000 mg | DELAYED_RELEASE_CAPSULE | Freq: Every day | ORAL | 2 refills | Status: DC
Start: 2020-04-21 — End: 2020-05-14

## 2020-04-21 MED ORDER — CLONAZEPAM 0.5 MG TABLET
ORAL_TABLET | ORAL | 2 refills | Status: DC
Start: 2020-04-21 — End: 2020-05-14

## 2020-04-21 MED ORDER — DULOXETINE 60 MG CAPSULE,DELAYED RELEASE
60.0000 mg | DELAYED_RELEASE_CAPSULE | Freq: Every day | ORAL | 2 refills | Status: DC
Start: 2020-04-21 — End: 2020-05-14

## 2020-04-21 NOTE — Nursing Note (Signed)
04/21/20 1413   PHQ 9 (follow up)   Little interest or pleasure in doing things. 0   Feeling down, depressed, or hopeless 1   PHQ 2 Total 1   Trouble falling or staying asleep, or sleeping too much. 0   Feeling tired or having little energy 0   Poor appetite or overeating 0   Feeling bad about yourself/ that you are a failure in the past 2 weeks? 0   Trouble concentrating on things in the past 2 weeks? 1   Moving/Speaking slowly or being fidgety or restless  in the past 2 weeks? 1   Thoughts that you would be better off DEAD, or of hurting yourself in some way. 0   If you checked off any problems, how difficult have these problems made it for you to do your work, take care of things at home, or get along with other people? Somewhat difficult   PHQ 9 Total 3   Interpretation of Total Score No depression

## 2020-04-21 NOTE — Nursing Note (Signed)
04/21/20 1412   [place] Suicide Severity Rating Scale (Since Last Contact Screener)   1. Wish to be Dead (Since Last Contact) N   2. Non-Specific Active Suicidal Thoughts (Since Last Contact) N   6. Suicidal Behavior (Since Last Contact) N   Calculated C-SSRS Risk Score (Since Last Contact) No Risk Indicated

## 2020-04-21 NOTE — Progress Notes (Signed)
BEHAVIORAL MEDICINE, [facility]   [address]  [place] [address]       Name: Elizabeth Hunter MRN:  [id number]   Date: 04/21/2020 Age: 80 y.o.       History:        Last visit: 03/12/2020    Interval History: Complains of tearfulness at times and is unsure why. "I feel so bad". Complains of shakiness, sweats which are "awful".  Complains of high anxiety about "everything".  Says there is no logical reason for it.     Has one daughter in [place] and also a son but he does not speak to them in many years. His birthday is coming around 05/15/2020.  Son DOES text his father though and she minimizes that upset her.    Husband has been ill S/P Covid and is still on oxygen. We discussed that he nearly died. Then could say he was untidy etc. I appreciated her honesty.  I supported her today.      Substance use: Denies any substance use.    Review of systems:   Constitutional: Denies any medication side effect.  GI: Denies any diarrhea, constipation.  Dermatological:  Denies any skin rash    Medical and Surgical history in the interim: Non-contributory.  Past Medical History:   Diagnosis Date   . AAA (abdominal aortic aneurysm) (CMS HCC)    . Back ache    . Bowel trouble     obstruction/nonsurgical   . Chronic headaches    . Constipation    . Depression    . Fibromyalgia    . Incomplete defecation    . Osteoarthritis    . Rectocele    . Splenic artery aneurysm (CMS [facility])        Past Surgical History:   Procedure Laterality Date   . HX APPENDECTOMY     . HX CESAREAN SECTION      2   . HX CHOLECYSTECTOMY     . HX COLONOSCOPY     . HX LAP CHOLECYSTECTOMY  2012   . HX SINUS SURGERY  2014   . HX WISDOM TEETH EXTRACTION         Current Outpatient Medications   Medication Sig Dispense Refill   . clonazePAM (KLONOPIN) 0.5 mg Oral Tablet One-half tab po qam and 1 tab po qhs. Indications: panic disorder 45 Tablet 2   . DULoxetine (CYMBALTA DR) 30 mg Oral Capsule, Delayed Release(E.C.) Take 1 Capsule  (30 mg total) by mouth Once a day Indications: major depressive disorder, neuropathic pain 30 Capsule 2   . DULoxetine (CYMBALTA DR) 60 mg Oral Capsule, Delayed Release(E.C.) Take 1 Capsule (60 mg total) by mouth Once a day Indications: major depressive disorder 30 Capsule 2   . meloxicam (MOBIC) 15 mg Oral Tablet Take 1 Tablet (15 mg total) by mouth Once a day 90 Tablet 3     No current facility-administered medications for this visit.        Allergies: No Known Allergies    Exam:      BP 138/80   Pulse 86   Resp 16   Ht 1.549 m (5\' 1" )   Wt 56.7 kg (125 lb)   SpO2 98%   BMI 23.62 kg/m       General appearance: Well dressed, well groomed. Calm and pleasant.  Musculoskeletal: Normal gait and station. Normal muscle strength and tone.  Speech: Articulate, appropriate with spontaneous elaborations  Affect: Congruent with mood  which is described as "down".  Thought process: Logical, linear and goal directed.  Association: Intact  Abnormal thought: None and denies SI/HI.  Judgement and Insight: Good.  Attention and concentration: Good.  Language: Fluent use of English.    Assessment: Diagnosis: unchanged.  Slight decline in mood.    Substance use is not a concern. No legal issues. No acute suicide risk. Discussed status and reviewed options for therapy. Discussed medications, psychotherapy and structures/schedule of program. Reviewed plan, goals, rationale, risks, benefits, and alternatives. Patient indicates understanding and agreement. Has decision making capacity.        ICD-10-CM    1. 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 GAD (generalized anxiety disorder)  F41.1         Plan:   Medications: Increase the Cymbalta to 90mg  po qam. Continue Klonopin /stop attempting to taper and give her 0.5 mg q.a.m. and 0.25 mg q.h.s..  I have some concern that the sweating at night is due to Cymbalta and we will find out.  May consider mirtazapine at night.  I do not think she will respond to BuSpar at all.  She  is not agitated so an antipsychotic agent at this point is not indicated.  Therapy:  Continue current supportive.  RTC: 6 weeks.  Labs/test/imagining: None    I spent over 25 minutes on this case today with the patient present over 66% of that time and we discussed  Her issues, medications, potential adverse effects of medications treatment plan, stressors and I obtained informed consent over 50% of her time was spent on counseling, supportive psychotherapy and patient Education.      , MD        This note was partially created using voice recognition software and is inherently subject to errors including those of syntax and "sound-alike" substitutions which may escape proofreading.  In such instances, original meaning may be extrapolated by contextual derivation.

## 2020-04-24 ENCOUNTER — Telehealth (INDEPENDENT_AMBULATORY_CARE_PROVIDER_SITE_OTHER): Payer: Self-pay | Admitting: Physician Assistant

## 2020-04-24 NOTE — Telephone Encounter (Signed)
Patient called back and said she didn't know what the procedure was but they were suppose to put little circles on her maybe to do with her heart. She said if you really want her to do it she will. 04/24/20 tjm

## 2020-04-24 NOTE — Telephone Encounter (Signed)
Which procedure?

## 2020-04-25 ENCOUNTER — Encounter (HOSPITAL_COMMUNITY): Payer: Self-pay

## 2020-05-05 ENCOUNTER — Telehealth (INDEPENDENT_AMBULATORY_CARE_PROVIDER_SITE_OTHER): Payer: Self-pay | Admitting: PSYCHIATRY

## 2020-05-05 ENCOUNTER — Ambulatory Visit (INDEPENDENT_AMBULATORY_CARE_PROVIDER_SITE_OTHER): Payer: HMO

## 2020-05-05 ENCOUNTER — Other Ambulatory Visit: Payer: Self-pay

## 2020-05-05 VITALS — Ht 61.0 in | Wt 130.0 lb

## 2020-05-05 DIAGNOSIS — R150 Incomplete defecation: Secondary | ICD-10-CM

## 2020-05-05 DIAGNOSIS — K5900 Constipation, unspecified: Secondary | ICD-10-CM

## 2020-05-05 DIAGNOSIS — N816 Rectocele: Secondary | ICD-10-CM

## 2020-05-05 NOTE — Telephone Encounter (Signed)
Informed pt, states that she is nervous about stopping it but will do so.

## 2020-05-05 NOTE — Procedures (Signed)
COLORECTAL SURGERY, [facility]  [address]  [place] [address]    Procedure Note    Name: Elizabeth Hunter MRN:  [id number]   Date: 05/05/2020 Age: 80 y.o.       BIOFEEDBACK TRAINING, PERINEAL MUSCLES, ANORECTAL OR URETHRAL SPHINCTER, INCLUDING EMG AND/OR MANOMETERY; UP TO 60 MINUTES (AMB ONLY)    Date/Time: 05/05/2020 2:50 PM  Performed by: Earl Lites, PhD  Authorized by: Burman Nieves, MD     How many minutes has this been performed?:  20   Patient returns to the AR physiology lab reporting that she cont to note improvement.  Her BM are 2-3 per day.  She did try increasing her fiber slightly.  We returned to EMG guided retraining and there was a marked increased in tonic motor output.  She was advised to cont the same home program and f/u 2-3 weeks.      Lorna Dibble, PhD          Earl Lites, PhD

## 2020-05-05 NOTE — Telephone Encounter (Signed)
Pt called, tearful, stating "I'm in really really bad shape" Stating that she can't stop crying, can't do anything, is not able to function because she is too depressed and anxious. States she has been waking up in the morning drenched in sweat from having "cold sweats" through the night. States, "I just need someone to help me, I can't go on like this". States that she doesn't feel any benefit from the increase in Cymbalta. Also states that the Klonopin is not effective for her anxiety.

## 2020-05-06 NOTE — Telephone Encounter (Signed)
Pt called stating that she wants to wait until the weekend to stop taking Cymbalta, I told her to call us back after she has stopped it for three days.

## 2020-05-07 ENCOUNTER — Ambulatory Visit (INDEPENDENT_AMBULATORY_CARE_PROVIDER_SITE_OTHER): Payer: Self-pay

## 2020-05-12 ENCOUNTER — Telehealth (INDEPENDENT_AMBULATORY_CARE_PROVIDER_SITE_OTHER): Payer: Self-pay | Admitting: PSYCHIATRY

## 2020-05-12 MED ORDER — MIRTAZAPINE 7.5 MG TABLET
7.5000 mg | ORAL_TABLET | Freq: Every evening | ORAL | 0 refills | Status: DC
Start: 2020-05-12 — End: 2020-05-14

## 2020-05-12 NOTE — Telephone Encounter (Signed)
Pt stopped taking Cymbalta, states that each night she had really bad cold sweats up and down 3-4 times a night. Was very upset and didn't know what to do with herself without the medication. Was anxious, shaky, dizzy, losing balance, states that she believes these are being caused by the klonopin. Stating she was dizzy and can't see straight after taking klonopin. States, "I need help today. I can't go on like this."

## 2020-05-12 NOTE — Telephone Encounter (Addendum)
Spoke with pt to let her know that she could take Klonopin, she states that she was foolish to ask that because she is having all the side effects possible with the Klonopin. States that she needs something to help her today, wants to know if you can send a replacement for the Cymbalta instead.     I Escribed remeron 7.5mg  po qhs.  PBP MD

## 2020-05-12 NOTE — Telephone Encounter (Signed)
Pt called for the time today, stating that she is really a mess and needs help as soon as possible. Wants to know if she can speak to you. [phone].

## 2020-05-12 NOTE — Telephone Encounter (Signed)
Spoke with pt, she is scheduled to come in for Wednesday. She is asking if she will be restarted on anything to replace the Cymbalta. States that she thinks that she can manage until Wednesday.

## 2020-05-12 NOTE — Telephone Encounter (Signed)
Pt calling again, stating that she thinks that she is having a panic attack and wants to know if she can take a second half of a klonopin now and not take one tonight. States she took one half this morning as prescribed.

## 2020-05-14 ENCOUNTER — Encounter (INDEPENDENT_AMBULATORY_CARE_PROVIDER_SITE_OTHER): Payer: Self-pay | Admitting: PSYCHIATRY

## 2020-05-14 ENCOUNTER — Other Ambulatory Visit: Payer: Self-pay

## 2020-05-14 ENCOUNTER — Ambulatory Visit (INDEPENDENT_AMBULATORY_CARE_PROVIDER_SITE_OTHER): Payer: HMO | Admitting: PSYCHIATRY

## 2020-05-14 VITALS — BP 118/72 | HR 120 | Resp 16 | Ht 61.0 in | Wt 125.0 lb

## 2020-05-14 DIAGNOSIS — F339 Major depressive disorder, recurrent, unspecified: Secondary | ICD-10-CM

## 2020-05-14 DIAGNOSIS — F411 Generalized anxiety disorder: Secondary | ICD-10-CM

## 2020-05-14 MED ORDER — MIRTAZAPINE 7.5 MG TABLET
7.5000 mg | ORAL_TABLET | Freq: Every evening | ORAL | 1 refills | Status: DC
Start: 2020-05-14 — End: 2020-06-10

## 2020-05-14 MED ORDER — CLONAZEPAM 0.5 MG TABLET
ORAL_TABLET | ORAL | 2 refills | Status: DC
Start: 2020-05-14 — End: 2020-06-10

## 2020-05-14 NOTE — Nursing Note (Signed)
05/14/20 1428   PHQ 9 (follow up)   Little interest or pleasure in doing things. 3   Feeling down, depressed, or hopeless 1   PHQ 2 Total 4   Trouble falling or staying asleep, or sleeping too much. 1   Feeling tired or having little energy 0   Poor appetite or overeating 0   Feeling bad about yourself/ that you are a failure in the past 2 weeks? 0   Trouble concentrating on things in the past 2 weeks? 1   Moving/Speaking slowly or being fidgety or restless  in the past 2 weeks? 0   Thoughts that you would be better off DEAD, or of hurting yourself in some way. 0   If you checked off any problems, how difficult have these problems made it for you to do your work, take care of things at home, or get along with other people? Very difficult   PHQ 9 Total 6   Interpretation of Total Score Mild depression

## 2020-05-14 NOTE — Progress Notes (Signed)
[facility]   [address]  [place] [address]       Name: Elizabeth Hunter MRN:  [id number]   Date: 05/14/2020 Age: 80 y.o.       History:        Last visit:  04/21/2020    Interval History:  Added this appointment today and presents with her husband today. Could not tolerate the Cymbalta increase.  "I have been a basket case". Denies stressors at all and husband agrees. Remeron has been helping to calm her at night and the day went well. Last night had some cold sweating again a few times and woke up "shaking". Has been "shaking" again today.     it took some time but it appears that for the last 2 days she has not been taking grieving Klonopin and she thought that the Remeron substituted for the dose of Klonopin.  I wrote out what I wanted to do and we talked at length and I supported today.  I taught her thought stopping techniques and relaxation techniques.  She remains somewhat negative about the whole thing but eventually we reached an agreement that she will work on her tendency to catastrophize.      Substance use: Denies any substance use.    Review of systems:   Constitutional: Denies any medication side effect.  GI: Denies any diarrhea, constipation.  Dermatological:  Denies any skin rash    Medical and Surgical history in the interim: Non-contributory.  Past Medical History:   Diagnosis Date   . AAA (abdominal aortic aneurysm) (CMS HCC)    . Back ache    . Bowel trouble     obstruction/nonsurgical   . Chronic headaches    . Constipation    . Depression    . Fibromyalgia    . Incomplete defecation    . Osteoarthritis    . Rectocele    . Splenic artery aneurysm (CMS [facility])        Past Surgical History:   Procedure Laterality Date   . HX APPENDECTOMY     . HX CESAREAN SECTION      2   . HX CHOLECYSTECTOMY     . HX COLONOSCOPY     . HX LAP CHOLECYSTECTOMY  2012   . HX SINUS SURGERY  2014   . HX WISDOM TEETH EXTRACTION         Current Outpatient Medications    Medication Sig Dispense Refill   . clonazePAM (KLONOPIN) 0.5 mg Oral Tablet One-half tab po qam and 1 tab po qhs. Indications: panic disorder 45 Tablet 2   . meloxicam (MOBIC) 15 mg Oral Tablet Take 1 Tablet (15 mg total) by mouth Once a day 90 Tablet 3   . Mirtazapine (REMERON) 7.5 mg Oral Tablet Take 1 Tablet (7.5 mg total) by mouth Every night for 30 days Indications: major depressive disorder 30 Tablet 0     No current facility-administered medications for this visit.        Allergies: No Known Allergies    Exam:      BP 118/72   Pulse (!) 120   Resp 16   Ht 1.549 m (5\' 1" )   Wt 56.7 kg (125 lb)   SpO2 96%   BMI 23.62 kg/m       General appearance: Well dressed, well groomed. Calm and pleasant.  Musculoskeletal: Normal gait and station. Normal muscle strength and tone.  Speech: Articulate, appropriate with  spontaneous elaborations  Affect: Congruent with mood which is described as "too nervous".  Thought process: Logical, linear and goal directed.  Association: Intact  Abnormal thought: None  /Denies hopelessness, death wishes or suicidal ideations.  Not psychotic.  Judgement and Insight: Partial/  Limited  Attention and concentration: Adequate but does need some repetition  Language: Fluent use of English.    Assessment: Diagnosis: unchanged.    Some progress made psychiatric still needs better control of anxious and depressive symptoms.    Substance use is not a concern. No legal issues. No acute suicide risk. Discussed status and reviewed options for therapy. Discussed medications, psychotherapy and structures/schedule of program. Reviewed plan, goals, rationale, risks, benefits, and alternatives. Patient indicates understanding and agreement. Has decision making capacity.        ICD-10-CM    1. 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 Generalized anxiety disorder  F41.1         Plan:   Medications:   Continue Remeron 7.5 mg at HS.  Continue Klonopin 0.25 mg q.a.m. and 0.5 mg  q.p.m.Marland Kitchen  Therapy:  Continue current supportive work.  RTC:   Three weeks  Labs/test/imagining: None   I spent approximately 25 minutes on this case today with the patient present over 66% of that time and we discussed her treatment plan, issues, medications, potential medication adverse effects including up Klonopin can impair her ability to operate machinery / increased risk of falls and I obtained informed consent.  Over 50% of her time was spent on counseling, supportive psychotherapy and patient Education.    Latina Craver, MD      This note was partially created using voice recognition software and is inherently subject to errors including those of syntax and "sound-alike" substitutions which may escape proofreading.  In such instances, original meaning may be extrapolated by contextual derivation.

## 2020-05-15 ENCOUNTER — Ambulatory Visit (INDEPENDENT_AMBULATORY_CARE_PROVIDER_SITE_OTHER): Payer: HMO | Admitting: Physician Assistant

## 2020-05-15 ENCOUNTER — Telehealth (INDEPENDENT_AMBULATORY_CARE_PROVIDER_SITE_OTHER): Payer: Self-pay | Admitting: PSYCHIATRY

## 2020-05-15 ENCOUNTER — Encounter (INDEPENDENT_AMBULATORY_CARE_PROVIDER_SITE_OTHER): Payer: Self-pay | Admitting: Physician Assistant

## 2020-05-15 ENCOUNTER — Telehealth (INDEPENDENT_AMBULATORY_CARE_PROVIDER_SITE_OTHER): Payer: Self-pay | Admitting: Family Medicine

## 2020-05-15 VITALS — BP 122/80 | HR 80 | Temp 97.3°F | Resp 12 | Ht 61.0 in | Wt 126.6 lb

## 2020-05-15 DIAGNOSIS — F320 Major depressive disorder, single episode, mild: Secondary | ICD-10-CM

## 2020-05-15 DIAGNOSIS — F411 Generalized anxiety disorder: Secondary | ICD-10-CM

## 2020-05-15 DIAGNOSIS — F410 Panic disorder [episodic paroxysmal anxiety] without agoraphobia: Secondary | ICD-10-CM

## 2020-05-15 NOTE — Telephone Encounter (Signed)
Pt called stating that she did not sleep well at all last night without taking the klonopin. States that she had cold sweats again. Reports being shaky now. Asked if she can take klonopin at night and remeron in the afternoon? Also wanted to know your thoughts on her restarting Celexa.

## 2020-05-15 NOTE — Telephone Encounter (Signed)
Pt wants her visit today to be a phone visit.  She is not feeling well enough to come in  I switched it in computer and will call and register her later this am

## 2020-05-15 NOTE — Telephone Encounter (Signed)
Ok

## 2020-05-15 NOTE — Progress Notes (Signed)
FAMILY MEDICINE, [facility]  [address]  [place] [address]       Name: Elizabeth Hunter  MRN:  [id number]       Date: 05/15/2020  DOB:      Age: 80 y.o.             Reason for Visit: Follow Up (1 month)    History of Present Illness  Elizabeth Hunter is a 80 y.o. female who is being seen today for anxiety. States she was able to be seen yesterday by her psychiatrist Dr. Corena Herter. Pt states she has stopped taking the Cymbalta this past weekend. States she was started on Remeron 7.5mg  and to continue with klonopin. Pt states she did not do well last night. States she is having the cold night sweats. States she dreads night time because she doesn't want to wake up sweating. Pt states today is a tough day for her due to it being her son's birthday. She does not speak with this son which causes her stress and more anxiety. States she wishes she was able to speak with her son. She does report that due to her mental health her daughter is driving home from [place] at this very moment. Pt denies SI. Pt denies HI. States she doesn't know how much longer she can deal with the symptoms of anxiety. States she wants to get back to doing things with her friends and family. States her husband is feeling better. Has had to have his lungs drained x 2 but she states the fluid did not show signs of cancer.     Past Medical History:   Diagnosis Date    AAA (abdominal aortic aneurysm) (CMS HCC)     Back ache     Bowel trouble     obstruction/nonsurgical    Chronic headaches     Constipation     Depression     Fibromyalgia     Incomplete defecation     Osteoarthritis     Rectocele     Splenic artery aneurysm (CMS HCC)          Past Surgical History:   Procedure Laterality Date    HX APPENDECTOMY      HX CESAREAN SECTION      2    HX CHOLECYSTECTOMY      HX COLONOSCOPY      HX LAP CHOLECYSTECTOMY  2012    HX SINUS SURGERY  2014    HX WISDOM TEETH EXTRACTION           Current  Outpatient Medications   Medication Sig    clonazePAM (KLONOPIN) 0.5 mg Oral Tablet One-half tab po qam and 1 tab po every evening. Indications: panic disorder    meloxicam (MOBIC) 15 mg Oral Tablet Take 1 Tablet (15 mg total) by mouth Once a day    Mirtazapine (REMERON) 7.5 mg Oral Tablet Take 1 Tablet (7.5 mg total) by mouth Every night Indications: major depressive disorder     No Known Allergies  Family Medical History:     Problem Relation (Age of Onset)    Heart Attack Father (72)    Hypertension (High Blood Pressure) Mother (24)          Social History     Tobacco Use    Smoking status: Never Smoker    Smokeless tobacco: Never Used   Vaping Use    Vaping Use: Never used   Substance Use Topics  Alcohol use: No    Drug use: Yes     Types: Benzodiazepines       Nursing Notes  There are no exam notes on file for this visit.     Review of Systems  Review of Systems   Constitutional: Negative for chills, fever, malaise/fatigue and weight loss.   HENT: Negative for congestion, ear discharge, ear pain, hearing loss, nosebleeds, sinus pain and sore throat.    Eyes: Negative for blurred vision, photophobia and discharge.   Respiratory: Negative for cough, hemoptysis, sputum production, shortness of breath and wheezing.    Cardiovascular: Negative for chest pain, palpitations and leg swelling.   Gastrointestinal: Negative for abdominal pain, blood in stool, constipation, diarrhea, heartburn, nausea and vomiting.   Genitourinary: Negative for dysuria, frequency, hematuria and urgency.   Musculoskeletal: Positive for back pain. Negative for falls, joint pain, myalgias and neck pain.   Skin: Negative for itching and rash.   Neurological: Negative for dizziness, tingling, tremors, weakness and headaches.   Endo/Heme/Allergies: Negative for environmental allergies. Does not bruise/bleed easily.   Psychiatric/Behavioral: Positive for depression. Negative for memory loss, substance abuse and suicidal ideas. The  patient is nervous/anxious and has insomnia.         Following with psychiatry        Physical Exam:  BP 122/80    Pulse 80    Temp 36.3 C (97.3 F)    Resp 12    Ht 1.549 m (5\' 1" )    Wt 57.4 kg (126 lb 9.6 oz)    SpO2 97%    BMI 23.92 kg/m       Physical Exam  Vitals and nursing note reviewed.   Constitutional:       General: She is not in acute distress.     Appearance: She is not diaphoretic.   HENT:      Head: Normocephalic and atraumatic.   Eyes:      Conjunctiva/sclera: Conjunctivae normal.      Pupils: Pupils are equal, round, and reactive to light.   Cardiovascular:      Rate and Rhythm: Normal rate and regular rhythm.      Heart sounds: Normal heart sounds.   Pulmonary:      Effort: Pulmonary effort is normal.      Breath sounds: Normal breath sounds.   Skin:     General: Skin is warm and dry.   Neurological:      Mental Status: She is alert and oriented to person, place, and time.      Coordination: Coordination normal.      Gait: Gait is intact.   Psychiatric:         Mood and Affect: Mood and affect normal.         Cognition and Memory: Memory normal.         Judgment: Judgment normal.      Comments: Tearful during exam but after talking for a long period of time the patient was able to calm and talk calmly.          Assessment and Plan    ENCOUNTER DIAGNOSES     ICD-10-CM   1. Generalized anxiety disorder with panic attacks  F41.1    F41.0   2. 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  Continue with current treatment plan from Dr. Corena Herter   Pt will try his suggestions tonight with medications.    No changes were made at today's office visit  We will see the patient back in about 2 weeks  She declines referral for psychotherapy at this time    Pt to try to increase activity  Balanced diet    Keep f/u  appointments with psychiatry and GI    Follow up: Return in about 2 weeks (around 05/29/2020), or if symptoms worsen or fail to improve.      This patient was seen independently.    Read Drivers, PA-C  05/15/2020, 14:00

## 2020-05-15 NOTE — Telephone Encounter (Addendum)
Informed pt, Per Dr Renae Fickle; "No Celexa. Move the evening dose of Klonopin back a few hours to around 8:00 or 9:00 pm. Continue remeron"

## 2020-05-19 ENCOUNTER — Telehealth (INDEPENDENT_AMBULATORY_CARE_PROVIDER_SITE_OTHER): Payer: Self-pay | Admitting: PSYCHIATRY

## 2020-05-19 ENCOUNTER — Encounter (INDEPENDENT_AMBULATORY_CARE_PROVIDER_SITE_OTHER): Payer: Self-pay | Admitting: PSYCHIATRY

## 2020-05-19 NOTE — Telephone Encounter (Signed)
Husband called stating pt has been having a lot of anxiety all day. States that she doesn't seem to be any better after medication change.

## 2020-05-20 NOTE — Telephone Encounter (Signed)
Informed pt .

## 2020-05-29 ENCOUNTER — Encounter (INDEPENDENT_AMBULATORY_CARE_PROVIDER_SITE_OTHER): Payer: Self-pay | Admitting: Physician Assistant

## 2020-05-29 ENCOUNTER — Ambulatory Visit (INDEPENDENT_AMBULATORY_CARE_PROVIDER_SITE_OTHER): Payer: HMO | Admitting: Physician Assistant

## 2020-05-29 ENCOUNTER — Telehealth (INDEPENDENT_AMBULATORY_CARE_PROVIDER_SITE_OTHER): Payer: Self-pay | Admitting: Physician Assistant

## 2020-05-29 ENCOUNTER — Other Ambulatory Visit: Payer: Self-pay

## 2020-05-29 VITALS — BP 120/74 | HR 80 | Temp 98.0°F | Resp 12 | Ht 61.0 in | Wt 134.0 lb

## 2020-05-29 DIAGNOSIS — F320 Major depressive disorder, single episode, mild: Secondary | ICD-10-CM

## 2020-05-29 DIAGNOSIS — M545 Low back pain, unspecified: Secondary | ICD-10-CM

## 2020-05-29 DIAGNOSIS — F410 Panic disorder [episodic paroxysmal anxiety] without agoraphobia: Secondary | ICD-10-CM

## 2020-05-29 DIAGNOSIS — F411 Generalized anxiety disorder: Secondary | ICD-10-CM

## 2020-05-29 MED ORDER — KETOROLAC 30 MG/ML (1 ML) INJECTION SOLUTION
30.0000 mg | Freq: Once | INTRAMUSCULAR | 0 refills | Status: DC
Start: 2020-05-29 — End: 2020-06-10

## 2020-05-29 NOTE — Progress Notes (Signed)
FAMILY MEDICINE, [facility]  [address]  [place] [address]       Name: Elizabeth Hunter  MRN:  [id number]       Date: 05/29/2020  DOB:      Age: 80 y.o.             Reason for Visit: Depression and Dizzy    History of Present Illness  Elizabeth Hunter is a 80 y.o. female who is being seen today for depression and anxiety f/u. Pt states she has now stopped taking the Cymbalta. States her sweating at night has greatly improved. States she isn't waking up soaking wet with sweat any longer. States she is still having symptoms of dizziness.reports she will f/u with psychiatry on 06/23/20. Pt states her therapist told her she didn't need to see her any longer.  Pt reports her anxiety still does not feel controlled. Having daily panic attacks. States she is taking the medications as prescribed and directed by Dr.Papadimitriou.     Pt states she has tried working outside in her yard but has made her back pain worse and feels her pain is flared. States she is taking the Mobic to help with pain.     Past Medical History:   Diagnosis Date   . AAA (abdominal aortic aneurysm) (CMS HCC)    . Back ache    . Bowel trouble     obstruction/nonsurgical   . Chronic headaches    . Constipation    . Depression    . Fibromyalgia    . Incomplete defecation    . Osteoarthritis    . Rectocele    . Splenic artery aneurysm (CMS [facility])          Past Surgical History:   Procedure Laterality Date   . HX APPENDECTOMY     . HX CESAREAN SECTION      2   . HX CHOLECYSTECTOMY     . HX COLONOSCOPY     . HX LAP CHOLECYSTECTOMY  2012   . HX SINUS SURGERY  2014   . HX WISDOM TEETH EXTRACTION           Current Outpatient Medications   Medication Sig   . clonazePAM (KLONOPIN) 0.5 mg Oral Tablet One-half tab po qam and 1 tab po every evening. Indications: panic disorder   . ketorolac (TORADOL) 30 mg/mL (1 mL) Injection Solution Inject 1 mL (30 mg total) into the muscle One time for 1 dose   . meloxicam (MOBIC) 15 mg Oral Tablet  Take 1 Tablet (15 mg total) by mouth Once a day   . Mirtazapine (REMERON) 7.5 mg Oral Tablet Take 1 Tablet (7.5 mg total) by mouth Every night Indications: major depressive disorder     No Known Allergies  Family Medical History:     Problem Relation (Age of Onset)    Heart Attack Father (40)    Hypertension (High Blood Pressure) Mother (74)          Social History     Tobacco Use   . Smoking status: Never Smoker   . Smokeless tobacco: Never Used   Vaping Use   . Vaping Use: Never used   Substance Use Topics   . Alcohol use: No   . Drug use: Yes     Types: Benzodiazepines       Nursing Notes  There are no exam notes on file for this visit.     Review of Systems  Review of Systems   Constitutional: Negative for chills, fever, malaise/fatigue and weight loss.   HENT: Negative for congestion, ear discharge, ear pain, hearing loss, nosebleeds, sinus pain and sore throat.    Eyes: Negative for blurred vision, photophobia and discharge.   Respiratory: Negative for cough, hemoptysis, sputum production, shortness of breath and wheezing.    Cardiovascular: Negative for chest pain, palpitations and leg swelling.   Gastrointestinal: Negative for abdominal pain, blood in stool, constipation, diarrhea, heartburn, nausea and vomiting.   Genitourinary: Negative for dysuria, frequency, hematuria and urgency.   Musculoskeletal: Positive for back pain and joint pain. Negative for falls, myalgias and neck pain.   Skin: Negative for itching and rash.   Neurological: Positive for dizziness. Negative for tingling, tremors, weakness and headaches.   Endo/Heme/Allergies: Negative for environmental allergies. Does not bruise/bleed easily.   Psychiatric/Behavioral: Positive for depression. Negative for memory loss, substance abuse and suicidal ideas. The patient is nervous/anxious. The patient does not have insomnia.        Physical Exam:  BP 120/74 (Site: Right, Patient Position: Sitting, Cuff Size: Adult)   Pulse 80   Temp 36.7 C (98  F) (Tympanic)   Resp 12   Ht 1.549 m (5\' 1" )   Wt 60.8 kg (134 lb)   SpO2 97%   BMI 25.32 kg/m       Physical Exam  Vitals and nursing note reviewed.   Constitutional:       General: She is not in acute distress.     Appearance: She is not diaphoretic.   HENT:      Head: Normocephalic and atraumatic.   Eyes:      Conjunctiva/sclera: Conjunctivae normal.      Pupils: Pupils are equal, round, and reactive to light.   Cardiovascular:      Rate and Rhythm: Normal rate and regular rhythm.      Heart sounds: Normal heart sounds.   Pulmonary:      Effort: Pulmonary effort is normal.      Breath sounds: Normal breath sounds.   Musculoskeletal:      Lumbar back: Decreased range of motion.   Skin:     General: Skin is warm and dry.   Neurological:      Mental Status: She is alert and oriented to person, place, and time.      Coordination: Coordination normal.      Gait: Gait is intact.   Psychiatric:         Mood and Affect: Mood and affect normal.         Cognition and Memory: Memory normal.         Judgment: Judgment normal.         Assessment and Plan    ENCOUNTER DIAGNOSES     ICD-10-CM   1. Generalized anxiety disorder with panic attacks  F41.1    F41.0   2. 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 Low back pain  M54.50      On the day of the encounter, a total of  30 minutes was spent on this patient encounter including review of historical information, examination, documentation and post-visit activities.     Pt will keep up coming appointment with psychiatry  Continue with current medications  Will refer to therapist     Toradol injection given for low back pain     We discussed possible referral to neurologist and ordering MRI of brain  Pt wishes to wait  for the next 2 weeks to continue to see if her symptoms continue to improve without taking the Cymbalta     Orders Placed This Encounter   . Referral to External Provider (AMB)   . ketorolac (TORADOL) 30 mg/mL (1 mL) Injection Solution     Follow up: Return  in about 2 weeks (around 06/12/2020), or if symptoms worsen or fail to improve.      This patient was seen independently.    Read Drivers, PA-C  05/29/2020, 13:11

## 2020-05-29 NOTE — Telephone Encounter (Signed)
05/29/20 pc left vm for Elizabeth Hunter with appt. Date and time for vic cerra  07/02/20 at 1pm arrive at 12:50 pm  [facility] [address] phone# [phone]

## 2020-05-29 NOTE — Nursing Note (Signed)
05/29/20 1400   Medication Administration   Initials [id number]   Medication  Toradol   Medication Dose Given 60mg    Route of Administration IM   Site Right Gluteus   [facility] # [facility]   LOT # [id number]   Expiration date 09/01/21   Manufacturer [employer]   Clinic Supplied Yes   Patient Supplied No

## 2020-06-02 ENCOUNTER — Ambulatory Visit (INDEPENDENT_AMBULATORY_CARE_PROVIDER_SITE_OTHER): Payer: Self-pay

## 2020-06-02 ENCOUNTER — Telehealth (INDEPENDENT_AMBULATORY_CARE_PROVIDER_SITE_OTHER): Payer: Self-pay | Admitting: Psychiatry

## 2020-06-02 NOTE — Telephone Encounter (Signed)
This patient of Dr. Renae Fickle calls in complaining of dizziness since taking Remeron. She started it 4/11, and has fallen 3 times. Her husband has gotten her a cane for walking, because she is under the impression that Dr. Renae Fickle will want her to keep trying with the Remeron. She is 80 years old. I explained that Dr. Renae Fickle is out of the office but I would speak to another provider and see if she could stop taking the Remeron.  I spoke with Dr. Chales Abrahams and he agreed she should stop the Remeron.   I called to give Edie the information.She asked about starting another antidepressant, saying she is crying quite often. I told her I would ask Dr. Chales Abrahams tomorrow.

## 2020-06-03 ENCOUNTER — Telehealth (INDEPENDENT_AMBULATORY_CARE_PROVIDER_SITE_OTHER): Payer: Self-pay | Admitting: PSYCHIATRY

## 2020-06-03 NOTE — Telephone Encounter (Addendum)
Elizabeth Hunter has stopped the Remeron, and is seeing improvement in her dizziness. She is very upset and anxious about not having a replacement antidepressant. I was able to convince her to follow through with her plans to go play cards with her friends. A friend told her about genetic testing and she is very interested in having that done.

## 2020-06-10 ENCOUNTER — Ambulatory Visit (INDEPENDENT_AMBULATORY_CARE_PROVIDER_SITE_OTHER): Payer: HMO | Admitting: PSYCHIATRY

## 2020-06-10 ENCOUNTER — Other Ambulatory Visit: Payer: Self-pay

## 2020-06-10 ENCOUNTER — Encounter (INDEPENDENT_AMBULATORY_CARE_PROVIDER_SITE_OTHER): Payer: Self-pay | Admitting: PSYCHIATRY

## 2020-06-10 VITALS — BP 138/64 | HR 87 | Resp 16 | Ht 61.0 in | Wt 128.0 lb

## 2020-06-10 DIAGNOSIS — F3341 Major depressive disorder, recurrent, in partial remission: Secondary | ICD-10-CM

## 2020-06-10 DIAGNOSIS — F411 Generalized anxiety disorder: Secondary | ICD-10-CM

## 2020-06-10 MED ORDER — MIRTAZAPINE 7.5 MG TABLET
7.5000 mg | ORAL_TABLET | Freq: Every evening | ORAL | 2 refills | Status: DC
Start: 2020-06-10 — End: 2020-07-07

## 2020-06-10 MED ORDER — CLONAZEPAM 0.5 MG TABLET
ORAL_TABLET | ORAL | 1 refills | Status: DC
Start: 2020-06-10 — End: 2020-07-08

## 2020-06-10 NOTE — Nursing Note (Signed)
06/10/20 1504   [place] Suicide Severity Rating Scale (Since Last Contact Screener)   1. Wish to be Dead (Since Last Contact) N   2. Non-Specific Active Suicidal Thoughts (Since Last Contact) N   6. Suicidal Behavior (Since Last Contact) N   Calculated C-SSRS Risk Score (Since Last Contact) No Risk Indicated

## 2020-06-10 NOTE — Progress Notes (Signed)
BEHAVIORAL MEDICINE, [facility]   [address]  [place] [address]       Name: Naveen Lorusso MRN:  [id number]   Date: 06/10/2020 Age: 80 y.o.       History:        Last visit:  05/14/2020    Interval History:  Complains of "dizziness'" and feels it may be the remeron or the klonopin and wants off of it but said she is worried about that.  Overall has had less obsessive thoughts, less panic sx and has been feeling better.  Has been spending times with some friends playing cards etcetera.  Can be left alone by husband.  Denies hopelessness, death wishes or suicidal ideations.  So therefore major depressive disorder symptoms her resolving.      Much better job on the patient's part of stopping negative/automatic thoughts.    Tried to change the dosing to 0.5mg  po tid klonopin and felt "drunk" after the middle dose. Antidepressant is working she feels! Not crying over the last few days. Has been taking klonopin 0.25mg  qam and 0.50mg  po qhs.  No full-blown panic attacks but has some limited symptom intact.  No interdose withdrawal issues.  Generalized anxiety symptoms are at a low level.  Wants to taper the Klonopin and I agree it is time to go to 0.25 mg b.i.d..      Substance use: Denies any substance use.    Review of systems:   Constitutional: Denies any medication side effect.  GI: Denies any diarrhea, constipation.  Dermatological:  Denies any skin rash    Medical and Surgical history in the interim: Non-contributory.  Past Medical History:   Diagnosis Date   . AAA (abdominal aortic aneurysm) (CMS HCC)    . Back ache    . Bowel trouble     obstruction/nonsurgical   . Chronic headaches    . Constipation    . Depression    . Fibromyalgia    . Incomplete defecation    . Osteoarthritis    . Rectocele    . Splenic artery aneurysm (CMS [facility])        Past Surgical History:   Procedure Laterality Date   . HX APPENDECTOMY     . HX CESAREAN SECTION      2   . HX CHOLECYSTECTOMY     . HX  COLONOSCOPY     . HX LAP CHOLECYSTECTOMY  2012   . HX SINUS SURGERY  2014   . HX WISDOM TEETH EXTRACTION         Current Outpatient Medications   Medication Sig Dispense Refill   . clonazePAM (KLONOPIN) 0.5 mg Oral Tablet One-half tab po bid. Indications: panic disorder 30 Tablet 1   . meloxicam (MOBIC) 15 mg Oral Tablet Take 1 Tablet (15 mg total) by mouth Once a day 90 Tablet 3   . Mirtazapine (REMERON) 7.5 mg Oral Tablet Take 1 Tablet (7.5 mg total) by mouth Every night 30 Tablet 2     No current facility-administered medications for this visit.        Allergies: No Known Allergies    Exam:      BP 138/64   Pulse 87   Resp 16   Ht 1.549 m (5\' 1" )   Wt 58.1 kg (128 lb)   SpO2 98%   BMI 24.19 kg/m       General appearance: Well dressed, well groomed. Pleasant.  Musculoskeletal: Normal gait and station.  Normal muscle strength and tone.  Speech: Articulate, appropriate with spontaneous elaborations  Affect: Congruent with mood which is described as "getting better".  Thought process: Logical, linear and goal directed.  Association: Intact  Abnormal thought: None  /Denies hopelessness, death wishes or suicidal ideations.  Denies auditory or visual hallucinations or ideas of reference.  Denies violent thoughts.  Judgement and Insight: Partial/  Improving.  Attention and concentration: Adequate  Language: Fluent use of English.    Assessment: Diagnosis: unchanged.     Psychiatrically improving.    Substance use is not a concern. No legal issues. No acute suicide risk. Discussed status and reviewed options for therapy. Discussed medications, psychotherapy and structures/schedule of program. Reviewed plan, goals, rationale, risks, benefits, and alternatives. Patient indicates understanding and agreement. Has decision making capacity.        ICD-10-CM    1. Generalized anxiety disorder  F41.1    2. 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  Decrease Klonopin to 0.25 mg p.o. b.i.d.Marland Kitchen  Therapy:  Continue current supportive work.  RTC:   Four weeks.  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  Over 50% of her time was spent on counseling, supportive psychotherapy and patient Education.    Latina Craver, MD      This note was partially created using voice recognition software and is inherently subject to errors including those of syntax and "sound-alike" substitutions which may escape proofreading.  In such instances, original meaning may be extrapolated by contextual derivation.

## 2020-06-10 NOTE — Nursing Note (Signed)
06/10/20 1504   PHQ 9 (follow up)   Little interest or pleasure in doing things. 1   Feeling down, depressed, or hopeless 0   PHQ 2 Total 1   Trouble falling or staying asleep, or sleeping too much. 0   Feeling tired or having little energy 0   Poor appetite or overeating 0   Feeling bad about yourself/ that you are a failure in the past 2 weeks? 0   Trouble concentrating on things in the past 2 weeks? 1   Moving/Speaking slowly or being fidgety or restless  in the past 2 weeks? 3   Thoughts that you would be better off DEAD, or of hurting yourself in some way. 0   If you checked off any problems, how difficult have these problems made it for you to do your work, take care of things at home, or get along with other people? Somewhat difficult   PHQ 9 Total 5   Interpretation of Total Score Mild depression

## 2020-06-20 ENCOUNTER — Other Ambulatory Visit: Payer: Self-pay

## 2020-06-20 ENCOUNTER — Encounter (INDEPENDENT_AMBULATORY_CARE_PROVIDER_SITE_OTHER): Payer: Self-pay | Admitting: Physician Assistant

## 2020-06-20 ENCOUNTER — Ambulatory Visit (INDEPENDENT_AMBULATORY_CARE_PROVIDER_SITE_OTHER): Payer: HMO | Admitting: Physician Assistant

## 2020-06-20 VITALS — BP 124/70 | HR 75 | Temp 98.0°F | Resp 12 | Ht 61.0 in | Wt 134.2 lb

## 2020-06-20 DIAGNOSIS — F410 Panic disorder [episodic paroxysmal anxiety] without agoraphobia: Secondary | ICD-10-CM

## 2020-06-20 DIAGNOSIS — F411 Generalized anxiety disorder: Secondary | ICD-10-CM

## 2020-06-20 DIAGNOSIS — F320 Major depressive disorder, single episode, mild: Secondary | ICD-10-CM

## 2020-06-20 NOTE — Progress Notes (Signed)
FAMILY MEDICINE, [facility]  [address]  [place] [address]       Name: Elizabeth Hunter  MRN:  [id number]       Date: 06/20/2020  DOB:      Age: 80 y.o.             Reason for Visit: Follow Up (anxiety)    History of Present Illness  Elizabeth Hunter is a 80 y.o. female who is being seen today for anxiety. States she is going to her psychiatrist and has up coming appointment with therapist on 07/02/20. States she still feels the anxiety is not controlled. Pt denies SI. Pt denies HI.      Past Medical History:   Diagnosis Date   . AAA (abdominal aortic aneurysm) (CMS HCC)    . Back ache    . Bowel trouble     obstruction/nonsurgical   . Chronic headaches    . Constipation    . Depression    . Fibromyalgia    . Incomplete defecation    . Osteoarthritis    . Rectocele    . Splenic artery aneurysm (CMS [facility])          Past Surgical History:   Procedure Laterality Date   . HX APPENDECTOMY     . HX CESAREAN SECTION      2   . HX CHOLECYSTECTOMY     . HX COLONOSCOPY     . HX LAP CHOLECYSTECTOMY  2012   . HX SINUS SURGERY  2014   . HX WISDOM TEETH EXTRACTION           Current Outpatient Medications   Medication Sig   . clonazePAM (KLONOPIN) 0.5 mg Oral Tablet One-half tab po bid. Indications: panic disorder   . meloxicam (MOBIC) 15 mg Oral Tablet Take 1 Tablet (15 mg total) by mouth Once a day   . Mirtazapine (REMERON) 7.5 mg Oral Tablet Take 1 Tablet (7.5 mg total) by mouth Every night     No Known Allergies  Family Medical History:     Problem Relation (Age of Onset)    Heart Attack Father (80)    Hypertension (High Blood Pressure) Mother (60)          Social History     Tobacco Use   . Smoking status: Never Smoker   . Smokeless tobacco: Never Used   Vaping Use   . Vaping Use: Never used   Substance Use Topics   . Alcohol use: No   . Drug use: Yes     Types: Benzodiazepines       Nursing Notes  There are no exam notes on file for this visit.     Review of Systems  Review of Systems    Constitutional: Negative for chills, fever, malaise/fatigue and weight loss.   HENT: Negative for congestion, ear discharge, ear pain, hearing loss, nosebleeds, sinus pain and sore throat.    Eyes: Negative for blurred vision, photophobia and discharge.   Respiratory: Negative for cough, hemoptysis, sputum production, shortness of breath and wheezing.    Cardiovascular: Negative for chest pain, palpitations and leg swelling.   Gastrointestinal: Negative for abdominal pain, blood in stool, constipation, diarrhea, heartburn, nausea and vomiting.   Genitourinary: Negative for dysuria, frequency, hematuria and urgency.   Musculoskeletal: Positive for back pain and joint pain. Negative for falls, myalgias and neck pain.   Skin: Negative for itching and rash.   Neurological: Positive for  dizziness. Negative for tingling, tremors, weakness and headaches.   Endo/Heme/Allergies: Negative for environmental allergies. Does not bruise/bleed easily.   Psychiatric/Behavioral: Positive for depression. Negative for memory loss, substance abuse and suicidal ideas. The patient is nervous/anxious. The patient does not have insomnia.        Physical Exam:  BP 124/70   Pulse 75   Temp 36.7 C (98 F)   Resp 12   Ht 1.549 m (5\' 1" )   Wt 60.9 kg (134 lb 3.2 oz)   SpO2 97%   BMI 25.36 kg/m       Physical Exam  Vitals and nursing note reviewed.   Constitutional:       General: She is not in acute distress.     Appearance: She is not diaphoretic.   HENT:      Head: Normocephalic and atraumatic.   Eyes:      Conjunctiva/sclera: Conjunctivae normal.      Pupils: Pupils are equal, round, and reactive to light.   Cardiovascular:      Rate and Rhythm: Normal rate and regular rhythm.      Heart sounds: Normal heart sounds.   Pulmonary:      Effort: Pulmonary effort is normal.      Breath sounds: Normal breath sounds.   Musculoskeletal:         General: Normal range of motion.   Skin:     General: Skin is warm and dry.   Neurological:       Mental Status: She is alert and oriented to person, place, and time.      Coordination: Coordination normal.      Gait: Gait is intact.   Psychiatric:         Mood and Affect: Mood and affect normal.         Cognition and Memory: Memory normal.         Judgment: Judgment normal.         Assessment and Plan    ENCOUNTER DIAGNOSES     ICD-10-CM   1. Generalized anxiety disorder with panic attacks  F41.1    F41.0   2. 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     Continue with current medications  Stay active  Balance diet  Keep up coming appointments with mental health     Follow up: Return in about 4 weeks (around 07/18/2020), or if symptoms worsen or fail to improve.      This patient was seen independently.    07/20/2020, PA-C  06/20/2020, 13:43

## 2020-06-23 ENCOUNTER — Telehealth (INDEPENDENT_AMBULATORY_CARE_PROVIDER_SITE_OTHER): Payer: Self-pay | Admitting: PSYCHIATRY

## 2020-06-23 ENCOUNTER — Encounter (INDEPENDENT_AMBULATORY_CARE_PROVIDER_SITE_OTHER): Payer: Self-pay | Admitting: PSYCHIATRY

## 2020-06-23 NOTE — Telephone Encounter (Signed)
Elizabeth Hunter called wanting me to let you know that her husband passed away two days ago. States that she is a "basketcase"  States she is taking her medication as prescribed but is having "a pretty rough time". States she doesn't want on more medications but feels like she needs something to help her through this. Asking for a call back if you have time #[phone]. However, states she will be at the funeral home for several hours today.

## 2020-06-23 NOTE — Telephone Encounter (Signed)
Lm for pt

## 2020-06-28 ENCOUNTER — Emergency Department (HOSPITAL_COMMUNITY): Payer: HMO

## 2020-06-28 ENCOUNTER — Emergency Department
Admission: EM | Admit: 2020-06-28 | Discharge: 2020-06-28 | Disposition: A | Discharge status: Home / Self Care | Payer: HMO | Attending: Medical | Admitting: Medical

## 2020-06-28 ENCOUNTER — Other Ambulatory Visit: Payer: Self-pay

## 2020-06-28 DIAGNOSIS — M542 Cervicalgia: Secondary | ICD-10-CM

## 2020-06-28 DIAGNOSIS — I6529 Occlusion and stenosis of unspecified carotid artery: Secondary | ICD-10-CM | POA: Insufficient documentation

## 2020-06-28 LAB — CBC WITH DIFF
BASOPHIL #: 0 10*3/uL (ref 0.00–0.20)
BASOPHIL %: 0 % (ref 0–2)
EOSINOPHIL #: 0.1 10*3/uL (ref 0.00–0.60)
EOSINOPHIL %: 1 % (ref 0–5)
HCT: 37.9 % (ref 36.0–48.0)
HGB: 13 g/dL (ref 11.6–14.8)
LYMPHOCYTE #: 0.6 10*3/uL — ABNORMAL LOW (ref 1.10–3.80)
LYMPHOCYTE %: 7 % — ABNORMAL LOW (ref 19–46)
MCH: 31.8 pg (ref 24.4–34.0)
MCHC: 34.3 g/dL (ref 30.0–37.0)
MCV: 92.7 fL — ABNORMAL HIGH (ref 79.0–88.0)
MONOCYTE #: 0.4 10*3/uL (ref 0.10–0.80)
MONOCYTE %: 5 % (ref 4–12)
MPV: 8.5 fL (ref 7.5–11.5)
NEUTROPHIL #: 8.1 10*3/uL — ABNORMAL HIGH (ref 1.80–7.50)
NEUTROPHIL %: 88 % — ABNORMAL HIGH (ref 41–69)
PLATELETS: 194 10*3/uL (ref 130–400)
RBC: 4.09 10*6/uL (ref 3.50–5.50)
RDW: 12.4 % (ref 11.5–14.0)
WBC: 9.3 10*3/uL (ref 4.5–11.5)

## 2020-06-28 LAB — TROPONIN-I: TROPONIN I: <0.01 ng/mL (ref 0.00–0.03)

## 2020-06-28 LAB — BASIC METABOLIC PANEL
ANION GAP: 9 mmol/L (ref 5–19)
BUN/CREA RATIO: 31 — ABNORMAL HIGH (ref 6–20)
BUN: 21 mg/dL — ABNORMAL HIGH (ref 7–17)
CALCIUM: 9.2 mg/dL (ref 8.4–10.2)
CHLORIDE: 107 mmol/L (ref 98–107)
CO2 TOTAL: 23 mmol/L (ref 22–30)
CREATININE: 0.68 mg/dL (ref 0.52–1.00)
ESTIMATED GFR: >60 mL/min/{1.73_m2} (ref >60)
GLUCOSE: 104 mg/dL (ref 74–106)
POTASSIUM: 3.8 mmol/L (ref 3.5–5.1)
SODIUM: 139 mmol/L (ref 137–145)

## 2020-06-28 LAB — ECG 12 LEAD: Ventricular rate: 116 {beats}/min

## 2020-06-28 LAB — GOLD TOP TUBE

## 2020-06-28 MED ORDER — IOPAMIDOL 300 MG IODINE/ML (61 %) INTRAVENOUS SOLUTION
120.0000 mL | INTRAVENOUS | Status: AC
Start: 2020-06-28 — End: 2020-06-28
  Administered 2020-06-28 (×2): 120 mL via INTRAVENOUS

## 2020-06-28 MED ORDER — IOPAMIDOL 300 MG IODINE/ML (61 %) INTRAVENOUS SOLUTION
120.0000 mL | INTRAVENOUS | Status: DC
Start: 2020-06-28 — End: 2020-06-28

## 2020-06-28 NOTE — ED Provider Notes (Signed)
[facility]  Emergency Department      Name: Elizabeth Hunter  Age and Gender: 80 y.o. female  Date of Birth:   Date of Service: 06/28/2020   MRN: [id number]  PCP: Londell Moh, DO    Chief Complaint   Patient presents with   . Neck Pain       HPI:    Elizabeth Hunter is a 80 y.o. female presenting with right neck pain x today. 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 Donnie Mesa every 6 months. States sharp pain that she has never felt before. Also complains of heart rate of 140. Denies cp, sob, fever, and any other symptoms.         ROS:  All systems reviewed and are negative, unless stated in the HPI.      Below pertinent information reviewed with patient and/or EMR:  Past Medical History:   Diagnosis Date   . AAA (abdominal aortic aneurysm) (CMS HCC)    . Back ache    . Bowel trouble     obstruction/nonsurgical   . Chronic headaches    . Constipation    . Depression    . Fibromyalgia    . Incomplete defecation    . Osteoarthritis    . Rectocele    . Splenic artery aneurysm (CMS HCC)      Medications Prior to Admission     Prescriptions    clonazePAM (KLONOPIN) 0.5 mg Oral Tablet    One-half tab po bid. Indications: panic disorder    meloxicam (MOBIC) 15 mg Oral Tablet    Take 1 Tablet (15 mg total) by mouth Once a day    Mirtazapine (REMERON) 7.5 mg Oral Tablet    Take 1 Tablet (7.5 mg total) by mouth Every night        No Known Allergies  Past Surgical History:   Procedure Laterality Date   . HX APPENDECTOMY     . HX CESAREAN SECTION      2   . HX CHOLECYSTECTOMY     . HX COLONOSCOPY     . HX LAP CHOLECYSTECTOMY  2012   . [place] SINUS SURGERY  2014   . HX WISDOM TEETH EXTRACTION       Family Medical History:     Problem Relation (Age of Onset)    Heart Attack Father (59)    Hypertension (High Blood Pressure) Mother (35)        Social History     Tobacco Use   . Smoking status: Never Smoker   . Smokeless tobacco: Never Used   Vaping Use   . Vaping Use: Never used   Substance Use Topics   . Alcohol use: No    . Drug use: Yes     Types: Benzodiazepines       Objective:  ED Triage Vitals [06/28/20 0139]   BP (Non-Invasive) (!) 162/84   Heart Rate (!) 120   Respiratory Rate 16   Temperature 37.7 C (99.8 F)   SpO2 94 %   Weight 58.1 kg (128 lb)   Height 1.549 m (_0 )     Nursing notes and vital signs reviewed.    Constitutional:  No acute distress.  Alert and oriented to person, place, and time.   HENT:   Head: Normocephalic and atraumatic.   Mouth/Throat: Oropharynx is clear and moist.   Eyes: EOMI, PERRL   Neck: Trachea midline. Neck supple.  Cardiovascular: RRR, No murmurs, rubs or gallops. Intact  distal pulses.  Pulmonary/Chest: BS equal bilaterally. No respiratory distress. No wheezes, rales or chest tenderness.  No tachypnea, retractions or accessory muscle use.  Abdominal: BS +. Abdomen soft, no tenderness, rebound or guarding.  Back: No midline spinal tenderness, no paraspinal tenderness, no CVA tenderness.           Musculoskeletal: No edema, tenderness or deformity.  Strength 5/5 in all extremities  Skin: warm and dry. No rash, erythema, pallor or cyanosis  Psychiatric: normal mood and affect. Behavior is normal.   Neurological: Patient keenly alert and responsive, CN II-XII grossly intact, moving all extremities equally and fully, normal gait    Labs:   Labs Reviewed   BASIC METABOLIC PANEL - Abnormal; Notable for the following components:       Result Value    BUN 21 (*)     BUN/CREA RATIO 31 (*)     All other components within normal limits    Narrative:     HEM:<15,ICT:<2,TURB:<20  Estimated Glomerular Filtration Rate (eGFR) calculated using the CKD-EPI (2009) equation, intended for patients 37 years of age and older. If race and/or gender is not documented or "unknown," there will be no eGFR calculation.   CBC WITH DIFF - Abnormal; Notable for the following components:    MCV 92.7 (*)     NEUTROPHIL % 88 (*)     LYMPHOCYTE % 7 (*)     NEUTROPHIL # 8.10 (*)     LYMPHOCYTE # 0.60 (*)     All other  components within normal limits   TROPONIN-I - Normal    Narrative:     HEM:<15,ICT:<2,TURB:<20   CBC/DIFF    Narrative:     The following orders were created for panel order CBC/DIFF.  Procedure                               Abnormality         Status                     ---------                               -----------         ------                     CBC WITH LEXN[[id number]]                Abnormal            Final result                 Please view results for these tests on the individual orders.   EXTRA TUBES    Narrative:     The following orders were created for panel order EXTRA TUBES.  Procedure                               Abnormality         Status                     ---------                               -----------         ------  GOLD TOP QTMA[[id number]]                                    In process                   Please view results for these tests on the individual orders.   GOLD TOP TUBE       Imaging:  No orders to display       EKG: sinus tachycardia, no acute ischemic changes     MDM/Course:  Workup negative. F/u with PCP. Return to ED with new or worsening symptoms.            Clinical Impression:     Encounter Diagnosis   Name Primary?   . Neck pain on right side Yes       Medications given:  Medications Administered in the ED   iopamidol (ISOVUE-300) 61% infusion (120 mL Intravenous Given 06/28/20 0341)           Disposition: Discharged       Current Discharge Medication List      CONTINUE these medications - NO CHANGES were made during your visit.      Details   clonazePAM 0.5 mg Tablet  Commonly known as: KLONOPIN   One-half tab po bid.  Qty: 30 Tablet  Refills: 1     meloxicam 15 mg Tablet  Commonly known as: MOBIC   15 mg, Oral, DAILY  Qty: 90 Tablet  Refills: 3     Mirtazapine 7.5 mg Tablet  Commonly known as: REMERON   7.5 mg, Oral, NIGHTLY  Qty: 30 Tablet  Refills: 2            Follow up:    Londell Moh, DO  [place]  [id number]  [phone]          [address], [place] [address]  [place] [address]  [phone]            Parts of this patients chart were completed in a retrospective fashion due to simultaneous direct patient care activities in the Emergency Department.   This note was partially generated using MModal Fluency Direct system, and there may be some incorrect words, spellings, and punctuation that were not noted in checking the note before saving.

## 2020-06-28 NOTE — ED Triage Notes (Addendum)
Rt side neck pain intermittent x today. Pt denies injury. Pain radiates down rt arm. Pt denies CP/SOB. Pt reports her husband and best friend  recently died and she has been "anxious" since their passing.

## 2020-07-01 LAB — ECG 12 LEAD
Calculated P Axis: 66 degrees
Calculated R Axis: 37 degrees
Calculated T Axis: 51 degrees
PR Interval: 156 ms
QRS Duration: 82 ms
QT Interval: 332 ms
QTC Calculation: 401 ms

## 2020-07-02 ENCOUNTER — Telehealth (INDEPENDENT_AMBULATORY_CARE_PROVIDER_SITE_OTHER): Payer: Self-pay | Admitting: PSYCHIATRY

## 2020-07-02 NOTE — Telephone Encounter (Signed)
Edie called, tearful stating she really needs your help. States she is not dealing with anything well since her husband passed away. States that she had her first therapy session and the therapist recommended her call and see if any medications can be changed or adjusted. She states "I'm just in a terrible state. I don't know how I'm to be able to make it through, please tell Dr. Renae Fickle I really need his help."

## 2020-07-02 NOTE — Telephone Encounter (Signed)
Informed pt there are no medication changes at this time and recommended grief counseling. I did offer to move her appt to an open spot tomorrow but she declined stating that she is coming in next week.

## 2020-07-07 ENCOUNTER — Ambulatory Visit (INDEPENDENT_AMBULATORY_CARE_PROVIDER_SITE_OTHER): Payer: HMO | Admitting: PSYCHIATRY

## 2020-07-07 ENCOUNTER — Other Ambulatory Visit: Payer: Self-pay

## 2020-07-07 ENCOUNTER — Encounter (INDEPENDENT_AMBULATORY_CARE_PROVIDER_SITE_OTHER): Payer: Self-pay | Admitting: PSYCHIATRY

## 2020-07-07 VITALS — BP 140/74 | HR 94 | Resp 16 | Ht 61.0 in | Wt 128.0 lb

## 2020-07-07 DIAGNOSIS — F411 Generalized anxiety disorder: Secondary | ICD-10-CM

## 2020-07-07 DIAGNOSIS — Z6824 Body mass index (BMI) 24.0-24.9, adult: Secondary | ICD-10-CM

## 2020-07-07 DIAGNOSIS — F3341 Major depressive disorder, recurrent, in partial remission: Secondary | ICD-10-CM

## 2020-07-07 MED ORDER — MIRTAZAPINE 15 MG TABLET
15.0000 mg | ORAL_TABLET | Freq: Every evening | ORAL | 2 refills | Status: DC
Start: 2020-07-07 — End: 2020-07-22

## 2020-07-07 NOTE — Progress Notes (Signed)
[facility]   [address]  [place] [address]       Name: Elizabeth Hunter MRN:  [id number]   Date: 07/07/2020 Age: 80 y.o.       History:        Last visit:  06/10/2020    Interval History: Came in today with daughter and grandson. Who have been up for three weeks for husband's funeral. "I am so scared".  The patient has been very focused on her anxiety and also is blaming the Klonopin for her poor quality of life "I could not drive him in when he had some chest discomfort because of the Klonopin".  By the time she called her grandson and he made it home the patient's  husband had died within minutes.     We discussed her treatment plan and she will need to cut to 0.125 mg Klonopin b.i.d. for 7 days and then stop permanently.  This patient should never be on benzodiazepines again.  Daughter said the patient has been more depressed recently and even had some hopelessness statements and thought she might be better off dead.  We discussed options and will increase her Remeron to 15 mg at HS      Substance use: Denies any substance use.    Review of systems:   Constitutional: Denies any medication side effect.  GI: Denies any diarrhea, constipation.  Dermatological:  Denies any skin rash    Medical and Surgical history in the interim: Non-contributory.  Past Medical History:   Diagnosis Date   . AAA (abdominal aortic aneurysm) (CMS HCC)    . Back ache    . Bowel trouble     obstruction/nonsurgical   . Chronic headaches    . Constipation    . Depression    . Fibromyalgia    . Incomplete defecation    . Osteoarthritis    . Rectocele    . Splenic artery aneurysm (CMS [facility])        Past Surgical History:   Procedure Laterality Date   . HX APPENDECTOMY     . HX CESAREAN SECTION      2   . HX CHOLECYSTECTOMY     . HX COLONOSCOPY     . HX LAP CHOLECYSTECTOMY  2012   . HX SINUS SURGERY  2014   . HX WISDOM TEETH EXTRACTION         Current Outpatient Medications   Medication  Sig Dispense Refill   . clonazePAM (KLONOPIN) 0.5 mg Oral Tablet One-half tab po bid. Indications: panic disorder 30 Tablet 1   . meloxicam (MOBIC) 15 mg Oral Tablet Take 1 Tablet (15 mg total) by mouth Once a day 90 Tablet 3   . Mirtazapine (REMERON) 7.5 mg Oral Tablet Take 1 Tablet (7.5 mg total) by mouth Every night 30 Tablet 2     No current facility-administered medications for this visit.        Allergies: No Known Allergies    Exam:      BP (!) 140/74   Pulse 94   Resp 16   Ht 1.549 m (5\' 1" )   Wt 58.1 kg (128 lb)   SpO2 95%   BMI 24.19 kg/m       General appearance: Well dressed, well groomed. Cooperative.  Musculoskeletal: Normal gait and station. Normal muscle strength and tone.  Speech: Articulate, appropriate with spontaneous elaborations  Affect: Congruent with mood which is described as "  all over the place".  Thought process: Logical, linear and goal directed.  Association: Intact  Abnormal thought: None and denies SI/HI or psychotic sx. Denies death wishes.  Judgement and Insight: Partial/ Limited.  Attention and concentration: Adequate  Language: Fluent use of English.    Assessment: Diagnosis: unchanged.   Grieving and more depressed recently.    Substance use is not a concern. No legal issues. No acute suicide risk. Discussed status and reviewed options for therapy. Discussed medications, psychotherapy and structures/schedule of program. Reviewed plan, goals, rationale, risks, benefits, and alternatives. Patient indicates understanding and agreement. Has decision making capacity.        ICD-10-CM    1. Generalized anxiety disorder  F41.1    2. Recurrent major depressive disorder, in partial remission (CMS HCC)  F33.41         Plan:   Medications: Decrease the klonopin to 0.125mg  po bid for 7 days and then STOP it. Increase the remeron to 15mg  po qhs.  Therapy:  Continue current supportive work.  RTC:  4 weeks.  Labs/test/imagining: None   I spent approximately 30 minutes on this case today  with the patient present over 66% of that time and we discussed her treatment plan, issues, medications, potential medication adverse effects including the addictive potential of Klonopin and how it will impair her performance at operating machinery or walking / balance.  Over 50% of her time was spent on counseling, supportive psychotherapy and patient Education.      , MD      This note was partially created using voice recognition software and is inherently subject to errors including those of syntax and "sound-alike" substitutions which may escape proofreading.  In such instances, original meaning may be extrapolated by contextual derivation.

## 2020-07-07 NOTE — Nursing Note (Signed)
07/07/20 1356   PHQ 9 (follow up)   Little interest or pleasure in doing things. 3   Feeling down, depressed, or hopeless 3   PHQ 2 Total 6   Trouble falling or staying asleep, or sleeping too much. 3   Feeling tired or having little energy 3   Poor appetite or overeating 3   Feeling bad about yourself/ that you are a failure in the past 2 weeks? 0   Trouble concentrating on things in the past 2 weeks? 1   Moving/Speaking slowly or being fidgety or restless  in the past 2 weeks? 3   Thoughts that you would be better off DEAD, or of hurting yourself in some way. 0   If you checked off any problems, how difficult have these problems made it for you to do your work, take care of things at home, or get along with other people? Very difficult   PHQ 9 Total 19   Interpretation of Total Score Moderate/Severe depression

## 2020-07-07 NOTE — Nursing Note (Signed)
07/07/20 1355   [place] Suicide Severity Rating Scale (Since Last Contact Screener)   1. Wish to be Dead (Since Last Contact) N   2. Non-Specific Active Suicidal Thoughts (Since Last Contact) N   6. Suicidal Behavior (Since Last Contact) N   Calculated C-SSRS Risk Score (Since Last Contact) No Risk Indicated

## 2020-07-08 ENCOUNTER — Encounter (INDEPENDENT_AMBULATORY_CARE_PROVIDER_SITE_OTHER): Payer: Self-pay | Admitting: VASCULAR SURGERY

## 2020-07-08 ENCOUNTER — Ambulatory Visit (INDEPENDENT_AMBULATORY_CARE_PROVIDER_SITE_OTHER): Payer: HMO | Admitting: VASCULAR SURGERY

## 2020-07-08 ENCOUNTER — Inpatient Hospital Stay (INDEPENDENT_AMBULATORY_CARE_PROVIDER_SITE_OTHER)
Admission: RE | Admit: 2020-07-08 | Discharge: 2020-07-08 | Disposition: A | Discharge status: Home / Self Care | Payer: HMO | Source: Ambulatory Visit

## 2020-07-08 VITALS — BP 151/77 | HR 80 | Ht 61.0 in | Wt 129.7 lb

## 2020-07-08 DIAGNOSIS — I714 Abdominal aortic aneurysm, without rupture, unspecified: Secondary | ICD-10-CM

## 2020-07-08 DIAGNOSIS — I77819 Aortic ectasia, unspecified site: Secondary | ICD-10-CM

## 2020-07-08 DIAGNOSIS — I728 Aneurysm of other specified arteries: Secondary | ICD-10-CM

## 2020-07-08 NOTE — Progress Notes (Signed)
Department of Vascular Surgery  [facility]  Outpatient Clinic History and Physical    Date: 07/08/2020  Patient: Elizabeth Hunter  DOB:   PCP: Cecilie Kicks, DO    Chief Complaint:   Chief Complaint   Patient presents with   . Follow Up     Splenic artery aneurysm  ABD aortic aneurysm   Testing prior       Subjective:     HPI: Elizabeth Hunter is a 80 y.o. White female who presents for evaluation of AAA and splenic artery aneurysm.  Mother died of a ruptured AAA  She has a yearly scan  No real changes today    Complains of chronic back pain  No claudication, tissue loss or rest pain  No flank tenderness    Husband died to weeks ago    PMH:   Past Medical History:   Diagnosis Date   . AAA (abdominal aortic aneurysm) (CMS HCC)    . Back ache    . Bowel trouble     obstruction/nonsurgical   . Chronic headaches    . Constipation    . Depression    . Fibromyalgia    . Incomplete defecation    . Osteoarthritis    . Rectocele    . Splenic artery aneurysm (CMS HCC)            Family Hx:  Family Medical History:     Problem Relation (Age of Onset)    Heart Attack Father (28)    Hypertension (High Blood Pressure) Mother (66)            Medications:  Current Outpatient Medications   Medication Sig Dispense Refill   . meloxicam (MOBIC) 15 mg Oral Tablet Take 1 Tablet (15 mg total) by mouth Once a day 90 Tablet 3   . Mirtazapine (REMERON) 15 mg Oral Tablet Take 1 Tablet (15 mg total) by mouth Every night Indications: major depressive disorder 30 Tablet 2     No current facility-administered medications for this visit.       Allergies:  No Known Allergies    Past Surgical Hx:  Past Surgical History:   Procedure Laterality Date   . HX APPENDECTOMY     . HX CESAREAN SECTION      2   . HX CHOLECYSTECTOMY     . HX COLONOSCOPY     . HX LAP CHOLECYSTECTOMY  2012   . HX SINUS SURGERY  2014   . HX WISDOM TEETH EXTRACTION             Social Hx:  Social History     Socioeconomic History   . Marital status: Married     Spouse  name: Not on file   . Number of children: Not on file   . Years of education: Not on file   . Highest education level: Not on file   Occupational History   . Occupation: SELF     Comment: [employer]   Tobacco Use   . Smoking status: Never Smoker   . Smokeless tobacco: Never Used   Vaping Use   . Vaping Use: Never used   Substance and Sexual Activity   . Alcohol use: No   . Drug use: Yes     Types: Benzodiazepines   . Sexual activity: Not Currently     Comment: did not ask   Other Topics Concern   . Abuse/Domestic Violence Not Asked   . Breast Self Exam Not  Asked   . Caffeine Concern Not Asked   . Calcium intake adequate Not Asked   . Computer Use Not Asked   . Drives Yes   . Exercise Concern Not Asked   . Helmet Use Not Asked   . Seat Belt Not Asked   . Special Diet Not Asked   . Sunscreen used Not Asked   . Uses Cane Yes   . Uses walker Yes   . Uses wheelchair Yes   . Right hand dominant Yes   . Left hand dominant Not Asked   . Ambidextrous Not Asked   . Shift Work Not Asked   . Unusual Sleep-Wake Schedule Not Asked   Social History Narrative   . Not on file     Social Determinants of Health     Financial Resource Strain: Not on file   Food Insecurity: Not on file   Transportation Needs: Not on file   Physical Activity: Not on file   Stress: Not on file   Intimate Partner Violence: Not on file   Housing Stability: Not on file       Review of Systems:  Constitutional: negative for fevers, chills, sweats and fatigue  Eyes: negative for blurred vision or field defects  HEENT: negative for hearing loss  Respiratory: negative for cough and shortness of breath  Cardiovascular: negative for chest pain, negative for claudication  Gastrointestinal: negative for post prandial pain  Genitourinary: negative for dysuria, continues to urinate  Musculoskeletal: severe back pain - chronic  Neurological: negative for unilateral weakness or monocular blindness  Integumentary: negative for wounds or ulcers    Objective:    PHYSICAL  EXAM:  Vitals: BP (!) 151/77 (Site: Left, Patient Position: Sitting)   Pulse 80   Ht 1.549 m (5\' 1" )   Wt 58.8 kg (129 lb 11.2 oz)   SpO2 95%   BMI 24.51 kg/m       General: AA&O X3. Nontoxic.  Well developed and well nourished in no acute distress   HENT: Head is normocephalic, atraumatic, oropharynx is moist  Eyes: Pupils equal and round and reactive to light; conjunctiva clear. Sclera without icterus; EOMO grossly intact.    Neck: Normal ROM, Supple, symmetrical  Lungs: Effort normal, clear to auscultation bilaterally.   Cardiovascular: Heart regular rate and rhythm, S1, S2 normal, no murmur, click, rub or gallop  Vascular:     Left carotid bruit:  absent, Right carotid bruit:  absent   Left brachial artery:  2+ (normal),  Right brachial artery:  2+ (normal),    Left radial artery:  2+ (normal),  Right radial artery:  2+ (normal),      Left femoral artery:  2+ (normal),  Right femoral artery:  2+ (normal),      Left popliteal artery: non-aneurysmal, Right popliteal artery:  non-aneurysmal   Left dorsalis pedis artery:  2+ (normal), Right dorsalis pedis artery:   2+ (normal),     Left posterior tibial artery: 2+ (normal) Right posterior tibeal artery:   2+ (normal)   Abdomen: Bowel sounds normal; soft, non distended non-tender to palpation, no rebound or guarding present. No palpable masses.  Extremities: no cyanosis or edema  Skin:  Skin warm and dry, No rashes and No lesions      DATA:     I have independently reviewed and interpreted available imaging.  My interpretations are summarized below.      DIAGNOSTIC STUDIES REVIEWED:  Aortic ultrasound 2.5 cm aneurysm  1.4 cm  stable splenic artery aneurysm on KUB  No carotid duplex today    Assessment:  Small aortic aneurysm  1.4 cm splenic artery aneurysm  Carotid artery stenosis - asymptomatic    Plan:  Follow up KUB and aortic duplex in February  Carotid duplex already scheduled for February    Patient was given the opportunity to ask questions and those  questions were answered to their satisfaction. Instructed to call with any further questions or concerns.     Jabier Gauss, MD

## 2020-07-10 ENCOUNTER — Other Ambulatory Visit (INDEPENDENT_AMBULATORY_CARE_PROVIDER_SITE_OTHER): Payer: Self-pay | Admitting: Physician Assistant

## 2020-07-10 MED ORDER — MELOXICAM 15 MG TABLET
15.0000 mg | ORAL_TABLET | Freq: Every day | ORAL | 3 refills | Status: DC
Start: 2020-07-10 — End: 2021-05-21

## 2020-07-17 ENCOUNTER — Other Ambulatory Visit: Payer: Self-pay

## 2020-07-17 ENCOUNTER — Encounter (HOSPITAL_COMMUNITY): Payer: Self-pay

## 2020-07-17 ENCOUNTER — Inpatient Hospital Stay (HOSPITAL_COMMUNITY): Payer: HMO | Admitting: Emergency Medicine

## 2020-07-17 ENCOUNTER — Emergency Department
Admission: EM | Admit: 2020-07-17 | Discharge: 2020-07-18 | DRG: 897 | Disposition: A | Discharge status: Home / Self Care | Payer: HMO | Attending: Emergency Medicine | Admitting: Emergency Medicine

## 2020-07-17 DIAGNOSIS — Z20822 Contact with and (suspected) exposure to covid-19: Secondary | ICD-10-CM | POA: Diagnosis present

## 2020-07-17 DIAGNOSIS — G4700 Insomnia, unspecified: Secondary | ICD-10-CM | POA: Diagnosis present

## 2020-07-17 DIAGNOSIS — F13239 Sedative, hypnotic or anxiolytic dependence with withdrawal, unspecified: Secondary | ICD-10-CM | POA: Diagnosis present

## 2020-07-17 DIAGNOSIS — F13939 Sedative, hypnotic or anxiolytic use, unspecified with withdrawal, unspecified: Secondary | ICD-10-CM | POA: Diagnosis present

## 2020-07-17 DIAGNOSIS — M1990 Unspecified osteoarthritis, unspecified site: Secondary | ICD-10-CM | POA: Diagnosis present

## 2020-07-17 DIAGNOSIS — F329 Major depressive disorder, single episode, unspecified: Secondary | ICD-10-CM | POA: Diagnosis present

## 2020-07-17 DIAGNOSIS — M797 Fibromyalgia: Secondary | ICD-10-CM | POA: Diagnosis present

## 2020-07-17 DIAGNOSIS — R4589 Other symptoms and signs involving emotional state: Secondary | ICD-10-CM

## 2020-07-17 DIAGNOSIS — I714 Abdominal aortic aneurysm, without rupture: Secondary | ICD-10-CM | POA: Diagnosis present

## 2020-07-17 DIAGNOSIS — R45851 Suicidal ideations: Secondary | ICD-10-CM | POA: Diagnosis present

## 2020-07-17 DIAGNOSIS — Z79899 Other long term (current) drug therapy: Secondary | ICD-10-CM

## 2020-07-17 DIAGNOSIS — F410 Panic disorder [episodic paroxysmal anxiety] without agoraphobia: Secondary | ICD-10-CM | POA: Diagnosis present

## 2020-07-17 DIAGNOSIS — F32A Depression, unspecified: Secondary | ICD-10-CM

## 2020-07-17 DIAGNOSIS — F411 Generalized anxiety disorder: Secondary | ICD-10-CM | POA: Diagnosis present

## 2020-07-17 LAB — CBC WITH DIFF
BASOPHIL #: 0.1 10*3/uL (ref 0.00–0.30)
BASOPHIL %: 1 % (ref 0–1)
EOSINOPHIL #: 0.1 10*3/uL (ref 0.00–0.50)
EOSINOPHIL %: 2 % (ref 0–4)
HCT: 37 % (ref 37.0–47.0)
HGB: 12.7 g/dL (ref 12.5–16.0)
LYMPHOCYTE #: 1.7 10*3/uL (ref 0.90–4.80)
LYMPHOCYTE %: 22 % — ABNORMAL LOW (ref 23–35)
MCH: 31.7 pg (ref 28.0–33.0)
MCHC: 34.4 g/dL (ref 32.0–37.0)
MCV: 92.2 fL (ref 78.0–100.0)
MONOCYTE #: 0.5 10*3/uL (ref 0.30–0.90)
MONOCYTE %: 6 % (ref 0–12)
NEUTROPHIL #: 5.4 10*3/uL (ref 1.70–7.00)
NEUTROPHIL %: 70 % (ref 50–70)
PLATELETS: 261 10*3/uL (ref 130–400)
RBC: 4.01 10*6/uL — ABNORMAL LOW (ref 4.20–5.40)
RDW: 12.2 % (ref 9.9–16.5)
WBC: 7.8 10*3/uL (ref 4.5–11.0)

## 2020-07-17 LAB — DRUG SCREEN, NO CONFIRMATION, URINE
AMPHETAMINES, URINE: NEGATIVE
BARBITURATES URINE: NEGATIVE
BENZODIAZEPINES URINE: NEGATIVE
BUPRENORPHINE URINE: NEGATIVE
CANNABINOIDS URINE: POSITIVE — AB
COCAINE METABOLITES URINE: NEGATIVE
CREATININE RANDOM URINE: 28 mg/dL — ABNORMAL LOW (ref 50–100)
ECSTASY/MDMA URINE: NEGATIVE
FENTANYL, RANDOM URINE: NEGATIVE
METHADONE URINE: NEGATIVE
OPIATES URINE (LOW CUTOFF): NEGATIVE
OXYCODONE URINE: NEGATIVE

## 2020-07-17 LAB — COMPREHENSIVE METABOLIC PANEL, NON-FASTING
ALBUMIN: 4.2 g/dL (ref 3.4–4.8)
ALKALINE PHOSPHATASE: 61 U/L (ref 55–145)
ALT (SGPT): 15 U/L (ref 8–22)
ANION GAP: 8 mmol/L (ref 4–13)
AST (SGOT): 20 U/L (ref 8–45)
BILIRUBIN TOTAL: 0.6 mg/dL (ref 0.3–1.3)
BUN/CREA RATIO: 15 (ref 6–22)
BUN: 14 mg/dL (ref 8–25)
CALCIUM: 10 mg/dL (ref 8.8–10.2)
CHLORIDE: 103 mmol/L (ref 96–111)
CO2 TOTAL: 27 mmol/L (ref 23–31)
CREATININE: 0.93 mg/dL (ref 0.60–1.05)
ESTIMATED GFR: 63 mL/min/BSA (ref >=60)
GLUCOSE: 108 mg/dL (ref 65–125)
POTASSIUM: 3.9 mmol/L (ref 3.5–5.1)
PROTEIN TOTAL: 7.2 g/dL (ref 6.0–8.0)
SODIUM: 138 mmol/L (ref 136–145)

## 2020-07-17 LAB — COVID-19, FLU A/B, RSV RAPID BY PCR
INFLUENZA VIRUS TYPE A: NOT DETECTED
INFLUENZA VIRUS TYPE B: NOT DETECTED
RESPIRATORY SYNCTIAL VIRUS (RSV): NOT DETECTED
SARS-CoV-2: NOT DETECTED

## 2020-07-17 LAB — ETHANOL, SERUM: ETHANOL: NOT DETECTED

## 2020-07-17 LAB — ACETAMINOPHEN LEVEL: ACETAMINOPHEN LEVEL: <5 ug/mL — ABNORMAL LOW (ref 10–30)

## 2020-07-17 LAB — SALICYLATE ACID LEVEL: SALICYLATE LEVEL: <5 mg/dL — ABNORMAL LOW (ref 15–30)

## 2020-07-17 NOTE — ED Triage Notes (Addendum)
Pt arrives with friend for c/o detox evaluation and SI. Pt recently tried to take herself off her clonazepam 11 days ago and complains of multiple withdrawal symptoms. Pt very emotional in triage. Pt complaining of anxiety, abdominal pain, N/V. Pt's husband recently passed and states she is very depressed and having thoughts of SI.

## 2020-07-17 NOTE — ED Provider Notes (Signed)
Emergency Department  Provider Note    Name: Elizabeth Hunter      Subjective  History of Present Illness    Elizabeth Hunter is a 80 y.o. female  who presents to the ED today to be evaluated for severe depression, anxiety, and insomnia.  Patient states that she had been on Xanax for proximally three years but wanted to be weaned off of it.  She has been seen Elizabeth Hunter for this and was being weaned and then transition to Klonopin which she has also slowly been weaning off of.  She states that approximately three weeks ago her husband and since that time she feels like she is spiraling out of control.  She has been taking over-the-counter THC gummies to help with the symptoms of withdrawal.  She does not feel like she has anything to live for.        Review of Systems    ROS: Other than those specifically stated in the HPI and below, all other systems reviewed and negative.  Review of Systems       Historical Data   Below information reviewed with patient where pertinent:  Past Medical History:   Diagnosis Date   . AAA (abdominal aortic aneurysm) (CMS HCC)    . Back ache    . Bowel trouble     obstruction/nonsurgical   . Chronic headaches    . Constipation    . Depression    . Fibromyalgia    . Incomplete defecation    . Osteoarthritis    . Rectocele    . Splenic artery aneurysm (CMS HCC)        Current Outpatient Medications   Medication Sig   . meloxicam (MOBIC) 15 mg Oral Tablet Take 1 Tablet (15 mg total) by mouth Once a day   . Mirtazapine (REMERON) 15 mg Oral Tablet Take 1 Tablet (15 mg total) by mouth Every night Indications: major depressive disorder       No Known Allergies    Past Surgical History:   Procedure Laterality Date   . HX APPENDECTOMY     . HX CESAREAN SECTION      2   . HX CHOLECYSTECTOMY     . HX COLONOSCOPY     . HX LAP CHOLECYSTECTOMY  2012   . HX SINUS SURGERY  2014   . HX WISDOM TEETH EXTRACTION         Social History     Tobacco Use   . Smoking status: Never Smoker   . Smokeless tobacco:  Never Used   Vaping Use   . Vaping Use: Never used   Substance Use Topics   . Alcohol use: No   . Drug use: Yes     Types: Benzodiazepines              Objective  Physical Exam   Filed Vitals:    07/17/20 1808 07/18/20 0333   BP: 136/65 (!) 147/56   Pulse: 85 80   Resp: 18 20   Temp: 36.4 C (97.6 F)    SpO2: 96% 99%       Physical Exam  . Vital signs Reviewed.   . Constitutional: NAD.   Marland Kitchen HENT:   o Head: Normocephalic and atraumatic   o Mouth/Throat: Oropharynx is clear and moist   o Eyes: EOMI, conjunctivae without discharge bilaterally  o Neck: Trachea midline   . Cardiovascular: RRR, No murmurs, rubs or gallops   . Pulmonary/Chest: BS equal bilaterally,  good air movement. No respiratory distress. No wheezes, rales or chest tenderness   . Abdominal: BS +. Abdomen soft, no tenderness, rebound or guarding  . Musculoskeletal: No edema, tenderness or deformity  . Skin: warm and dry. No rash, erythema, pallor or cyanosis  . Psychiatric: normal mood and affect. Behavior is normal   . Neurological: Alert&Ox3. Grossly intact         Orders and Results  Patient Data    Labs Reviewed   ACETAMINOPHEN LEVEL - Abnormal; Notable for the following components:       Result Value    ACETAMINOPHEN LEVEL  <5 (*)     All other components within normal limits   SALICYLATE ACID LEVEL - Abnormal; Notable for the following components:    SALICYLATE LEVEL <5 (*)     All other components within normal limits   DRUG SCREEN, NO CONFIRMATION, URINE - Abnormal; Notable for the following components:    CANNABINOIDS URINE Positive (*)     CREATININE RANDOM URINE 28 (*)     All other components within normal limits   CBC WITH DIFF - Abnormal; Notable for the following components:    RBC 4.01 (*)     LYMPHOCYTE % 22 (*)     All other components within normal limits   COMPREHENSIVE METABOLIC PANEL, NON-FASTING - Normal   COVID-19, FLU A/B, RSV RAPID BY PCR - Normal    Narrative:     Results are for the simultaneous qualitative identification of  SARS-CoV-2 (formerly 2019-nCoV), Influenza A, Influenza B, and RSV RNA. These etiologic agents are generally detectable in nasopharyngeal and nasal swabs during the ACUTE PHASE of infection. Hence, this test is intended to be performed on respiratory specimens collected from individuals with signs and symptoms of upper respiratory tract infection who meet Centers for Disease Control and Prevention (CDC) clinical and/or epidemiological criteria for Coronavirus Disease 2019 (COVID-19) testing. CDC COVID-19 criteria for testing on human specimens is available at [facility] webpage information for Healthcare Professionals: Coronavirus Disease 2019 (COVID-19) ([email or web address]).     False-negative results may occur if the virus has genomic mutations, insertions, deletions, or rearrangements or if performed very early in the course of illness. Otherwise, negative results indicate virus specific RNA targets are not detected, however negative results do not preclude SARS-CoV-2 infection/COVID-19, Influenza, or Respiratory syncytial virus infection. Results should not be used as the sole basis for patient management decisions. Negative results must be combined with clinical observations, patient history, and epidemiological information. If upper respiratory tract infection is still suspected based on exposure history together with other clinical findings, re-testing should be considered.    Disclaimer:   This assay has been authorized by FDA under an Emergency Use Authorization for use in laboratories certified under the Clinical Laboratory Improvement Amendments of 1988 (CLIA), 42 U.S.C. 561-055-3507, to perform high complexity tests. The impacts of vaccines, antiviral therapeutics, antibiotics, chemotherapeutic or immunosuppressant drugs have not been evaluated.     Test methodology:   Cepheid Xpert Xpress SARS-CoV-2/Flu/RSV Assay real-time polymerase chain reaction (RT-PCR) test on the  GeneXpert Dx and Xpert Xpress systems.   CBC/DIFF    Narrative:     The following orders were created for panel order CBC/DIFF.  Procedure                               Abnormality         Status                     ---------                               -----------         ------  CBC WITH ZOXW[[id number]]                Abnormal            Final result                 Please view results for these tests on the individual orders.   ETHANOL, SERUM   TRICYCLIC SCREEN       Imaging:  No orders to display        Nursing notes, labs, and imaging reviewed.    Orders:  Orders Placed This Encounter   . CBC/DIFF   . COMPREHENSIVE METABOLIC PANEL, NON-FASTING   . COVID-19, FLU A/B, RSV RAPID BY PCR   . ACETAMINOPHEN LEVEL   . ETHANOL, SERUM   . SALICYLATE ACID LEVEL   . TRICYCLIC SCREEN   . CANCELED: ETHANOL, SERUM   . DRUG SCREEN, NO CONFIRMATION, URINE   . CBC WITH DIFF   . CANCELED: PATIENT CLASS/LEVEL OF CARE DESIGNATION - [facility]   . PATIENT CLASS/LEVEL OF CARE DESIGNATION - [facility]   . QUEtiapine (SEROQUEL) tablet   . enoxaparin (LOVENOX) 40 mg/0.4 mL SubQ injection   . acetaminophen (TYLENOL) tablet   . aluminum-magnesium hydroxide-simethicone (MAG-AL PLUS) 200-200-20 mg per 5 mL oral liquid   . loperamide (IMODIUM) capsule   . multivitamin-iron-folic acid (CEROVITE ADVANCED FORMULA) tablet   . ondansetron (ZOFRAN) tablet   . sennosides-docusate sodium (SENOKOT-S) 8.6-50mg  per tablet   . traZODone (DESYREL) tablet   . methocarbamol (ROBAXIN) tablet   . hydrOXYzine pamoate (VISTARIL) capsule   . dicyclomine (BENTYL) tablet   . carbidopa-levodopa (SINEMET) 25-100 mg per tablet   . mirtazapine (REMERON) tablet           Medical Decision Making   Medical Decision Making  In brief, Elizabeth Hunter is a 80 y.o. female who presented to the ED for benzodiazepine withdrawal and depression.  I ordered a psychiatric placement workup.  The urine drug screen was positive for cannabinoid.  The remainder of the labs were  unremarkable.  I spoke with the psychiatrist on-call, Dr. Aquilla Hacker, who did not feel that she was a candidate for admission to the Behavioral Health Unit.  I then spoke with the physician's assistant covering for the hospitalist, Elizabeth Hunter, Esau Grew who accepted her for admission to [facility].      Clinical Impression:     Encounter Diagnoses   Name Primary?   . Symptoms of depression Yes   . Benzodiazepine withdrawal (CMS HCC)        Disposition: Admitted    Follow up:   No follow-up provider specified.                         Parts of this patient's chart were completed in a retrospective fashion due to simultaneous direct patient care activities in the Emergency Department.

## 2020-07-18 ENCOUNTER — Telehealth (INDEPENDENT_AMBULATORY_CARE_PROVIDER_SITE_OTHER): Payer: Self-pay | Admitting: PSYCHIATRY

## 2020-07-18 ENCOUNTER — Telehealth (INDEPENDENT_AMBULATORY_CARE_PROVIDER_SITE_OTHER): Payer: Self-pay | Admitting: Physician Assistant

## 2020-07-18 ENCOUNTER — Other Ambulatory Visit (INDEPENDENT_AMBULATORY_CARE_PROVIDER_SITE_OTHER): Payer: Self-pay | Admitting: Physician Assistant

## 2020-07-18 DIAGNOSIS — R45851 Suicidal ideations: Secondary | ICD-10-CM

## 2020-07-18 DIAGNOSIS — F419 Anxiety disorder, unspecified: Secondary | ICD-10-CM

## 2020-07-18 LAB — TRICYCLIC SCREEN: TRICYCLICS SCREEN BLOOD: <40 ng/mL

## 2020-07-18 MED ORDER — LOPERAMIDE 2 MG CAPSULE
4.0000 mg | ORAL_CAPSULE | Freq: Four times a day (QID) | ORAL | Status: DC | PRN
Start: 2020-07-18 — End: 2020-07-18

## 2020-07-18 MED ORDER — ALUMINUM-MAG HYDROXIDE-SIMETHICONE 200 MG-200 MG-20 MG/5 ML ORAL SUSP
30.0000 mL | Freq: Four times a day (QID) | ORAL | Status: DC | PRN
Start: 2020-07-18 — End: 2020-07-18

## 2020-07-18 MED ORDER — ACETAMINOPHEN 325 MG TABLET
650.0000 mg | ORAL_TABLET | ORAL | Status: DC | PRN
Start: 2020-07-18 — End: 2020-07-18

## 2020-07-18 MED ORDER — CITALOPRAM 20 MG TABLET
20.0000 mg | ORAL_TABLET | Freq: Every day | ORAL | 0 refills | Status: DC
Start: 2020-07-18 — End: 2020-07-22

## 2020-07-18 MED ORDER — ONDANSETRON HCL 4 MG TABLET
4.0000 mg | ORAL_TABLET | Freq: Four times a day (QID) | ORAL | Status: DC | PRN
Start: 2020-07-18 — End: 2020-07-18

## 2020-07-18 MED ORDER — ENOXAPARIN 40 MG/0.4 ML SUB-Q SYRINGE - EAST
40.0000 mg | INJECTION | SUBCUTANEOUS | Status: DC
Start: 2020-07-18 — End: 2020-07-18
  Administered 2020-07-18: 40 mg via SUBCUTANEOUS
  Filled 2020-07-18: qty 0.4

## 2020-07-18 MED ORDER — MIRTAZAPINE 15 MG TABLET
15.0000 mg | ORAL_TABLET | Freq: Every evening | ORAL | Status: DC
Start: 2020-07-18 — End: 2020-07-18
  Administered 2020-07-18: 15 mg via ORAL

## 2020-07-18 MED ORDER — SENNOSIDES 8.6 MG-DOCUSATE SODIUM 50 MG TABLET
2.0000 | ORAL_TABLET | Freq: Every evening | ORAL | Status: DC | PRN
Start: 2020-07-18 — End: 2020-07-18

## 2020-07-18 MED ORDER — MULTIVITAMIN-FERROUS FUMARATE-FOLIC ACID 18 MG-400 MCG TABLET
1.0000 | ORAL_TABLET | Freq: Every day | ORAL | Status: DC
Start: 2020-07-18 — End: 2020-07-18
  Administered 2020-07-18: 1 via ORAL
  Filled 2020-07-18: qty 1

## 2020-07-18 MED ORDER — TRAZODONE 50 MG TABLET
50.0000 mg | ORAL_TABLET | Freq: Every evening | ORAL | Status: DC | PRN
Start: 2020-07-18 — End: 2020-07-18

## 2020-07-18 MED ORDER — CHLORDIAZEPOXIDE 25 MG CAPSULE
50.0000 mg | ORAL_CAPSULE | Freq: Three times a day (TID) | ORAL | Status: DC
Start: 2020-07-19 — End: 2020-07-18

## 2020-07-18 MED ORDER — QUETIAPINE 25 MG TABLET
25.0000 mg | ORAL_TABLET | ORAL | Status: AC
Start: 2020-07-18 — End: 2020-07-18
  Administered 2020-07-18: 25 mg via ORAL
  Filled 2020-07-18: qty 1

## 2020-07-18 MED ORDER — CHLORDIAZEPOXIDE 25 MG CAPSULE
50.0000 mg | ORAL_CAPSULE | Freq: Four times a day (QID) | ORAL | Status: DC
Start: 2020-07-18 — End: 2020-07-18
  Filled 2020-07-18: qty 2

## 2020-07-18 MED ORDER — CHLORDIAZEPOXIDE 25 MG CAPSULE
25.0000 mg | ORAL_CAPSULE | Freq: Three times a day (TID) | ORAL | Status: DC
Start: 2020-07-20 — End: 2020-07-18

## 2020-07-18 MED ORDER — CARBIDOPA 25 MG-LEVODOPA 100 MG TABLET
1.0000 | ORAL_TABLET | Freq: Three times a day (TID) | ORAL | Status: DC | PRN
Start: 2020-07-18 — End: 2020-07-18

## 2020-07-18 MED ORDER — HYDROXYZINE PAMOATE 25 MG CAPSULE
50.0000 mg | ORAL_CAPSULE | Freq: Four times a day (QID) | ORAL | Status: DC | PRN
Start: 2020-07-18 — End: 2020-07-18
  Administered 2020-07-18 (×2): 50 mg via ORAL
  Filled 2020-07-18 (×2): qty 2

## 2020-07-18 MED ORDER — METHOCARBAMOL 500 MG TABLET
750.0000 mg | ORAL_TABLET | Freq: Four times a day (QID) | ORAL | Status: DC | PRN
Start: 2020-07-18 — End: 2020-07-18

## 2020-07-18 MED ORDER — DICYCLOMINE 20 MG TABLET
20.0000 mg | ORAL_TABLET | Freq: Four times a day (QID) | ORAL | Status: DC | PRN
Start: 2020-07-18 — End: 2020-07-18

## 2020-07-18 NOTE — Telephone Encounter (Signed)
I will add her to the waitlist as high priority.

## 2020-07-18 NOTE — Discharge Summary (Signed)
[facility]  DISCHARGE SUMMARY    PATIENT NAME:  Elizabeth Hunter, Elizabeth Hunter  MRN:  [id number]  DOB:      ENCOUNTER DATE:  07/17/2020  INPATIENT ADMISSION DATE: 07/17/2020  DISCHARGE DATE:  07/18/2020    ATTENDING PHYSICIAN: Amil Amen, DO  SERVICE: [facility]  PRIMARY CARE PHYSICIAN: Elizabeth Kicks, DO         LAY CAREGIVER:  ,  ,        PRIMARY DISCHARGE DIAGNOSIS:    Active Hospital Problems    Diagnosis Date Noted   . Suicidal ideation [R45.851] 07/18/2020   . 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 Generalized anxiety disorder with panic attacks [F41.1, F41.0] 09/08/2018      Resolved Hospital Problems   No resolved problems to display.     Active Non-Hospital Problems    Diagnosis Date Noted   . Constipation 04/14/2020   . Incomplete defecation 04/14/2020   . Rectocele 04/14/2020   . Insomnia 05/24/2019   . Hematuria 05/24/2019   . Glucosuria 05/24/2019   . Fatigue 05/24/2019   . Moderate mixed hyperlipidemia not requiring statin therapy 05/24/2019   . DDD (degenerative disc disease), lumbar 01/06/2019   . Mild major depression (CMS HCC) 09/08/2018   . Hip pain 07/25/2013   . Sacroiliac pain 07/25/2013        DISCHARGE MEDICATIONS:     Current Discharge Medication List      CONTINUE these medications - NO CHANGES were made during your visit.      Details   meloxicam 15 mg Tablet  Commonly known as: MOBIC   15 mg, Oral, DAILY  Qty: 90 Tablet  Refills: 3     mirtazapine 15 mg Tablet  Commonly known as: REMERON   15 mg, Oral, NIGHTLY  Qty: 30 Tablet  Refills: 2          Discharge med list refreshed?  YES     ALLERGIES:  No Known Allergies    REASON FOR HOSPITALIZATION AND HOSPITAL COURSE     BRIEF HPI:  This is a Elizabeth y.o., Hunter who presented on 6/16 with severe anxiety.  Intention of the ED physicians was for her to be admitted to the [facility] program for benzodiazepine withdraw.  She did not wish to be given and Benzodiazepines and had not been on any for 12 days.    BRIEF  HOSPITAL NARRATIVE:       She was observed in the ED.  She was refused by the breakthrough program.  I do not appreciate any symptoms of withdraw.  She is quite anxious on exam though.  She denies any suicidal or homicidal ideation to me.  I spoke with the psychiatrist on call Dr. Aquilla Hacker and he agreed that the patient did not require inpatient admission.  She can discharge to home with close follow-up with her primary psychiatrist Elizabeth Hunter.  Continue current medications for now and she will be reassessed for any additions at that visit.    I examined the patient on day of discharge:  GENERAL: This is a Elizabeth Hunter in no acute distress, she is walking around her room, slightly anxious  EYES: Sclera anicteric  HEENT: Head atraumatic, normocephalic, oral mucosa moist  HEART: Regular rate and rhythm, no murmurs  LUNGS: CTA bilaterally, no wheezes, rhonchi, or rales  ABDOMEN: Soft, nontender, positive bowel sounds  EXTREMITY: No peripheral edema  MUSCULOSKELETAL: No deformity  NEURO: Alert and oriented x 3, CN II-XII  grossly intact, moves all extremities, sensation all extremities    TRANSITION/POST DISCHARGE CARE/PENDING TESTS/REFERRALS: Follow up with Elizabeth Hunter on Monday 6/20.  Follow up with PCP in 1 week.      CONDITION ON DISCHARGE:  A. Ambulation: Full ambulation  B. Self-care Ability: Complete  C. Cognitive Status Alert and Oriented x 3  D. Code status at discharge: Full Code        LINES/DRAINS/WOUNDS AT DISCHARGE:   Patient Lines/Drains/Airways Status     Active Line / Dialysis Catheter / Dialysis Graft / Drain / Airway / Wound     None                DISCHARGE DISPOSITION:  Home discharge      DISCHARGE INSTRUCTIONS:   Follow-up Information     Midcap, Douglas, DO In 1 week.    Specialty: FAMILY MEDICINE  Contact information:  [address]  [place] [address]  [phone]             Latina Craver, MD On 07/21/2020.    Specialty: PSYCHIATRY  Why: Anxiety  Contact  information:  [address]  [place] [address]  [phone]                          DISCHARGE INSTRUCTION - DIET     Diet: RESUME HOME DIET      DISCHARGE INSTRUCTION - ACTIVITY     Activity: AS TOLERATED           Amil Amen, DO    Copies sent to Care Team       Relationship Specialty Notifications Start End    Elizabeth Hunter, [place] PCP - General FAMILY MEDICINE  11/29/17     Phone: [phone] Fax: [phone]         [address] [place] [facility] [address]            Referring providers can utilize [email or web address] to access their referred [facility] patient's information.

## 2020-07-18 NOTE — H&P (Signed)
[facility]  H&P    Kamee Bobst 80 y.o. female [birth year]   Date of Service: 07/18/2020    Date of Admission:  07/17/2020   PCP: Cecilie Kicks, DO Code Status:Prior       Chief Complaint:  Benzodiazepine withdrawal  HPI:   Patient is a 80 year old female with past medical history of AAA, depression, fibromyalgia, osteoarthritis presenting for benzodiazepine withdrawal.  Patient reports taking Xanax for about 3 years but wanted to come off of it, she talked to her psychiatrist about this and began weaning and then was transitioned to Klonopin at a lower dose.  Since weaning, she reports increased anxiety and insomnia, she feels as though she is withdrawing from benzodiazepines and would like help.  She admits that her husband recently passed away and since that time she feels like she is spiraling out of control.  She reports that she has nothing to live for and admits to suicidal ideation.  She denies any other issues at this time including chest pain, shortness a breath, fever/chills cough/congestion, abdominal pain, N/V/D.   ED course:  CBC and CMP unremarkable.  UDS positive for cannabinoids.  COVID negative.    ED medications:          ROS: 14 point ROS (--) except for symptoms discussed above    PMHx:    Past Medical History:   Diagnosis Date   . AAA (abdominal aortic aneurysm) (CMS HCC)    . Back ache    . Bowel trouble     obstruction/nonsurgical   . Chronic headaches    . Constipation    . Depression    . Fibromyalgia    . Incomplete defecation    . Osteoarthritis    . Rectocele    . Splenic artery aneurysm (CMS HCC)         PSHx:   Past Surgical History:   Procedure Laterality Date   . HX APPENDECTOMY     . HX CESAREAN SECTION      2   . HX CHOLECYSTECTOMY     . HX COLONOSCOPY     . HX LAP CHOLECYSTECTOMY  2012   . HX SINUS SURGERY  2014   . HX WISDOM TEETH EXTRACTION            Allergies:    No Known Allergies Social History  Social History     Tobacco Use   . Smoking status: Never Smoker   .  Smokeless tobacco: Never Used   Vaping Use   . Vaping Use: Never used   Substance Use Topics   . Alcohol use: No   . Drug use: Yes     Types: Benzodiazepines       Family History  Family Medical History:     Problem Relation (Age of Onset)    Heart Attack Father (33)    Hypertension (High Blood Pressure) Mother (35)             Home Meds:      Prior to Admission medications    Medication Sig Start Date End Date Taking? Authorizing Provider   meloxicam (MOBIC) 15 mg Oral Tablet Take 1 Tablet (15 mg total) by mouth Once a day 07/10/20   Read Drivers, PA-C   Mirtazapine (REMERON) 15 mg Oral Tablet Take 1 Tablet (15 mg total) by mouth Every night Indications: major depressive disorder 07/07/20   Latina Craver, MD   clonazePAM (KLONOPIN) 0.5 mg Oral Tablet One-half tab po bid.  Indications: panic disorder 06/10/20 07/08/20  Latina Craver, MD   meloxicam (MOBIC) 15 mg Oral Tablet Take 1 Tablet (15 mg total) by mouth Once a day 02/19/20 07/10/20  Read Drivers, PA-C   Mirtazapine (REMERON) 7.5 mg Oral Tablet Take 1 Tablet (7.5 mg total) by mouth Every night 06/10/20 07/07/20  Latina Craver, MD            Diagnostic studies:  ABDOMINAL DUPLEX - AORTA  (INCLUDES ILIAC ARTERY)    Result Date: 07/08/2020  Procedure: A duplex examination of the abdominal aorta was performed using 2D with color and spectral Doppler for evaluation of aneurysm. Aorta: Patient was NPO for this study. 1. Slight ectatic dilatation of the mid abdominal aorta measuring 2.0 x 2.5 cm.  2. Slight ectatic dilatation of the the common iliac arteries bilaterally right measuring 1.0 x 1.3 cm and left measuring 1.1 x 1.3 cm.  3. Flow velocities were normal in the abdominal aorta and common iliac arteries .   Bilateral: 1. Patient was NPO for this study. 2. Maximum diameter taken at the mid segment measuring 2.0 x 2.5 cm. 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  Right-sided neck pain Pt c/o slight neck pain right side..Follow with MD, every 6 months TECHNIQUE:  CTA imaging of the neck from the aortic arch to the skull base during with intravenous contrast.  3D reconstructions.  CONTRAST: 120 ml's of Isovue 300 COMPARISON:  None. # of known CTs in the past 12 months: 1 # of known Cardiac Nuclear Medicine Studies in the past 12 months: 0 FINDINGS: Aortic Arch: Normal size and branching pattern.  No significant atherosclerotic plaque. 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 Arise from the subclavians. Both vertebrals form the basilar. Right Vertebral: Unremarkable. Left Vertebral: Unremarkable. Other findings:  No lymphadenopathy. Lung apices are clear.     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 One or more dose reduction techniques were used (e.g., Automated exposure control, adjustment of the mA and/or kV according to patient size, use of iterative reconstruction technique). Radiologist location ID: [id number]     ECG 12 LEAD    Result Date: 07/01/2020  Sinus tachycardiawith occasional premature atrial complexesBorderline ECGConfirmed by Aaron Mose ([id number]) on 07/01/2020 8:58:49 AM        PHYSICAL EXAM:  VITALS: BP 136/65   Pulse 85   Temp 36.4 C (97.6 F)   Resp 18   Ht 1.549 m (5\' 1" )   Wt 56.7 kg (125  lb)   SpO2 96%   BMI 23.62 kg/m     GENERAL:   Pt is a pleasant, well-nourished, well-developed 80 y.o. female who is resting comfortably in bed in NAD. Appears stated age  HEENT:  head normocephalic, symmetrical facies. Sclera non-icteric, non-injected. Oropharyngeal mucous membranes are moist w/o erythema/exudates   CV: RRR. No  murmur. No BLE edema.   LUNGS: CTAB, No rhonchi, crackles, or wheeze.   BREAST: deferred   RECTAL: deferred  GI: (+) BS in all 4 quadrants. soft, NT/ ND. No rigidity/ guarding/ rebound.  GU: deferred  MSK: spontaneous movement of all 4 extremities. Gait not assessed at this time.  SKIN: No significant lesions, rashes, ecchymoses noted.   NEURO: AAOx4, CN grossly intact. Sensation intact b/l. No focal deficits.  PSYCH:  Tearful.  Depressed Mood, behavior and affect normal w/intact judgement & insight     Assessments:  1. Benzodiazepine withdrawal  2. Suicidal ideation    Plan:   Admitted to [facility].  Adamantly does not want benzodiazepines at this time.  Continue supportive treatments.   Psychiatry consulted, recommendations appreciated.  Suicide precautions in place.    DVT prophylaxis:  Lovenox    Clemetine Marker, PA-C    This patient was seen and evaluated independently of the attending physician.  All aspects of the care plan were discussed in detail with him/her and he/she is in agreement with the care plan as stated above.  This note may have been partially generated using M*Modal Fluency Direct system, and there may be some incorrect words, spelling words, and punctuation that were not noted in checking the note before saving.     I personally saw and examined this patient on 07/18/2020 at 09:30.  I confirmed all key elements of the history and exam.  I agree with the assessments and plan of Clemetine Marker PA-C unless otherwise noted.  Please see the mid-level provider's note for full details.    I examined the patient today:  GENERAL: This is a 80 year old female in no  acute distress, she is walking around her room, slightly anxious  EYES: Sclera anicteric  HEENT: Head atraumatic, normocephalic, oral mucosa moist  HEART: Regular rate and rhythm, no murmurs  LUNGS: CTA bilaterally, no wheezes, rhonchi, or rales  ABDOMEN: Soft, nontender, positive bowel sounds  EXTREMITY: No peripheral edema  MUSCULOSKELETAL: No deformity  NEURO: Alert and oriented x 3, CN II-XII grossly intact, moves all extremities, sensation all extremities    The patient is anxious, but not in benzodiazepine withdrawal.  She has not had any benzodiazepines for 12 days, so would not be at risk for any severe withdrawals.  She has been refused admission by the breakthrough program.  She is not suicidal or homicidal, and I do not think she needs psychiatric admission.  I spoke with Dr. Mikle Bosworth the psychiatrist on-call who agreed.  She can be discharged home with close follow-up with her personal psychiatrist Dr. Corena Herter.     Amil Amen DO, [facility]

## 2020-07-18 NOTE — ED Nurses Note (Signed)
Patient awake, ambulatory to restroom - states that the medication has all worn off and she feels anxious, can't sleep, and is tearful, states she can't stay still any longer - Medicated with prn Vistaril. Provided with meal tray at this time, states she doesn't think she will even be able to eat d/t feeling anxious.

## 2020-07-18 NOTE — ED Nurses Note (Signed)
Patient discharged home with family.  AVS reviewed with patient/care giver.  A written copy of the AVS and discharge instructions was given to the patient/care giver.  Questions sufficiently answered as needed.  Patient/care giver encouraged to follow up with PCP as indicated.  In the event of an emergency, patient/care giver instructed to call 911 or go to the nearest emergency room.

## 2020-07-18 NOTE — Telephone Encounter (Signed)
There are no openings next week. Do you want her in your 12:30 lunch spot?

## 2020-07-18 NOTE — Telephone Encounter (Signed)
She denies suicidal ideation. We will have her start Celexa 10mg  daily

## 2020-07-18 NOTE — ED Nurses Note (Signed)
Patient with ready bed on 2N for [facility] - while calling report questions arose regarding patient's SI and need for 1:1 sitter while on inpatient unit. No staff available to sit 1:1 with patient at this time, so patient will have to board in the ED overnight.

## 2020-07-18 NOTE — ED Nurses Note (Signed)
Darl Pikes, [employer] Supervisor called and states Remigio Eisenmenger from [facility] is not accepting pt to [place].

## 2020-07-18 NOTE — ED Nurses Note (Signed)
Pt is very anxious and is pacing the hall back and forth from her room to the nurse's station. Pt states she "doesn't know what she's doing here." Pt also is asking when she is to receive medication because she is "anxious." Pt also asks when she is going to receive a bed and what they are going to do for her once she gets there. Pt states she feels like she is "climbing the walls" and has "fluttering" up and down her chest. Reassured patient we will give her her medication when it is due and that she should try to eat something even though she states "she can't eat anything."

## 2020-07-18 NOTE — Telephone Encounter (Signed)
Received a call from Emergency at [facility], Elizabeth Hunter was there and they wanted her seen right away. I asked if she'd been assessed for admission and if it had been part of that assessment that she be seen right away. She said it was only ordered as a follow up. She then said Dr. Jerene Bears wanted her to have something for anxiety. I told her if that wasn't addressed during the admission assessment, then Dr. Jerene Bears would have to handle that.  The note says she was being admitted to [facility], but Er was calling here for an appt? You do have a 3:30 open. Do you want her in there? Or do you prefer the [facility] admission?

## 2020-07-21 NOTE — Telephone Encounter (Signed)
I have patient coming in on 07/22/2020

## 2020-07-22 ENCOUNTER — Ambulatory Visit (INDEPENDENT_AMBULATORY_CARE_PROVIDER_SITE_OTHER): Payer: HMO | Admitting: PSYCHIATRY

## 2020-07-22 ENCOUNTER — Encounter (INDEPENDENT_AMBULATORY_CARE_PROVIDER_SITE_OTHER): Payer: Self-pay | Admitting: PSYCHIATRY

## 2020-07-22 ENCOUNTER — Other Ambulatory Visit: Payer: Self-pay

## 2020-07-22 VITALS — BP 148/76 | HR 102 | Resp 16 | Ht 61.0 in | Wt 125.0 lb

## 2020-07-22 DIAGNOSIS — F411 Generalized anxiety disorder: Secondary | ICD-10-CM

## 2020-07-22 DIAGNOSIS — F339 Major depressive disorder, recurrent, unspecified: Secondary | ICD-10-CM

## 2020-07-22 DIAGNOSIS — Z6823 Body mass index (BMI) 23.0-23.9, adult: Secondary | ICD-10-CM

## 2020-07-22 MED ORDER — MIRTAZAPINE 15 MG TABLET
15.0000 mg | ORAL_TABLET | Freq: Every evening | ORAL | 2 refills | Status: DC
Start: 2020-07-22 — End: 2020-10-22

## 2020-07-22 MED ORDER — CITALOPRAM 20 MG TABLET
20.0000 mg | ORAL_TABLET | Freq: Every day | ORAL | 2 refills | Status: DC
Start: 2020-07-22 — End: 2020-10-22

## 2020-07-22 NOTE — Progress Notes (Signed)
BEHAVIORAL MEDICINE, [facility]   [address]  [place] [address]       Name: Elizabeth Hunter MRN:  [id number]   Date: 07/22/2020 Age: 80 y.o.       History:        Last visit: 07/07/2020.    Interval History:    This patient presents the emergency department on 07/17/2020 complaining of severe depression, anxiety and insomnia.  She ended up being initially considered for admission to the [facility] but when the hospitalist found out she had actually been off benzodiazepines for over 10 days she was not a candidate.  They had called me from my recommendations as she did not meet inpatient criteria per the covering psychiatrist.  I told them to continue her Remeron 15 mg at HS and I added her on today as an appointment.    Family MD started her on Celexa 10mg  daily recently. Presents with her grandson from [place] today. He has been staying with her since her daughter another grandson live back to [place].  At 1 point a family friend came in to stay with her.      She vented today about her grief but mainly about "I am so scared of being alone -I have never been alone".  Grandson and I discussed assisted living.  He is also made arrangements for her to be admitted to the crisis unit at [facility] at any time and he actually visited it and talked to staff there.  I supported her today at length.  We discussed how her withdrawal /physical dependence on the Klonopin was over however psychologic dependence on benzo's remains.      I worked to redirect her to the issues at hand which are dealing with the loss of her husband and making reasonable plans for her future.  She is desperate to be around family in some manner and certainly on unconscious level she wants actually 1 of them to live with her permanently and we discussed that gently she was told directly by grandson and myself that that is not possible at this time.      I educated her about how the  antidepressants will work over time and she needs to use distraction in a positive way such as exercise, television, other healthy activities including be around her many friends who have lost their husbands and adjusted/adapted.      Substance use: Denies any substance use.    Review of systems:   Constitutional: Denies any medication side effect.  GI: Denies any diarrhea, constipation.  Dermatological:  Denies any skin rash    Medical and Surgical history in the interim: Non-contributory.  Past Medical History:   Diagnosis Date   . AAA (abdominal aortic aneurysm) (CMS HCC)    . Back ache    . Bowel trouble     obstruction/nonsurgical   . Chronic headaches    . Constipation    . Depression    . Fibromyalgia    . Incomplete defecation    . Osteoarthritis    . Rectocele    . Splenic artery aneurysm (CMS [facility])        Past Surgical History:   Procedure Laterality Date   . HX APPENDECTOMY     . HX CESAREAN SECTION      2   . HX CHOLECYSTECTOMY     . HX COLONOSCOPY     . HX LAP CHOLECYSTECTOMY  2012   .  HX SINUS SURGERY  2014   . HX WISDOM TEETH EXTRACTION         Current Outpatient Medications   Medication Sig Dispense Refill   . citalopram (CELEXA) 20 mg Oral Tablet Take 1 Tablet (20 mg total) by mouth Once a day for 90 days 30 Tablet 2   . meloxicam (MOBIC) 15 mg Oral Tablet Take 1 Tablet (15 mg total) by mouth Once a day 90 Tablet 3   . mirtazapine (REMERON) 15 mg Oral Tablet Take 1 Tablet (15 mg total) by mouth Every night Indications: major depressive disorder 30 Tablet 2     No current facility-administered medications for this visit.        Allergies: No Known Allergies    Exam:      BP (!) 148/76   Pulse (!) 102   Resp 16   Ht 1.549 m (5\' 1" )   Wt 56.7 kg (125 lb)   SpO2 98%   BMI 23.62 kg/m       General appearance: Well dressed, well groomed. Cooperative  Musculoskeletal: Normal gait and station. Normal muscle strength and tone.  Speech: Articulate, appropriate with spontaneous elaborations  Affect:  Congruent with mood which is described as "  I am still on mess sometimes". A bit histrionic at times which is stable for her.  Thought process: Logical, linear and goal directed.  Association: Intact  Abnormal thought: None  However is quite ruminative.  She does denied death wishes or suicidal ideations.  She denies auditory or visual hallucinations or ideas of reference.  Judgement and Insight: Partial  Attention and concentration: Adequate  Language: Fluent use of English.    Assessment: Diagnosis: unchanged.     Overall slightly better than last visit. .    Substance use is not a concern. No legal issues. No acute suicide risk. Discussed status and reviewed options for therapy. Discussed medications, psychotherapy and structures/schedule of program. Reviewed plan, goals, rationale, risks, benefits, and alternatives. Patient indicates understanding and agreement. Has decision making capacity.        ICD-10-CM    1. 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 Generalized anxiety disorder  F41.1         Plan:   Medications:   Agree with Celexa initiation and will increase to 20 mg daily.  She was on up to 40 mg daily in the past for "over 20 years" and it helped her GAD.  Will continue Remeron 15 mg at HS.  Therapy:  Continue current with .  RTC:   Two weeks.  Labs/test/imagining: None    I spent approximately 35 minutes on this case today with the patient present over 66% of that time and we discussed her treatment plan, issues, medications, potential medication adverse effects and I obtained informed consent.  Over 50% of her time was spent on counseling, supportive psychotherapy and patient Education.      Myrtis Hopping, MD      This note was partially created using voice recognition software and is inherently subject to errors including those of syntax and "sound-alike" substitutions which may escape proofreading.  In such instances, original meaning may be extrapolated by contextual  derivation.

## 2020-07-22 NOTE — Nursing Note (Signed)
07/22/20 1248   PHQ 9 (follow up)   Little interest or pleasure in doing things. 3   Feeling down, depressed, or hopeless 3   PHQ 2 Total 6   Trouble falling or staying asleep, or sleeping too much. 3   Feeling tired or having little energy 3   Poor appetite or overeating 3   Feeling bad about yourself/ that you are a failure in the past 2 weeks? 0   Trouble concentrating on things in the past 2 weeks? 3   Moving/Speaking slowly or being fidgety or restless  in the past 2 weeks? 3   Thoughts that you would be better off DEAD, or of hurting yourself in some way. 0   If you checked off any problems, how difficult have these problems made it for you to do your work, take care of things at home, or get along with other people? Very difficult   PHQ 9 Total 21   Interpretation of Total Score Severe depression

## 2020-07-22 NOTE — Nursing Note (Signed)
07/22/20 1249   [place] Suicide Severity Rating Scale (Since Last Contact Screener)   1. Wish to be Dead (Since Last Contact) N   2. Non-Specific Active Suicidal Thoughts (Since Last Contact) N   6. Suicidal Behavior (Since Last Contact) N   Calculated C-SSRS Risk Score (Since Last Contact) No Risk Indicated

## 2020-07-24 ENCOUNTER — Other Ambulatory Visit: Payer: HMO | Attending: NURSE PRACTITIONER | Admitting: NURSE PRACTITIONER

## 2020-07-24 DIAGNOSIS — F418 Other specified anxiety disorders: Secondary | ICD-10-CM | POA: Insufficient documentation

## 2020-07-24 LAB — CBC WITH DIFF
BASOPHIL #: 0.1 10*3/uL (ref 0.00–0.20)
BASOPHIL %: 1 % (ref 0–2)
EOSINOPHIL #: 0.1 10*3/uL (ref 0.00–0.60)
EOSINOPHIL %: 1 % (ref 0–5)
HCT: 38.7 % (ref 36.0–48.0)
HGB: 13 g/dL (ref 11.6–14.8)
LYMPHOCYTE #: 1 10*3/uL — ABNORMAL LOW (ref 1.10–3.80)
LYMPHOCYTE %: 19 % (ref 19–46)
MCH: 31.5 pg (ref 24.4–34.0)
MCHC: 33.6 g/dL (ref 30.0–37.0)
MCV: 93.6 fL — ABNORMAL HIGH (ref 79.0–88.0)
MONOCYTE #: 0.4 10*3/uL (ref 0.10–0.80)
MONOCYTE %: 7 % (ref 4–12)
MPV: 9.3 fL (ref 7.5–11.5)
NEUTROPHIL #: 4 10*3/uL (ref 1.80–7.50)
NEUTROPHIL %: 72 % — ABNORMAL HIGH (ref 41–69)
PLATELETS: 229 10*3/uL (ref 130–400)
RBC: 4.13 10*6/uL (ref 3.50–5.50)
RDW: 12.3 % (ref 11.5–14.0)
WBC: 5.5 10*3/uL (ref 4.5–11.5)

## 2020-07-24 LAB — COMPREHENSIVE METABOLIC PANEL, NON-FASTING
ALBUMIN/GLOBULIN RATIO: 1.7 (ref 1.5–2.5)
ALBUMIN: 4.5 g/dL (ref 3.5–5.0)
ALKALINE PHOSPHATASE: 51 U/L (ref 38–126)
ALT (SGPT): 27 U/L (ref <35)
ANION GAP: 9 mmol/L (ref 5–19)
AST (SGOT): 32 U/L (ref 14–36)
BILIRUBIN TOTAL: 0.6 mg/dL (ref 0.2–1.3)
BUN/CREA RATIO: 25 — ABNORMAL HIGH (ref 6–20)
BUN: 17 mg/dL (ref 7–17)
CALCIUM: 9.6 mg/dL (ref 8.4–10.2)
CHLORIDE: 107 mmol/L (ref 98–107)
CO2 TOTAL: 27 mmol/L (ref 22–30)
CREATININE: 0.68 mg/dL (ref 0.52–1.00)
ESTIMATED GFR: >60 mL/min/{1.73_m2} (ref >60)
GLUCOSE: 123 mg/dL — ABNORMAL HIGH (ref 74–106)
POTASSIUM: 3.8 mmol/L (ref 3.5–5.1)
PROTEIN TOTAL: 7.1 g/dL (ref 6.3–8.2)
SODIUM: 143 mmol/L (ref 137–145)

## 2020-07-24 LAB — THYROID STIMULATING HORMONE (SENSITIVE TSH): TSH: 2.35 u[IU]/mL (ref 0.465–4.680)

## 2020-08-05 ENCOUNTER — Telehealth (INDEPENDENT_AMBULATORY_CARE_PROVIDER_SITE_OTHER): Payer: Self-pay | Admitting: PSYCHIATRY

## 2020-08-05 ENCOUNTER — Encounter (INDEPENDENT_AMBULATORY_CARE_PROVIDER_SITE_OTHER): Payer: Self-pay | Admitting: PSYCHIATRY

## 2020-08-05 NOTE — Telephone Encounter (Signed)
Elizabeth Hunter said she will not be rescheduling with you. Her chart will be closed with you, per patient.

## 2020-08-11 ENCOUNTER — Other Ambulatory Visit: Payer: Self-pay

## 2020-08-11 ENCOUNTER — Telehealth (INDEPENDENT_AMBULATORY_CARE_PROVIDER_SITE_OTHER): Payer: HMO | Admitting: Physician Assistant

## 2020-08-11 ENCOUNTER — Encounter (INDEPENDENT_AMBULATORY_CARE_PROVIDER_SITE_OTHER): Payer: Self-pay | Admitting: Physician Assistant

## 2020-08-11 VITALS — Ht 61.0 in | Wt 122.0 lb

## 2020-08-11 DIAGNOSIS — F411 Generalized anxiety disorder: Secondary | ICD-10-CM

## 2020-08-11 DIAGNOSIS — F410 Panic disorder [episodic paroxysmal anxiety] without agoraphobia: Secondary | ICD-10-CM

## 2020-08-11 DIAGNOSIS — F321 Major depressive disorder, single episode, moderate: Secondary | ICD-10-CM

## 2020-08-11 NOTE — Progress Notes (Signed)
FAMILY MEDICINE, [facility]  [address] [place] [address]    Telephone Visit    Name:  Merlinda Wrubel MRN: [id number]   Date:  08/11/2020 Age:   80 y.o.     The patient/family initiated a request for telephone service.  Verbal consent for this service was obtained from the patient/family.    Last office visit in this department: 06/20/2020      Reason for call: depression. Pt has est care with [facility]. States she is now under the care of Dr. Germaine Pomfret.   Call notes: pt reports she is overall feeling better. States she was admitted to [facility] from 07/25/20-07/27/20. Pt states her Celexa was increased to 40mg  daily a few weeks ago. Pt states she continues to take remeron. States she was also tried on Abilify but had symptoms to the Abilify. Pt states she has up coming appointment with Dr. on 09/01/20.       ICD-10-CM    1. Generalized anxiety disorder with panic attacks  F41.1     F41.0    2. 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     Total provider time spent with the patient on the phone: 32 minutes.    11/01/20, PA-C

## 2020-10-22 ENCOUNTER — Encounter (INDEPENDENT_AMBULATORY_CARE_PROVIDER_SITE_OTHER): Payer: Self-pay | Admitting: Physician Assistant

## 2020-10-22 ENCOUNTER — Ambulatory Visit (INDEPENDENT_AMBULATORY_CARE_PROVIDER_SITE_OTHER): Payer: HMO | Admitting: Physician Assistant

## 2020-10-22 ENCOUNTER — Other Ambulatory Visit: Payer: Self-pay

## 2020-10-22 VITALS — BP 112/70 | HR 112 | Temp 98.3°F | Resp 12 | Ht 61.0 in | Wt 136.6 lb

## 2020-10-22 DIAGNOSIS — F410 Panic disorder [episodic paroxysmal anxiety] without agoraphobia: Secondary | ICD-10-CM

## 2020-10-22 DIAGNOSIS — F4321 Adjustment disorder with depressed mood: Secondary | ICD-10-CM

## 2020-10-22 DIAGNOSIS — M5136 Other intervertebral disc degeneration, lumbar region: Secondary | ICD-10-CM

## 2020-10-22 DIAGNOSIS — Z0000 Encounter for general adult medical examination without abnormal findings: Secondary | ICD-10-CM

## 2020-10-22 DIAGNOSIS — Z0001 Encounter for general adult medical examination with abnormal findings: Secondary | ICD-10-CM

## 2020-10-22 DIAGNOSIS — F321 Major depressive disorder, single episode, moderate: Secondary | ICD-10-CM

## 2020-10-22 DIAGNOSIS — F411 Generalized anxiety disorder: Secondary | ICD-10-CM

## 2020-10-22 MED ORDER — MIRTAZAPINE 45 MG TABLET
45.0000 mg | ORAL_TABLET | Freq: Every evening | ORAL | Status: DC
Start: 2020-10-22 — End: 2020-11-13

## 2020-10-22 NOTE — Progress Notes (Signed)
FAMILY MEDICINE, [facility]  [address]  [place] [address]       Name: Elizabeth Hunter  MRN:  [id number]       Date: 10/22/2020  DOB:      Age: 80 y.o.             Reason for Visit: Medicare Annual (anxiety)    History of Present Illness  Elizabeth Hunter is a 80 y.o. female who is being seen today for follow up for anxiety and depression. Pt states she continues to be under the care of Dr. Germaine Pomfret. Pt states about 2 weeks ago she did have medication change. States she is still adjusting to new medication. States Celexa was stopped and Effexor was started pt states she was started on Effexor 75mg  then increased to 150mg  daily. Pt states her Remeron was also increased from 45mg  to 60mg  at bedtime. Pt states the medication does help her sleep. States she is still waking up with anxiety/panic. Pt states she is feeling anxious today being in the office. States when she gets anxious her heart rate does increase.     Past Medical History:   Diagnosis Date   . AAA (abdominal aortic aneurysm) (CMS HCC)    . Back ache    . Bowel trouble     obstruction/nonsurgical   . Chronic headaches    . Constipation    . Depression    . Fibromyalgia    . Incomplete defecation    . Osteoarthritis    . Rectocele    . Splenic artery aneurysm (CMS [facility])          Past Surgical History:   Procedure Laterality Date   . HX APPENDECTOMY     . HX CESAREAN SECTION      2   . HX CHOLECYSTECTOMY     . HX COLONOSCOPY     . HX LAP CHOLECYSTECTOMY  2012   . HX SINUS SURGERY  2014   . HX WISDOM TEETH EXTRACTION           Current Outpatient Medications   Medication Sig   . meloxicam (MOBIC) 15 mg Oral Tablet Take 1 Tablet (15 mg total) by mouth Once a day   . mirtazapine (REMERON) 45 mg Oral Tablet Take 1 Tablet (45 mg total) by mouth Every evening Indications: major depressive disorder   . venlafaxine (EFFEXOR) 75 mg Oral Tablet Take 75 mg by mouth Once a day     No Known Allergies  Family Medical History:     Problem  Relation (Age of Onset)    Heart Attack Father (68)    Hypertension (High Blood Pressure) Mother (61)          Social History     Tobacco Use   . Smoking status: Never Smoker   . Smokeless tobacco: Never Used   Vaping Use   . Vaping Use: Never used   Substance Use Topics   . Alcohol use: No   . Drug use: Yes     Types: Benzodiazepines       Nursing Notes  There are no exam notes on file for this visit.     Review of Systems  Review of Systems   Constitutional: Negative for chills, fever, malaise/fatigue and weight loss.   HENT: Negative for congestion, ear discharge, ear pain, hearing loss, nosebleeds, sinus pain and sore throat.    Eyes: Negative for blurred vision, photophobia and discharge.  Respiratory: Negative for cough, hemoptysis, sputum production, shortness of breath and wheezing.    Cardiovascular: Negative for chest pain, palpitations and leg swelling.   Gastrointestinal: Negative for abdominal pain, blood in stool, constipation, diarrhea, heartburn, nausea and vomiting.   Genitourinary: Negative for dysuria, frequency, hematuria and urgency.   Musculoskeletal: Negative for back pain, falls, joint pain, myalgias and neck pain.   Skin: Negative for itching and rash.   Neurological: Negative for dizziness, tingling, tremors, weakness and headaches.   Endo/Heme/Allergies: Negative for environmental allergies. Does not bruise/bleed easily.   Psychiatric/Behavioral: Positive for depression. Negative for memory loss, substance abuse and suicidal ideas. The patient is nervous/anxious. The patient does not have insomnia.         Grief  Husband passed away this summer 2022        Physical Exam:  BP 112/70   Pulse (!) 112   Temp 36.8 C (98.3 F)   Resp 12   Ht 1.549 m (5\' 1" )   Wt 62 kg (136 lb 9.6 oz)   SpO2 98%   BMI 25.81 kg/m       Physical Exam  Vitals and nursing note reviewed.   Constitutional:       General: She is not in acute distress.     Appearance: She is not diaphoretic.   HENT:      Head:  Normocephalic and atraumatic.   Eyes:      Conjunctiva/sclera: Conjunctivae normal.      Pupils: Pupils are equal, round, and reactive to light.   Cardiovascular:      Rate and Rhythm: Normal rate and regular rhythm.      Heart sounds: Normal heart sounds.   Pulmonary:      Effort: Pulmonary effort is normal.      Breath sounds: Normal breath sounds.   Musculoskeletal:         General: Normal range of motion.   Skin:     General: Skin is warm and dry.   Neurological:      Mental Status: She is alert and oriented to person, place, and time.      Coordination: Coordination normal.      Gait: Gait is intact.   Psychiatric:         Mood and Affect: Mood and affect normal.         Cognition and Memory: Memory normal.         Judgment: Judgment normal.      Comments: Tearful at times          Assessment and Plan    ENCOUNTER DIAGNOSES     ICD-10-CM   1. Encounter for Medicare annual wellness exam  Z00.00   2. Generalized anxiety disorder with panic attacks  F41.1    F41.0   3. 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 DDD (degenerative disc disease), lumbar  M51.36   5. Grief  F43.21      On the day of the encounter, a total of  30 minutes was spent on this patient encounter including review of historical information, examination, documentation and post-visit activities.     Reviewed medications and treatment plans  Pt to keep up coming appointment with DR. Corder  Take medications as prescribed by psychiatrist  No medications were changed at today's office  Visit, medication list was updated.     Stay active  Balanced diet    Discussed support system  Pt has 2 good group of  girlfriends   She is going to lunch with a group of friends this afternoon     Follow up: Return in 4 weeks (on 11/19/2020), or if symptoms worsen or fail to improve.      This patient was seen independently.    Read Drivers, PA-C  10/22/2020, 11:17  FAMILY MEDICINE, [facility]  [address]  [place]  [address]    Medicare Annual Wellness Visit    Name: Elizabeth Hunter MRN:  [id number]   Date: 10/22/2020 Age: 80 y.o.     SUBJECTIVE:   Elizabeth Hunter is a 80 y.o. female for presenting for Medicare Wellness exam.   I have reviewed and reconciled the medication list with the patient today.    Comprehensive Health Assessment:  Paper document COMPREHENSIVE HEALTH ASSESSMENT reviewed, signed and scanned into medical record    I have reviewed and updated as appropriate the past medical, family and social history. 10/22/2020 as summarized below:  Past Medical History:   Diagnosis Date   . AAA (abdominal aortic aneurysm) (CMS HCC)    . Back ache    . Bowel trouble     obstruction/nonsurgical   . Chronic headaches    . Constipation    . Depression    . Fibromyalgia    . Incomplete defecation    . Osteoarthritis    . Rectocele    . Splenic artery aneurysm (CMS [facility])      Past Surgical History:   Procedure Laterality Date   . Hx appendectomy     . Hx cesarean section     . Hx cholecystectomy     . Hx colonoscopy     . Hx lap cholecystectomy  2012   . Hx sinus surgery  2014   . Hx wisdom teeth extraction       Current Outpatient Medications   Medication Sig   . meloxicam (MOBIC) 15 mg Oral Tablet Take 1 Tablet (15 mg total) by mouth Once a day   . mirtazapine (REMERON) 45 mg Oral Tablet Take 1 Tablet (45 mg total) by mouth Every evening Indications: major depressive disorder   . venlafaxine (EFFEXOR) 75 mg Oral Tablet Take 75 mg by mouth Once a day     Family Medical History:     Problem Relation (Age of Onset)    Heart Attack Father (68)    Hypertension (High Blood Pressure) Mother (70)          Social History     Socioeconomic History   . Marital status: Widowed   Occupational History   . Occupation: SELF     Comment: [employer]   Tobacco Use   . Smoking status: Never Smoker   . Smokeless tobacco: Never Used   Vaping Use   . Vaping Use: Never used   Substance and Sexual Activity   . Alcohol use: No   . Drug use: Yes     Types:  Benzodiazepines   . Sexual activity: Not Currently     Comment: did not ask   Other Topics Concern   . Drives Yes   . Uses Cane Yes   . Uses walker Yes   . Uses wheelchair Yes   . Right hand dominant Yes     Review of Systems: Psychiatric: positive for anxiety and depression     List of Current Health Care Providers   Care Team     PCP     Name Type Specialty  Phone Number    Cecilie Kicks, DO Physician [facility] [phone]          Care Team     No care team found                  Health Maintenance   Topic Date Due   . Influenza Vaccine (1) 10/02/2020   . Annual Wellness Visit  10/22/2021   . Meningococcal Vaccine  Aged Out   . Adult Tdap-Td  Discontinued   . Osteoporosis screening  Discontinued   . Shingles Vaccine  Discontinued   . Covid-19 Vaccine  Discontinued   . Pneumococcal Vaccination, Age 70+  Discontinued     Medicare Wellness Assessment   Medicare initial or wellness physical in the last year?: No  Advance Directives (optional)   Does patient have a living will or MPOA: YES   Has patient provided [employer] with a copy?: YES   Advance directive information given to the patient today?: no      Activities of Daily Living   Do you need help with dressing, bathing, or walking?: No   Do you need help with shopping, housekeeping, medications, or finances?: No   Do you have rugs in hallways, broken steps, or poor lighting?: No   Do you have grab bars in your bathroom, non-slip strips in your tub, and hand rails on your stairs?: Yes   Urinary Incontinence Screen (Women >=65 only)   Do you ever leak urine when you don't want to?: No   Cognitive Function Screen (1=Yes, 0=No)   What is you age?: Correct   What is the time to the nearest hour?: Correct   What is the year?: Correct   What is the name of this clinic?: Correct   Can the patient recognize two persons (the doctor, the nurse, home help, etc.)?: Correct   What is the date of your birth? (day and month sufficient) : Correct   In what year did  World War II end?: Correct   Who is the current president of the [place]?: Correct   Count from 20 down to 1?: Correct   What address did I give you earlier?: Correct   Total Score: 10       Hearing Screen   Have you noticed any hearing difficulties?: No   Fall Risk Screen   Do you feel unsteady when standing or walking?: No  Do you worry about falling?: No  Have you fallen in the past year?: No   Vision Screen   Right Eye = 20: 0.67   Left Eye = 20: 0.8   Depression Screen   Little interest or pleasure in doing things.: Nearly every day  Feeling down, depressed, or hopeless: Nearly every day  PHQ 2 Total: 6  Trouble falling or staying asleep, or sleeping too much.: Not at all  Feeling tired or having little energy: Not at all  Poor appetite or overeating: Several Days  Feeling bad about yourself/ that you are a failure in the past 2 weeks?: More than half the days  Trouble concentrating on things in the past 2 weeks?: More than half the days  Moving/Speaking slowly or being fidgety or restless  in the past 2 weeks?: More than half the days  Thoughts that you would be better off DEAD, or of hurting yourself in some way.: Several Days  PHQ 9 Total: 14        OBJECTIVE:   BP 112/70  Pulse (!) 112   Temp 36.8 C (98.3 F)   Resp 12   Ht 1.549 m (5\' 1" )   Wt 62 kg (136 lb 9.6 oz)   SpO2 98%   BMI 25.81 kg/m        Other appropriate exam:    Health Maintenance Due   Topic Date Due   . Influenza Vaccine (1) 10/02/2020      ASSESSMENT & PLAN:    Identified Risk Factors/ Recommended Actions     ICD-10-CM    1. Encounter for Medicare annual wellness exam  Z00.00    2. Generalized anxiety disorder with panic attacks  F41.1     F41.0    3. 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 DDD (degenerative disc disease), lumbar  M51.36    5. Grief  F43.21        Depression screening is positive. Follow up plan of care: Refer to psychiatry for addtional evaluation    Orders Placed This Encounter   . mirtazapine  (REMERON) 45 mg Oral Tablet          The patient has been educated about risk factors and recommended preventive care. Written Prevention Plan completed/ updated and given to patient (see After Visit Summary).    Return in 4 weeks (on 11/19/2020), or if symptoms worsen or fail to improve.    11/21/2020, PA-C

## 2020-11-11 ENCOUNTER — Encounter (INDEPENDENT_AMBULATORY_CARE_PROVIDER_SITE_OTHER): Payer: HMO | Admitting: Physician Assistant

## 2020-11-12 ENCOUNTER — Encounter (INDEPENDENT_AMBULATORY_CARE_PROVIDER_SITE_OTHER): Payer: HMO | Admitting: Physician Assistant

## 2020-11-13 ENCOUNTER — Encounter (INDEPENDENT_AMBULATORY_CARE_PROVIDER_SITE_OTHER): Payer: Self-pay | Admitting: Physician Assistant

## 2020-11-13 ENCOUNTER — Ambulatory Visit (INDEPENDENT_AMBULATORY_CARE_PROVIDER_SITE_OTHER): Payer: HMO | Admitting: Physician Assistant

## 2020-11-13 ENCOUNTER — Other Ambulatory Visit: Payer: Self-pay

## 2020-11-13 VITALS — BP 130/68 | HR 109 | Temp 97.6°F | Ht 61.0 in | Wt 137.0 lb

## 2020-11-13 DIAGNOSIS — F410 Panic disorder [episodic paroxysmal anxiety] without agoraphobia: Secondary | ICD-10-CM

## 2020-11-13 DIAGNOSIS — Z23 Encounter for immunization: Secondary | ICD-10-CM

## 2020-11-13 DIAGNOSIS — R002 Palpitations: Secondary | ICD-10-CM

## 2020-11-13 DIAGNOSIS — F411 Generalized anxiety disorder: Secondary | ICD-10-CM

## 2020-11-13 DIAGNOSIS — R000 Tachycardia, unspecified: Secondary | ICD-10-CM

## 2020-11-13 MED ORDER — CLINDAMYCIN 1 % TOPICAL GEL
Freq: Two times a day (BID) | CUTANEOUS | 0 refills | Status: DC
Start: 2020-11-13 — End: 2021-05-21

## 2020-11-13 MED ORDER — ASCORBIC ACID (VITAMIN C) ER 500 MG CAPSULE,EXTENDED RELEASE
500.0000 mg | ORAL_CAPSULE | Freq: Every day | ORAL | Status: DC
Start: 2020-11-13 — End: 2021-05-21

## 2020-11-13 MED ORDER — CITALOPRAM 20 MG TABLET
20.0000 mg | ORAL_TABLET | Freq: Every day | ORAL | 2 refills | Status: DC
Start: 2020-11-13 — End: 2021-05-21

## 2020-11-13 NOTE — Progress Notes (Signed)
FAMILY MEDICINE, [facility]  [address]  [place] [address]       Name: Elizabeth Hunter  MRN:  [id number]       Date: 11/13/2020  DOB:      Age: 80 y.o.             Reason for Visit: Follow Up    History of Present Illness  Elizabeth Hunter is a 80 y.o. female who is being seen today for follow up. States she has had recent appointment with Dr. Germaine Pomfret. States her most recent appointment was on 11/11/20. Pt states her medications have been changed again. States she is back to taking the Celexa 20mg  nightly. States the Effexor and the Remeron have been stopped at this time. Pt states the first night sleep slept very well. And states she doesn't feel she woke up as anxious. Pt states last night she didn't have as good of a night sleep. Pt denies SI. Pt denies HI. States she just can't control her worrying and she misses her husband.     Pt also states she feels her heart rate is racing at times. States she def notices during times of anxiety attack. Pt states her heart rate is now normally 100. States she has notices an increase in her blood pressure. States since her husband has passed away she has not been cooking for herself and eating more junk food. States she has been eating more potatoe chips. Pt denies chest pain. Pt denies sob. States she does feel anxious majority of her day.     Past Medical History:   Diagnosis Date    AAA (abdominal aortic aneurysm)     Back ache     Bowel trouble     obstruction/nonsurgical    Chronic headaches     Constipation     Depression     Fibromyalgia     Incomplete defecation     Osteoarthritis     Rectocele     Splenic artery aneurysm (CMS HCC)          Past Surgical History:   Procedure Laterality Date    HX APPENDECTOMY      HX CESAREAN SECTION      2    HX CHOLECYSTECTOMY      HX COLONOSCOPY      HX LAP CHOLECYSTECTOMY  2012    HX SINUS SURGERY  2014    HX WISDOM TEETH EXTRACTION           Current Outpatient Medications    Medication Sig    Ascorbic Acid (VITAMIN C) 500 mg Oral Capsule, Sustained Release Take 1 Capsule (500 mg total) by mouth Once a day    clindamycin phosphate (CLEOCIN T) 1 % Gel Apply topically Twice daily use thin film on affected area    meloxicam (MOBIC) 15 mg Oral Tablet Take 1 Tablet (15 mg total) by mouth Once a day     No Known Allergies  Family Medical History:     Problem Relation (Age of Onset)    Heart Attack Father (53)    Hypertension (High Blood Pressure) Mother (22)          Social History     Tobacco Use    Smoking status: Never    Smokeless tobacco: Never   Vaping Use    Vaping Use: Never used   Substance Use Topics    Alcohol use: No    Drug use: Yes  Types: Benzodiazepines       Nursing Notes  There are no exam notes on file for this visit.     Review of Systems  Review of Systems   Constitutional: Negative for chills, fever, malaise/fatigue and weight loss.   HENT: Negative for congestion, ear discharge, ear pain, hearing loss, nosebleeds, sinus pain and sore throat.    Eyes: Negative for blurred vision, photophobia and discharge.   Respiratory: Negative for cough, hemoptysis, sputum production, shortness of breath and wheezing.    Cardiovascular: Positive for palpitations. Negative for chest pain and leg swelling.   Gastrointestinal: Negative for abdominal pain, blood in stool, constipation, diarrhea, heartburn, nausea and vomiting.   Genitourinary: Negative for dysuria, frequency, hematuria and urgency.   Musculoskeletal: Positive for back pain and joint pain. Negative for falls, myalgias and neck pain.   Skin: Negative for itching and rash.        Acne on the right side of her face   Neurological: Negative for dizziness, tingling, tremors, weakness and headaches.   Endo/Heme/Allergies: Negative for environmental allergies. Does not bruise/bleed easily.   Psychiatric/Behavioral: Positive for depression. Negative for memory loss, substance abuse and suicidal ideas. The patient is  nervous/anxious. The patient does not have insomnia.         Grief        Physical Exam:  BP 130/68   Pulse (!) 109   Temp 36.4 C (97.6 F)   Ht 1.549 m (5\' 1" )   Wt 62.1 kg (137 lb)   SpO2 94%   BMI 25.89 kg/m       Physical Exam  Vitals and nursing note reviewed.   Constitutional:       General: She is not in acute distress.Vital signs are normal.      Appearance: She is not diaphoretic.   HENT:      Head: Normocephalic and atraumatic.      Mouth/Throat:      Mouth: Mucous membranes are dry.   Eyes:      Extraocular Movements: EOM normal.      Conjunctiva/sclera: Conjunctivae normal.      Pupils: Pupils are equal, round, and reactive to light.   Cardiovascular:      Rate and Rhythm: Regular rhythm. Tachycardia present.      Heart sounds: Normal heart sounds.   Pulmonary:      Effort: Pulmonary effort is normal.      Breath sounds: Normal breath sounds.   Musculoskeletal:         General: Normal range of motion.   Skin:     General: Skin is warm and dry.   Neurological:      Mental Status: She is alert and oriented to person, place, and time.      Coordination: Coordination normal.      Gait: Gait is intact.   Psychiatric:         Mood and Affect: Mood and affect normal.         Behavior: Behavior normal.         Cognition and Memory: Memory normal.         Judgment: Judgment normal.      Comments: Tearful at times           Assessment and Plan    ENCOUNTER DIAGNOSES     ICD-10-CM   1. Generalized anxiety disorder with panic attacks  F41.1    F41.0   2. Palpitations  R00.2   3. Tachycardia  R00.0   4. Need for vaccination  Z23      On the day of the encounter, a total of  30 minutes was spent on this patient encounter including review of historical information, examination, documentation and post-visit activities.     Continue with current medications  Again no medication changes were made at today's office visit  Keep f/u appointment with Dr. Germaine Pomfret    We discussed the patient symptoms of palpitations   Pt  is agreeable to EVENT Monitor, we will order today  This has been discussed and order for the patient in the past which she has cancelled in the past.      Monitor blood pressure at home  Try to follow low sodium diet  Avoid over eating the potatoes chips     Use the clindamycin gel as directed on her face   Follow up: Return in about 4 weeks (around 12/11/2020), or if symptoms worsen or fail to improve.    Orders Placed This Encounter    Flu Vaccine, 65+,0.5 mL IM (Admin)    30 DAY EVENT MONITOR    Ascorbic Acid (VITAMIN C) 500 mg Oral Capsule, Sustained Release    clindamycin phosphate (CLEOCIN T) 1 % Gel     This patient was seen independently.    Steva Ready, PA-C  11/13/2020, 13:35

## 2020-11-18 ENCOUNTER — Telehealth (INDEPENDENT_AMBULATORY_CARE_PROVIDER_SITE_OTHER): Payer: Self-pay | Admitting: Physician Assistant

## 2020-11-18 ENCOUNTER — Ambulatory Visit
Admission: RE | Admit: 2020-11-18 | Discharge: 2020-11-18 | Disposition: A | Discharge status: Home / Self Care | Payer: HMO | Source: Ambulatory Visit | Attending: Physician Assistant | Admitting: Physician Assistant

## 2020-11-18 ENCOUNTER — Other Ambulatory Visit: Payer: Self-pay

## 2020-11-18 DIAGNOSIS — R000 Tachycardia, unspecified: Secondary | ICD-10-CM

## 2020-11-18 DIAGNOSIS — R002 Palpitations: Secondary | ICD-10-CM | POA: Insufficient documentation

## 2020-11-18 DIAGNOSIS — I491 Atrial premature depolarization: Secondary | ICD-10-CM

## 2020-11-18 DIAGNOSIS — I493 Ventricular premature depolarization: Secondary | ICD-10-CM

## 2020-11-18 NOTE — Telephone Encounter (Signed)
MCOT

## 2020-11-18 NOTE — Telephone Encounter (Signed)
I will if she calls me back. 11/18/20 tjm

## 2020-11-18 NOTE — Telephone Encounter (Signed)
Thank you! Let me know if i need to do anything different

## 2020-11-18 NOTE — Telephone Encounter (Signed)
Called and left a message with Steward Drone at [facility] special testing. 11/18/20 tjm

## 2020-11-20 ENCOUNTER — Encounter (INDEPENDENT_AMBULATORY_CARE_PROVIDER_SITE_OTHER): Payer: Self-pay | Admitting: Physician Assistant

## 2020-12-04 ENCOUNTER — Telehealth (INDEPENDENT_AMBULATORY_CARE_PROVIDER_SITE_OTHER): Payer: Self-pay | Admitting: Family Medicine

## 2020-12-04 NOTE — Telephone Encounter (Signed)
Can you ask her to try to stick it out the rest of this week?

## 2020-12-09 NOTE — Telephone Encounter (Signed)
She needs the Pads for the leads she's running out. 12/09/20 NJW

## 2020-12-09 NOTE — Telephone Encounter (Signed)
Patient is stopping at the end of the day for assistance. 12/09/20 NJW

## 2020-12-09 NOTE — Telephone Encounter (Signed)
Can you help me find out where she needs to get those from?

## 2020-12-11 ENCOUNTER — Telehealth (INDEPENDENT_AMBULATORY_CARE_PROVIDER_SITE_OTHER): Payer: Self-pay | Admitting: Physician Assistant

## 2020-12-11 NOTE — Telephone Encounter (Signed)
Yes just went ever there is an opening next week. If she is having an urgent issues please have her been seen at the [facility].

## 2020-12-11 NOTE — Telephone Encounter (Signed)
Tried calling the home number listed for the patient, no voicemail. I will try again to talk to her before I leave. 12/11/20 tjm

## 2020-12-17 ENCOUNTER — Encounter (INDEPENDENT_AMBULATORY_CARE_PROVIDER_SITE_OTHER): Payer: Self-pay | Admitting: Physician Assistant

## 2020-12-22 NOTE — Result Encounter Note (Signed)
Lt message for patient. 12/22/20 tjm

## 2020-12-22 NOTE — Result Encounter Note (Signed)
Please let the patient know that her event monitor showed benign findings.

## 2020-12-24 ENCOUNTER — Other Ambulatory Visit: Payer: Self-pay

## 2020-12-24 ENCOUNTER — Ambulatory Visit (INDEPENDENT_AMBULATORY_CARE_PROVIDER_SITE_OTHER): Payer: HMO | Admitting: Physician Assistant

## 2020-12-24 ENCOUNTER — Encounter (INDEPENDENT_AMBULATORY_CARE_PROVIDER_SITE_OTHER): Payer: Self-pay | Admitting: Physician Assistant

## 2020-12-24 ENCOUNTER — Encounter (INDEPENDENT_AMBULATORY_CARE_PROVIDER_SITE_OTHER): Payer: HMO | Admitting: Physician Assistant

## 2020-12-24 VITALS — BP 116/62 | HR 92 | Temp 98.0°F | Resp 12 | Ht 61.0 in | Wt 150.2 lb

## 2020-12-24 DIAGNOSIS — F411 Generalized anxiety disorder: Secondary | ICD-10-CM

## 2020-12-24 DIAGNOSIS — R002 Palpitations: Secondary | ICD-10-CM

## 2020-12-24 DIAGNOSIS — R000 Tachycardia, unspecified: Secondary | ICD-10-CM

## 2020-12-24 DIAGNOSIS — F410 Panic disorder [episodic paroxysmal anxiety] without agoraphobia: Secondary | ICD-10-CM

## 2020-12-24 DIAGNOSIS — I491 Atrial premature depolarization: Secondary | ICD-10-CM

## 2020-12-24 DIAGNOSIS — I493 Ventricular premature depolarization: Secondary | ICD-10-CM

## 2020-12-24 DIAGNOSIS — E782 Mixed hyperlipidemia: Secondary | ICD-10-CM

## 2020-12-24 MED ORDER — METOPROLOL TARTRATE 25 MG TABLET
12.5000 mg | ORAL_TABLET | Freq: Two times a day (BID) | ORAL | 2 refills | Status: DC
Start: 2020-12-24 — End: 2021-03-26

## 2020-12-24 NOTE — Progress Notes (Signed)
FAMILY MEDICINE, [facility]  [address]  [place] [address]       Name: Elizabeth Hunter  MRN:  [id number]       Date: 12/24/2020  DOB:      Age: 80 y.o.             Reason for Visit: Follow Up (anxiety)    History of Present Illness  Elizabeth Hunter is a 80 y.o. female who is being seen today for follow up after having event monitor completed. Pt states she still can feel her heart beating fast at times, esp in the evenings when she is laying down trying to read and relax.     Pt states her anxiety has been better controlled since starting Zyprexa 5mg  nightly. She reports she has had weight gain with the medication. States she will follow up with Dr. next week.   Past Medical History:   Diagnosis Date   . AAA (abdominal aortic aneurysm)    . Back ache    . Bowel trouble     obstruction/nonsurgical   . Chronic headaches    . Constipation    . Depression    . Fibromyalgia    . Incomplete defecation    . Osteoarthritis    . Rectocele    . Splenic artery aneurysm (CMS [facility])          Past Surgical History:   Procedure Laterality Date   . HX APPENDECTOMY     . HX CESAREAN SECTION      2   . HX CHOLECYSTECTOMY     . HX COLONOSCOPY     . HX LAP CHOLECYSTECTOMY  2012   . HX SINUS SURGERY  2014   . HX WISDOM TEETH EXTRACTION           Current Outpatient Medications   Medication Sig   . Ascorbic Acid (VITAMIN C) 500 mg Oral Capsule, Sustained Release Take 1 Capsule (500 mg total) by mouth Once a day   . citalopram (CELEXA) 20 mg Oral Tablet Take 1 Tablet (20 mg total) by mouth Once a day for 90 days   . clindamycin phosphate (CLEOCIN T) 1 % Gel Apply topically Twice daily use thin film on affected area   . meloxicam (MOBIC) 15 mg Oral Tablet Take 1 Tablet (15 mg total) by mouth Once a day   . metoprolol tartrate (LOPRESSOR) 25 mg Oral Tablet Take 0.5 Tablets (12.5 mg total) by mouth Twice daily   . OLANZapine (ZYPREXA) 5 mg Oral Tablet Take 2.5 mg by mouth Every night Increase to 5mg   if tolerated well.     No Known Allergies  Family Medical History:     Problem Relation (Age of Onset)    Heart Attack Father (68)    Hypertension (High Blood Pressure) Mother (99)          Social History     Tobacco Use   . Smoking status: Never   . Smokeless tobacco: Never   Vaping Use   . Vaping Use: Never used   Substance Use Topics   . Alcohol use: No   . Drug use: Yes     Types: Benzodiazepines       Nursing Notes  There are no exam notes on file for this visit.     Review of Systems  Review of Systems   Constitutional: Negative for chills, fever, malaise/fatigue and weight loss.   HENT: Negative for congestion,  ear discharge, ear pain, hearing loss, nosebleeds, sinus pain and sore throat.    Eyes: Negative for blurred vision, photophobia and discharge.   Respiratory: Negative for cough, hemoptysis, sputum production, shortness of breath and wheezing.    Cardiovascular: Positive for palpitations. Negative for chest pain and leg swelling.   Gastrointestinal: Negative for abdominal pain, blood in stool, constipation, diarrhea, heartburn, nausea and vomiting.   Genitourinary: Negative for dysuria, frequency, hematuria and urgency.   Musculoskeletal: Positive for back pain. Negative for falls, joint pain, myalgias and neck pain.   Skin: Negative for itching and rash.   Neurological: Negative for dizziness, tingling, tremors, weakness and headaches.   Endo/Heme/Allergies: Negative for environmental allergies. Does not bruise/bleed easily.   Psychiatric/Behavioral: Positive for depression. Negative for memory loss, substance abuse and suicidal ideas. The patient is nervous/anxious. The patient does not have insomnia.        Physical Exam:  BP 116/62 (Site: Left, Patient Position: Sitting, Cuff Size: Adult)   Pulse 92   Temp 36.7 C (98 F)   Resp 12   Ht 1.549 m (5\' 1" )   Wt 68.1 kg (150 lb 3.2 oz)   SpO2 97%   BMI 28.38 kg/m       Physical Exam  Vitals and nursing note reviewed.   Constitutional:        General: She is not in acute distress.Vital signs are normal.      Appearance: She is not diaphoretic.      Comments: Weight gain    HENT:      Head: Normocephalic and atraumatic.   Eyes:      Extraocular Movements: EOM normal.      Conjunctiva/sclera: Conjunctivae normal.      Pupils: Pupils are equal, round, and reactive to light.   Cardiovascular:      Rate and Rhythm: Normal rate and regular rhythm.      Heart sounds: Normal heart sounds.   Pulmonary:      Effort: Pulmonary effort is normal.      Breath sounds: Normal breath sounds.   Abdominal:      General: Bowel sounds are normal.      Palpations: Abdomen is soft.   Musculoskeletal:         General: Normal range of motion.   Skin:     General: Skin is warm and dry.   Neurological:      Mental Status: She is alert and oriented to person, place, and time.      Coordination: Coordination normal.      Gait: Gait is intact.   Psychiatric:         Mood and Affect: Mood and affect normal.         Cognition and Memory: Memory normal.         Judgment: Judgment normal.         Assessment and Plan    ENCOUNTER DIAGNOSES     ICD-10-CM   1. Moderate mixed hyperlipidemia not requiring statin therapy  E78.2   2. Generalized anxiety disorder with panic attacks  F41.1    F41.0   3. Tachycardia  R00.0   4. Palpitations  R00.2   5. PVC (premature ventricular contraction)  I49.3   6. PAC (premature atrial contraction)  I49.1      On the day of the encounter, a total of  30 minutes was spent on this patient encounter including review of historical information, examination, documentation and post-visit activities.  Continue with current medications  We will try adding low does metoprolol 12.5mg  twice daily to help with symptoms of PAC and PVC.  Pt to f/u in about 6-8 weeks, sooner if needed    Orders Placed This Encounter   . CBC/DIFF   . Comp Metabolic Panel-Fasting   . Lipid Panel   . TSH w/ Free T4 Reflex   . Hemoglobin A1C   . URINALYSIS, MACROSCOPIC AND MICROSCOPIC  W/CULTURE REFLEX   . metoprolol tartrate (LOPRESSOR) 25 mg Oral Tablet     Follow up: Return in about 2 months (around 02/23/2021), or if symptoms worsen or fail to improve.      This patient was seen independently.    Burgess Estelle, PA-C  12/24/2020, 13:34

## 2021-02-19 ENCOUNTER — Encounter (INDEPENDENT_AMBULATORY_CARE_PROVIDER_SITE_OTHER): Payer: Self-pay | Admitting: Physician Assistant

## 2021-02-19 ENCOUNTER — Other Ambulatory Visit: Payer: HMO | Attending: Physician Assistant | Admitting: Physician Assistant

## 2021-02-19 ENCOUNTER — Ambulatory Visit (INDEPENDENT_AMBULATORY_CARE_PROVIDER_SITE_OTHER): Payer: HMO | Admitting: Physician Assistant

## 2021-02-19 ENCOUNTER — Other Ambulatory Visit: Payer: Self-pay

## 2021-02-19 ENCOUNTER — Ambulatory Visit (INDEPENDENT_AMBULATORY_CARE_PROVIDER_SITE_OTHER): Payer: HMO

## 2021-02-19 VITALS — BP 136/70 | HR 75 | Temp 98.5°F | Resp 12 | Ht 61.0 in | Wt 154.0 lb

## 2021-02-19 DIAGNOSIS — F410 Panic disorder [episodic paroxysmal anxiety] without agoraphobia: Secondary | ICD-10-CM

## 2021-02-19 DIAGNOSIS — E782 Mixed hyperlipidemia: Secondary | ICD-10-CM | POA: Insufficient documentation

## 2021-02-19 DIAGNOSIS — R000 Tachycardia, unspecified: Secondary | ICD-10-CM

## 2021-02-19 DIAGNOSIS — F411 Generalized anxiety disorder: Secondary | ICD-10-CM | POA: Insufficient documentation

## 2021-02-19 DIAGNOSIS — F320 Major depressive disorder, single episode, mild: Secondary | ICD-10-CM

## 2021-02-19 LAB — LIPID PANEL
CHOL/HDL RATIO: 5
CHOLESTEROL: 214 mg/dL — ABNORMAL HIGH (ref 100–200)
HDL CHOL: 43 mg/dL — ABNORMAL LOW (ref >=50)
LDL CALC: 159 mg/dL — ABNORMAL HIGH (ref <100)
NON-HDL: 171 mg/dL (ref <=190)
TRIGLYCERIDES: 58 mg/dL (ref <150)
VLDL CALC: 12 mg/dL (ref <30)

## 2021-02-19 LAB — CBC WITH DIFF
BASOPHIL #: 0.1 10*3/uL (ref 0.00–0.30)
BASOPHIL %: 1 % (ref 0–1)
EOSINOPHIL #: 0.1 10*3/uL (ref 0.00–0.50)
EOSINOPHIL %: 2 % (ref 0–4)
HCT: 38.5 % (ref 37.0–47.0)
HGB: 12.9 g/dL (ref 12.5–16.0)
LYMPHOCYTE #: 1.3 10*3/uL (ref 0.90–4.80)
LYMPHOCYTE %: 23 % (ref 23–35)
MCH: 32.4 pg (ref 28.0–33.0)
MCHC: 33.6 g/dL (ref 32.0–37.0)
MCV: 96.3 fL (ref 78.0–100.0)
MONOCYTE #: 0.4 10*3/uL (ref 0.30–0.90)
MONOCYTE %: 8 % (ref 0–12)
NEUTROPHIL #: 3.8 10*3/uL (ref 1.70–7.00)
NEUTROPHIL %: 66 % (ref 50–70)
PLATELETS: 215 10*3/uL (ref 130–400)
RBC: 4 10*6/uL — ABNORMAL LOW (ref 4.20–5.40)
RDW: 12.7 % (ref 9.9–16.5)
WBC: 5.8 10*3/uL (ref 4.5–11.0)

## 2021-02-19 LAB — URINALYSIS, MICROSCOPIC

## 2021-02-19 LAB — URINALYSIS, MACROSCOPIC
BILIRUBIN: NEGATIVE mg/dL
BLOOD: NEGATIVE mg/dL
GLUCOSE: NEGATIVE mg/dL
KETONES: NEGATIVE mg/dL
LEUKOCYTES: NEGATIVE WBCs/uL
NITRITE: NEGATIVE
PH: 6.5 (ref 5.0–8.0)
PROTEIN: NEGATIVE mg/dL
SPECIFIC GRAVITY: 1.021 (ref 1.002–1.030)
UROBILINOGEN: <2 mg/dL (ref <2.0)

## 2021-02-19 LAB — COMPREHENSIVE METABOLIC PNL, FASTING
ALBUMIN: 3.8 g/dL (ref 3.4–4.8)
ALKALINE PHOSPHATASE: 56 U/L (ref 55–145)
ALT (SGPT): 17 U/L (ref 8–22)
ANION GAP: 8 mmol/L (ref 4–13)
AST (SGOT): 20 U/L (ref 8–45)
BILIRUBIN TOTAL: 0.5 mg/dL (ref 0.3–1.3)
BUN/CREA RATIO: 25 — ABNORMAL HIGH (ref 6–22)
BUN: 26 mg/dL — ABNORMAL HIGH (ref 8–25)
CALCIUM: 9.5 mg/dL (ref 8.8–10.2)
CHLORIDE: 109 mmol/L (ref 96–111)
CO2 TOTAL: 24 mmol/L (ref 23–31)
CREATININE: 1.05 mg/dL (ref 0.60–1.05)
ESTIMATED GFR: 54 mL/min/BSA — ABNORMAL LOW (ref >=60)
GLUCOSE: 87 mg/dL (ref 70–99)
POTASSIUM: 4.5 mmol/L (ref 3.5–5.1)
PROTEIN TOTAL: 6.9 g/dL (ref 6.0–8.0)
SODIUM: 141 mmol/L (ref 136–145)

## 2021-02-19 LAB — THYROID STIMULATING HORMONE WITH FREE T4 REFLEX: TSH: 2.151 u[IU]/mL (ref 0.430–3.550)

## 2021-02-19 NOTE — Progress Notes (Signed)
FAMILY MEDICINE, [facility]  [place] [address]       Name: Elizabeth Hunter  MRN:  [id number]       Date: 02/19/2021  DOB:      Age: 81 y.o.             Reason for Visit: Follow Up (anxiety)    History of Present Illness  Elizabeth Hunter is a 81 y.o. female who is being seen today for anxiety. Pt states overall she feels she is doing pretty well at this time. States her anxiety is much better controlled. States she is having weight gain as a side effect to the medication. States she just feels her appetite is def increased. States she is going to the gym and walking if not daily then at least 4 times weekly. Pt states she has good things she is looking forward to in the future. States her and her daughter are planning a trip to her home in [place]. Pt states she is excited for this trip and states her daughter is too.      Past Medical History:   Diagnosis Date   . AAA (abdominal aortic aneurysm)    . Back ache    . Bowel trouble     obstruction/nonsurgical   . Chronic headaches    . Constipation    . Depression    . Fibromyalgia    . Incomplete defecation    . Osteoarthritis    . Rectocele    . Splenic artery aneurysm (CMS [facility])          Past Surgical History:   Procedure Laterality Date   . HX APPENDECTOMY     . HX CESAREAN SECTION      2   . HX CHOLECYSTECTOMY     . HX COLONOSCOPY     . HX LAP CHOLECYSTECTOMY  2012   . [place] SINUS SURGERY  2014   . HX WISDOM TEETH EXTRACTION           Current Outpatient Medications   Medication Sig   . Ascorbic Acid (VITAMIN C) 500 mg Oral Capsule, Sustained Release Take 1 Capsule (500 mg total) by mouth Once a day   . citalopram (CELEXA) 20 mg Oral Tablet Take 1 Tablet (20 mg total) by mouth Once a day for 90 days   . clindamycin phosphate (CLEOCIN T) 1 % Gel Apply topically Twice daily use thin film on affected area   . meloxicam (MOBIC) 15 mg Oral Tablet Take 1 Tablet (15 mg total) by mouth Once a day   . metoprolol tartrate  (LOPRESSOR) 25 mg Oral Tablet Take 0.5 Tablets (12.5 mg total) by mouth Twice daily   . OLANZapine (ZYPREXA) 5 mg Oral Tablet Take 2.5 mg by mouth Every night Increase to 5mg  if tolerated well.     No Known Allergies  Family Medical History:     Problem Relation (Age of Onset)    Heart Attack Father (68)    Hypertension (High Blood Pressure) Mother (44)          Social History     Tobacco Use   . Smoking status: Never   . Smokeless tobacco: Never   Vaping Use   . Vaping Use: Never used   Substance Use Topics   . Alcohol use: No   . Drug use: Yes     Types: Benzodiazepines       Nursing Notes  There are  no exam notes on file for this visit.     Review of Systems  Review of Systems   Constitutional: Negative for chills, fever, malaise/fatigue and weight loss.        Weight gain    HENT: Negative for congestion, ear discharge, ear pain, hearing loss, nosebleeds, sinus pain and sore throat.    Eyes: Negative for blurred vision, photophobia and discharge.   Respiratory: Negative for cough, hemoptysis, sputum production, shortness of breath and wheezing.    Cardiovascular: Positive for palpitations. Negative for chest pain and leg swelling.        Pt reports palpitations have been improved since starting the metoprolol    Gastrointestinal: Negative for abdominal pain, blood in stool, constipation, diarrhea, heartburn, nausea and vomiting.   Genitourinary: Negative for dysuria, frequency, hematuria and urgency.   Musculoskeletal: Negative for back pain, falls, joint pain, myalgias and neck pain.   Skin: Negative for itching and rash.   Neurological: Negative for dizziness, tingling, tremors, weakness and headaches.   Endo/Heme/Allergies: Negative for environmental allergies. Does not bruise/bleed easily.   Psychiatric/Behavioral: Positive for depression. Negative for memory loss, substance abuse and suicidal ideas. The patient is nervous/anxious. The patient does not have insomnia.         Grief        Physical Exam:  BP  136/70 (Site: Left, Patient Position: Sitting, Cuff Size: Adult)   Pulse 75   Temp 36.9 C (98.5 F)   Resp 12   Ht 1.549 m (5\' 1" )   Wt 69.9 kg (154 lb)   SpO2 94%   BMI 29.10 kg/m       Physical Exam  Vitals and nursing note reviewed.   Constitutional:       General: She is not in acute distress.     Appearance: She is not diaphoretic.   HENT:      Head: Normocephalic and atraumatic.   Eyes:      Conjunctiva/sclera: Conjunctivae normal.      Pupils: Pupils are equal, round, and reactive to light.   Cardiovascular:      Rate and Rhythm: Normal rate and regular rhythm.      Heart sounds: Normal heart sounds.   Pulmonary:      Effort: Pulmonary effort is normal.      Breath sounds: Normal breath sounds.   Skin:     General: Skin is warm and dry.   Neurological:      Mental Status: She is alert and oriented to person, place, and time.      Coordination: Coordination normal.      Gait: Gait is intact.   Psychiatric:         Mood and Affect: Mood and affect normal.         Behavior: Behavior normal.         Thought Content: Thought content normal.         Cognition and Memory: Memory normal.         Judgment: Judgment normal.         Assessment and Plan    ENCOUNTER DIAGNOSES     ICD-10-CM   1. Generalized anxiety disorder with panic attacks  F41.1    F41.0   2. 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 The time  documented excludes procedural time.    Continue with current medications  Stay active  Continue in the gym  Tighten up diet, pt enjoys junk food     No changes were made at today's office visit.     Follow up: Return in about 3 months (around 05/20/2021), or if symptoms worsen or fail to improve.      This patient was seen independently.    Burgess Estelle, PA-C  02/19/2021, 13:09

## 2021-02-20 LAB — HGA1C (HEMOGLOBIN A1C WITH EST AVG GLUCOSE)
ESTIMATED AVERAGE GLUCOSE: 97 mg/dL (ref 0–153)
HEMOGLOBIN A1C: 5 % (ref <=7.0)

## 2021-02-20 NOTE — Result Encounter Note (Signed)
Left message for patient. 02/20/21 tjm

## 2021-02-20 NOTE — Result Encounter Note (Signed)
Please let the patient know that lab work has been reviewed. Her TSH is WNL, hgAlc is WNL, CBC is WNL, urine is WNL.     Her cholesterol has returned more elevated and now her LDL is high at 159.   We may need to start cholesterol lowering medication in the near future if we can't get the LDL lowered. Really need her to tighten up her diet. We discussed this yesterday at her appointment also. We also need to make sure she is drinking plenty of water daily her kidney function has changed some since last year.     We will recheck labs at her next office visit.

## 2021-03-02 ENCOUNTER — Other Ambulatory Visit: Payer: Self-pay

## 2021-03-02 ENCOUNTER — Encounter (INDEPENDENT_AMBULATORY_CARE_PROVIDER_SITE_OTHER): Payer: Self-pay | Admitting: Physician Assistant

## 2021-03-02 ENCOUNTER — Ambulatory Visit (INDEPENDENT_AMBULATORY_CARE_PROVIDER_SITE_OTHER): Payer: HMO | Admitting: Physician Assistant

## 2021-03-02 VITALS — BP 114/74 | HR 85 | Temp 97.7°F | Resp 12 | Ht 61.0 in | Wt 149.8 lb

## 2021-03-02 DIAGNOSIS — F411 Generalized anxiety disorder: Secondary | ICD-10-CM

## 2021-03-02 DIAGNOSIS — E785 Hyperlipidemia, unspecified: Secondary | ICD-10-CM

## 2021-03-02 DIAGNOSIS — F410 Panic disorder [episodic paroxysmal anxiety] without agoraphobia: Secondary | ICD-10-CM

## 2021-03-02 DIAGNOSIS — F320 Major depressive disorder, single episode, mild: Secondary | ICD-10-CM

## 2021-03-02 DIAGNOSIS — L989 Disorder of the skin and subcutaneous tissue, unspecified: Secondary | ICD-10-CM

## 2021-03-02 NOTE — Progress Notes (Signed)
FAMILY MEDICINE, [facility]  [place] [address]       Name: Elizabeth Hunter  MRN:  [id number]       Date: 03/02/2021  DOB:      Age: 81 y.o.             Reason for Visit: Follow Up (Discuss lab results)    History of Present Illness  Elizabeth Hunter is a 81 y.o. female who is being seen today for review of most recent lab work. Pt states also she has been started on metformin 500mg  twice daily, Dr. Tomi Bamberger psychiatrist has recommended due to weight gain with the Zyprexa dose increase. Pt states she seems to be tolerating the metformin well at this time. States she has had some weight loss in the past 1--2 weeks.     Pt had fasting/screening lab work completed on 02/19/21.     Past Medical History:   Diagnosis Date   . AAA (abdominal aortic aneurysm)    . Back ache    . Bowel trouble     obstruction/nonsurgical   . Chronic headaches    . Constipation    . Depression    . Fibromyalgia    . Incomplete defecation    . Osteoarthritis    . Rectocele    . Splenic artery aneurysm (CMS [facility])          Past Surgical History:   Procedure Laterality Date   . HX APPENDECTOMY     . HX CESAREAN SECTION      2   . HX CHOLECYSTECTOMY     . HX COLONOSCOPY     . HX LAP CHOLECYSTECTOMY  2012   . [place] SINUS SURGERY  2014   . HX WISDOM TEETH EXTRACTION           Current Outpatient Medications   Medication Sig   . Ascorbic Acid (VITAMIN C) 500 mg Oral Capsule, Sustained Release Take 1 Capsule (500 mg total) by mouth Once a day   . citalopram (CELEXA) 20 mg Oral Tablet Take 1 Tablet (20 mg total) by mouth Once a day for 90 days   . clindamycin phosphate (CLEOCIN T) 1 % Gel Apply topically Twice daily use thin film on affected area   . meloxicam (MOBIC) 15 mg Oral Tablet Take 1 Tablet (15 mg total) by mouth Once a day   . metFORMIN (GLUCOPHAGE) 500 mg Oral Tablet Take 1 Tablet (500 mg total) by mouth Twice daily with food   . metoprolol tartrate (LOPRESSOR) 25 mg Oral Tablet Take 0.5 Tablets (12.5 mg  total) by mouth Twice daily   . OLANZapine (ZYPREXA) 5 mg Oral Tablet Take 2.5 mg by mouth Every night Increase to 5mg  if tolerated well.     No Known Allergies  Family Medical History:     Problem Relation (Age of Onset)    Heart Attack Father (68)    Hypertension (High Blood Pressure) Mother (46)          Social History     Tobacco Use   . Smoking status: Never   . Smokeless tobacco: Never   Vaping Use   . Vaping Use: Never used   Substance Use Topics   . Alcohol use: No   . Drug use: Yes     Types: Benzodiazepines       Nursing Notes  There are no exam notes on file for this visit.  Review of Systems  Review of Systems   Constitutional: Negative for chills, fever, malaise/fatigue and weight loss.        Weight gain due to medication side effect    HENT: Negative for congestion, ear discharge, ear pain, hearing loss, nosebleeds, sinus pain and sore throat.    Eyes: Negative for blurred vision, photophobia and discharge.   Respiratory: Negative for cough, hemoptysis, sputum production, shortness of breath and wheezing.    Cardiovascular: Negative for chest pain, palpitations and leg swelling.   Gastrointestinal: Negative for abdominal pain, blood in stool, constipation, diarrhea, heartburn, nausea and vomiting.   Genitourinary: Negative for dysuria, frequency, hematuria and urgency.   Musculoskeletal: Negative for back pain, falls, myalgias and neck pain.   Skin: Negative for itching and rash.        Skin lesion   Facial lesion, present for a few weeks   Tried the ointment states it has not helped with symptoms  States the lesion is now dryer at this time    Neurological: Negative for dizziness, tingling, tremors, weakness and headaches.   Endo/Heme/Allergies: Negative for environmental allergies. Does not bruise/bleed easily.   Psychiatric/Behavioral: Positive for depression. Negative for memory loss, substance abuse and suicidal ideas. The patient is nervous/anxious. The patient does not have insomnia.         Physical Exam:  BP 114/74 (Site: Left, Patient Position: Sitting, Cuff Size: Adult)   Pulse 85   Temp 36.5 C (97.7 F)   Resp 12   Ht 1.549 m (5\' 1" )   Wt 67.9 kg (149 lb 12.8 oz)   SpO2 95%   BMI 28.30 kg/m       Physical Exam  Vitals and nursing note reviewed.   Constitutional:       General: She is not in acute distress.     Appearance: She is not diaphoretic.   HENT:      Head: Normocephalic and atraumatic.   Eyes:      Conjunctiva/sclera: Conjunctivae normal.      Pupils: Pupils are equal, round, and reactive to light.   Cardiovascular:      Rate and Rhythm: Normal rate and regular rhythm.      Heart sounds: Normal heart sounds.   Pulmonary:      Effort: Pulmonary effort is normal.      Breath sounds: Normal breath sounds.   Musculoskeletal:         General: Normal range of motion.   Skin:     General: Skin is warm and dry.      Findings: Lesion present.   Neurological:      Mental Status: She is alert and oriented to person, place, and time.      Coordination: Coordination normal.      Gait: Gait is intact.   Psychiatric:         Mood and Affect: Mood and affect normal.         Cognition and Memory: Memory normal.         Judgment: Judgment normal.         Assessment and Plan    ENCOUNTER DIAGNOSES     ICD-10-CM   1. Facial skin lesion  L98.9   2. Generalized anxiety disorder with panic attacks  F41.1    F41.0   3. 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 Hyperlipidemia, unspecified hyperlipidemia type  E78.5      On the day of the encounter, a total  of  30 minutes was spent on this patient encounter including review of historical information, examination, documentation and post-visit activities. The time documented excludes procedural time.    Continue with current medications  We reviewed lab work again today  LDL elevated  Discussed diet  Needs to tighten up diet  Stay active, continue with going to wellness center    We will refer to dermatology for consult for facial lesion     Orders  Placed This Encounter   . Referral to External Provider (AMB)     Follow up: Return in about 2 months (around 04/30/2021), or if symptoms worsen or fail to improve.      This patient was seen independently.    Burgess Estelle, PA-C  03/02/2021, 13:55

## 2021-03-18 ENCOUNTER — Telehealth (INDEPENDENT_AMBULATORY_CARE_PROVIDER_SITE_OTHER): Payer: Self-pay | Admitting: Family Medicine

## 2021-03-18 DIAGNOSIS — L600 Ingrowing nail: Secondary | ICD-10-CM

## 2021-03-24 ENCOUNTER — Other Ambulatory Visit: Payer: Self-pay

## 2021-03-24 ENCOUNTER — Ambulatory Visit (INDEPENDENT_AMBULATORY_CARE_PROVIDER_SITE_OTHER): Payer: HMO | Admitting: VASCULAR SURGERY

## 2021-03-24 ENCOUNTER — Inpatient Hospital Stay (INDEPENDENT_AMBULATORY_CARE_PROVIDER_SITE_OTHER)
Admission: RE | Admit: 2021-03-24 | Discharge: 2021-03-24 | Disposition: A | Discharge status: Home / Self Care | Payer: HMO | Source: Ambulatory Visit

## 2021-03-24 ENCOUNTER — Encounter (INDEPENDENT_AMBULATORY_CARE_PROVIDER_SITE_OTHER): Payer: Self-pay | Admitting: VASCULAR SURGERY

## 2021-03-24 VITALS — BP 144/76 | HR 65 | Ht 61.0 in | Wt 150.3 lb

## 2021-03-24 DIAGNOSIS — I6521 Occlusion and stenosis of right carotid artery: Secondary | ICD-10-CM

## 2021-03-24 DIAGNOSIS — I714 Abdominal aortic aneurysm, without rupture, unspecified: Secondary | ICD-10-CM

## 2021-03-24 DIAGNOSIS — I779 Disorder of arteries and arterioles, unspecified: Secondary | ICD-10-CM

## 2021-03-24 DIAGNOSIS — I728 Aneurysm of other specified arteries: Secondary | ICD-10-CM

## 2021-03-24 DIAGNOSIS — Z8679 Personal history of other diseases of the circulatory system: Secondary | ICD-10-CM

## 2021-03-24 NOTE — Progress Notes (Signed)
Department of Vascular Surgery  [facility]  Outpatient Clinic Progress Note    Date: 03/24/2021  Patient: Harmonii Mustoe  DOB:   PCP: Londell Moh, DO    Chief Complaint:  Abdominal aortic ectasia and carotid disease   Chief Complaint   Patient presents with    Follow Up     1 year f/u  Splenic artery aneurysm   Abd. Duplex aorta - carotid duplex 03/24/2021       Subjective:     HPI: Uniqua Ruscoe is a 81 y.o. White female who presents for follow-up of abdominal aorta ectasia as well as mild carotid disease which is asymptomatic.      She was noted to have a small calcified splenic artery aneurysm measuring 1.4 cm.  This was followed with KUB.    She denies abdominal pain or back pain.  She denies neurologic symptoms consistent with CVA or TIA.    Abdominal duplex performed today reveals the abdominal aorta measured 2.5 cm in maximum dimension.  Splenic aneurysm is noted and stable in size per Dr. Park Pope visualization.    Carotid duplex reveals less than 50% stenosis bilaterally.    The patient denies any new changes in PMHx, PSxH, Social Hx, FHx or current medications.     Review of Systems:  Constitutional: negative for fevers, chills, sweats and fatigue  Eyes: negative for blurred vision or field defects  HEENT: negative for hearing loss  Respiratory: negative for cough and shortness of breath  Cardiovascular: negative for chest pain, negative for claudication  Gastrointestinal: negative for post prandial pain  Genitourinary: negative for dysuria, continues to urinate  Musculoskeletal: As in HPI  Neurological: negative for unilateral weakness or monocular blindness  Integumentary: negative for wounds or ulcers    Objective:    PHYSICAL EXAM:  Vitals: BP (!) 144/76 (Site: Left, Patient Position: Sitting)    Pulse 65    Ht 1.549 m (5\' 1" )    Wt 68.2 kg (150 lb 4.8 oz)    SpO2 96%    BMI 28.40 kg/m       General: AA&O X3. Nontoxic.  Well developed and well nourished in no acute distress   HENT:  Head is normocephalic, atraumatic   Eyes: Pupils equal and round and reactive to light; conjunctiva clear. Sclera without icterus; EOMO grossly intact.    Neck: Normal ROM, Supple, symmetrical  Lungs: Effort normal, clear to auscultation bilaterally.   Cardiovascular: Heart regular rate and rhythm, S1, S2 normal, no murmur, click, rub or gallop  Vascular:  no carotid bruits   Left brachial artery:  2+ (normal),  Right brachial artery:  2+ (normal),    Left radial artery:  2+ (normal),  Right radial artery:  2+ (normal),      Abdomen: Bowel sounds normal; soft, non distended non-tender to palpation, no rebound or guarding present. No palpable masses.  Extremities: no cyanosis or edema  Skin:  Skin warm and dry      DATA:     I have independently reviewed and interpreted available imaging.  My interpretations are summarized below.      DIAGNOSTIC STUDIES REVIEWED:  Abdominal duplex-maximum dimension the abdominal aorta 2.5 cm   Carotid duplex-less than 50% stenosis bilaterally with vertebrals antegrade    Assessment:  Abdominal aortic ectasia  Small calcified splenic artery aneurysm   Mild asymptomatic carotid disease    Plan:  She does not require any further surveillance of her abdominal aortic or splenic artery.  She will follow in 1 year with a carotid duplex for continued surveillance.    The patient was given the opportunity to ask questions and those questions were answered to their satisfaction. Instructed to call with any further questions or concerns.     This was a shared visit with Dr. Donnie Mesa.  He was present for the examination and is in agreement with the above-stated plan.    I independently of the faculty provider spent a total of 15 minutes in direct/indirect care of this patient including initial evaluation, review of laboratory, radiology, diagnostic studies, review of medical record, order entry and coordination of care.    Christene Lye, PA-C  03/24/2021, 15:07    I personally saw and evaluated  the patient as part of a shared service with an APP.    My substantive findings are:  MDM (complete) the patient is a 81 year old lady who has a diagnosis abdominal aortic ectasia plus some mild carotid disease.  No family history of abdominal aortic aneurysm disease.  No personal history of stroke, amaurosis fugax, expressive aphasia or transient ischemic attack.  The patient also has a very small calcified splenic artery aneurysm.  We did not get a KUB.  However I did look at it with ultrasound and it is calcified and very small in size.  As she is postmenopausal I do not see a reason for further evaluation.  Were not going to re-evaluate her abdominal aortic ectasia either.  We will see her back in about a year with a repeat carotid duplex.  We do recommend ongoing treatment with aspirin therapy.  Follow-up 1 year    I independently of the APP spent a total of (30) minutes in direct/indirect care of this patient including initial evaluation, review of laboratory, radiology, diagnostic studies, review of medical record, order entry and coordination of care.

## 2021-03-26 ENCOUNTER — Other Ambulatory Visit (INDEPENDENT_AMBULATORY_CARE_PROVIDER_SITE_OTHER): Payer: Self-pay | Admitting: Physician Assistant

## 2021-04-06 ENCOUNTER — Ambulatory Visit (INDEPENDENT_AMBULATORY_CARE_PROVIDER_SITE_OTHER): Payer: HMO | Admitting: Foot & Ankle Surgery

## 2021-04-06 ENCOUNTER — Encounter (INDEPENDENT_AMBULATORY_CARE_PROVIDER_SITE_OTHER): Payer: Self-pay | Admitting: Foot & Ankle Surgery

## 2021-04-06 ENCOUNTER — Other Ambulatory Visit: Payer: Self-pay

## 2021-04-06 DIAGNOSIS — L03032 Cellulitis of left toe: Secondary | ICD-10-CM

## 2021-04-06 DIAGNOSIS — L600 Ingrowing nail: Secondary | ICD-10-CM

## 2021-04-06 MED ORDER — LIDOCAINE HCL 20 MG/ML (2 %) INJECTION SOLUTION
3.0000 mL | Freq: Once | INTRAMUSCULAR | Status: AC
Start: 2021-04-06 — End: 2021-04-06
  Administered 2021-04-06: 60 mg via INTRADERMAL

## 2021-04-06 NOTE — Procedures (Signed)
[facility]ressa St[facility] [address]  [place] [address]    Procedure Note    Name: Elizabeth Hunter MRN:  [id number]   Date: 04/06/2021 Age: 81 y.o.       11730 - AVULSION OF NAIL PLATE, PARTIAL/COMPLETE, SIMPLE; SINGLE (AMB ONLY)  Performed by: Wyn Quaker, DPM  Authorized by: Dairl Ponder, [facility] [address]  [place] [address]            Name: Elizabeth Hunter MRN:  [id number]   Date: 04/06/2021 Age: 81 y.o.         Chief Complaint:   Chief Complaint   Patient presents with   . Establish Care     New Patient; c/o ingrown left great toe       Subjective  Elizabeth Hunter is a 81 y.o. female who presents with complaint of a painful ingrown toenail left great toe.  Pain with applied pressure.  No current treatment      Objective    On physical examination Elizabeth Hunter is seated comfortably in the examination room in no apparent distress.  Alert and oriented to time and place. Mood and affect are normal and appropriate to situation.  She  appears well developed, well nourished, with good attention to hygiene and body habitus.    Skin:  Left hallux medial nail border incurvated mild localized edema very faint erythema.  No drainage.  Pain with dorsal pressure on the nail  Vascular:  DP and PT pulses are palpable 2/4 on the left.  CFT is immediate to all toes        Assessment/ Plan  Elizabeth Hunter was seen today for establish care.    Diagnoses and all orders for this visit:    Paronychia of toe of left foot  -     11730 - AVULSION OF NAIL PLATE, PARTIAL/COMPLETE, SIMPLE; SINGLE (AMB ONLY)    Ingrown nail  -     Refer to [facility] Podiatry, [facility] 4  -     [phone] - AVULSION OF NAIL PLATE, PARTIAL/COMPLETE, SIMPLE; SINGLE (AMB ONLY)    Other orders  -     lidocaine 2% injection        1. I discussed treatment options for the patients ingrown toenail.  Due to the level of infection that is present I recommend a partial, temporary avulsion.  I  discussed the procedure, risks, and anticipated recovery course.  Anesthesia, 3 cc's 1% lidocaine plain.  Betadine prep.  A partial nail avulsion performed using an English anvil, a freer elevator, and a straight hemostat left hallux medial nail border.  Bacitracin ointment and a dressing applied.  Post procedure instructions discussed.  The patient is advised to call for follow-up appointment if pain, redness, drainage, swelling fail to resolve over the course of the next week.  The patient verbalized understanding.    Wyn Quaker, DPM        This note was partially created using M*Modal fluency direct system (voice recognition software ) and is inherently subject to errors including those of syntax and "sound- alike" substitutions which may escape proofreading.  In such instances, , original meaning may be extrapolated by contextual derivation.     Wyn Quaker, DPM

## 2021-05-21 ENCOUNTER — Encounter (INDEPENDENT_AMBULATORY_CARE_PROVIDER_SITE_OTHER): Payer: Self-pay | Admitting: Physician Assistant

## 2021-05-21 ENCOUNTER — Ambulatory Visit (INDEPENDENT_AMBULATORY_CARE_PROVIDER_SITE_OTHER): Payer: HMO | Admitting: Physician Assistant

## 2021-05-21 ENCOUNTER — Other Ambulatory Visit: Payer: Self-pay

## 2021-05-21 VITALS — BP 112/62 | HR 67 | Temp 97.8°F | Resp 12 | Ht 61.0 in | Wt 142.6 lb

## 2021-05-21 DIAGNOSIS — F410 Panic disorder [episodic paroxysmal anxiety] without agoraphobia: Secondary | ICD-10-CM

## 2021-05-21 DIAGNOSIS — R000 Tachycardia, unspecified: Secondary | ICD-10-CM

## 2021-05-21 DIAGNOSIS — R002 Palpitations: Secondary | ICD-10-CM

## 2021-05-21 DIAGNOSIS — F411 Generalized anxiety disorder: Secondary | ICD-10-CM

## 2021-05-21 DIAGNOSIS — I10 Essential (primary) hypertension: Secondary | ICD-10-CM

## 2021-05-21 DIAGNOSIS — M5136 Other intervertebral disc degeneration, lumbar region: Secondary | ICD-10-CM

## 2021-05-21 MED ORDER — METOPROLOL TARTRATE 25 MG TABLET
12.5000 mg | ORAL_TABLET | Freq: Two times a day (BID) | ORAL | 3 refills | Status: DC
Start: 2021-05-21 — End: 2021-05-27

## 2021-05-21 MED ORDER — MELOXICAM 15 MG TABLET
15.0000 mg | ORAL_TABLET | Freq: Every day | ORAL | 3 refills | Status: DC
Start: 2021-05-21 — End: 2021-05-27

## 2021-05-21 NOTE — Progress Notes (Signed)
FAMILY MEDICINE, [facility]  [address]  [place] [id number]       Name: Elizabeth Overliedith Hammett  MRN:  [id number]       Date: 05/21/2021  DOB:      Age: 81 y.o.             Reason for Visit: Follow Up (Check up)    History of Present Illness  Elizabeth Hunter is a 81 y.o. female who is being seen today for 3 month follow up. States overall she is feeling well. States she does have a few side effects to the medication Zyprexa. States she has discussed with her psychiatrist Dr. Germaine Pomfretorder. Pt states the medication has greatly helped with her anxiety. States she is willing to deal with the side effect of weight gain and a bit of fatigue at this time. Pt states she is looking forward to up coming trip home to [place]. Pt states she and her daughter are going at the end of May 2023. Pt request medication refill for 90 days. States she would like to try [facility] scripts.     Past Medical History:   Diagnosis Date   . AAA (abdominal aortic aneurysm) (CMS HCC)    . Back ache    . Bowel trouble     obstruction/nonsurgical   . Chronic headaches    . Constipation    . Depression    . Fibromyalgia    . Incomplete defecation    . Osteoarthritis    . Rectocele    . Splenic artery aneurysm ([facility])          Past Surgical History:   Procedure Laterality Date   . HX APPENDECTOMY     . HX CESAREAN SECTION      2   . HX CHOLECYSTECTOMY     . HX COLONOSCOPY     . HX LAP CHOLECYSTECTOMY  2012   . HX SINUS SURGERY  2014   . HX WISDOM TEETH EXTRACTION           Current Outpatient Medications   Medication Sig   . escitalopram oxalate (LEXAPRO) 20 mg Oral Tablet Take 1 Tablet (20 mg total) by mouth Once a day   . meloxicam (MOBIC) 15 mg Oral Tablet Take 1 Tablet (15 mg total) by mouth Once a day   . metoprolol tartrate (LOPRESSOR) 25 mg Oral Tablet Take 0.5 Tablets (12.5 mg total) by mouth Twice daily   . OLANZapine (ZYPREXA) 5 mg Oral Tablet Take 1.5 Tablets (7.5 mg total) by mouth Every night Increase to 5mg  if  tolerated well.     No Known Allergies  Family Medical History:     Problem Relation (Age of Onset)    Heart Attack Father (68)    Hypertension (High Blood Pressure) Mother 50(66)          Social History     Tobacco Use   . Smoking status: Never   . Smokeless tobacco: Never   Vaping Use   . Vaping Use: Never used   Substance Use Topics   . Alcohol use: No   . Drug use: Yes     Types: Benzodiazepines       Nursing Notes  There are no exam notes on file for this visit.     Review of Systems  Review of Systems   Constitutional: Positive for malaise/fatigue. Negative for chills, fever and weight loss.        Pt  states the Zyprexa causes her to feel more fatigued    HENT: Negative for congestion, ear discharge, ear pain, hearing loss, nosebleeds, sinus pain and sore throat.    Eyes: Negative for blurred vision, photophobia and discharge.   Respiratory: Negative for cough, hemoptysis, sputum production, shortness of breath and wheezing.    Cardiovascular: Negative for chest pain, palpitations and leg swelling.   Gastrointestinal: Negative for abdominal pain, blood in stool, constipation, diarrhea, heartburn, nausea and vomiting.   Genitourinary: Negative for dysuria, frequency, hematuria and urgency.   Musculoskeletal: Positive for back pain and joint pain. Negative for falls, myalgias and neck pain.   Skin: Negative for itching and rash.   Neurological: Negative for dizziness, tingling, tremors, weakness and headaches.   Endo/Heme/Allergies: Negative for environmental allergies. Does not bruise/bleed easily.   Psychiatric/Behavioral: Positive for depression. Negative for memory loss, substance abuse and suicidal ideas. The patient is nervous/anxious. The patient does not have insomnia.        Physical Exam:  BP 112/62 (Site: Right, Patient Position: Sitting, Cuff Size: Adult)   Pulse 67   Temp 36.6 C (97.8 F)   Resp 12   Ht 1.549 m (5\' 1" )   Wt 64.7 kg (142 lb 9.6 oz)   SpO2 96%   BMI 26.94 kg/m       Physical  Exam  Vitals and nursing note reviewed.   Constitutional:       General: She is not in acute distress.     Appearance: She is not diaphoretic.   HENT:      Head: Normocephalic and atraumatic.   Eyes:      Conjunctiva/sclera: Conjunctivae normal.      Pupils: Pupils are equal, round, and reactive to light.   Cardiovascular:      Rate and Rhythm: Normal rate and regular rhythm.      Heart sounds: Normal heart sounds.   Pulmonary:      Effort: Pulmonary effort is normal.      Breath sounds: Normal breath sounds.   Skin:     General: Skin is warm and dry.   Neurological:      Mental Status: She is alert and oriented to person, place, and time.      Coordination: Coordination normal.      Gait: Gait is intact.   Psychiatric:         Mood and Affect: Mood and affect normal.         Behavior: Behavior normal.         Thought Content: Thought content normal.         Cognition and Memory: Memory normal.         Judgment: Judgment normal.         Assessment and Plan    ENCOUNTER DIAGNOSES     ICD-10-CM   1. Generalized anxiety disorder with panic attacks  F41.1    F41.0   2. Primary hypertension  I10   3. Palpitations  R00.2   4. Tachycardia  R00.0   5. DDD (degenerative disc disease), lumbar  M51.36      On the day of the encounter, a total of  30 minutes was spent on this patient encounter including review of historical information, examination, documentation and post-visit activities. The time documented excludes procedural time.    Refilled medications  No changes were made at today's office visit  Stay active  Balanced diet  Keep f/u appointment with psychiatry   F/u in about  3 months, sooner if needed    Orders Placed This Encounter   . metoprolol tartrate (LOPRESSOR) 25 mg Oral Tablet   . meloxicam (MOBIC) 15 mg Oral Tablet     Follow up: Return in about 3 months (around 08/20/2021), or if symptoms worsen or fail to improve.      This patient was seen independently.    Steva Ready, PA-C  05/21/2021, 13:06

## 2021-05-27 ENCOUNTER — Other Ambulatory Visit (INDEPENDENT_AMBULATORY_CARE_PROVIDER_SITE_OTHER): Payer: Self-pay | Admitting: Family Medicine

## 2021-05-27 DIAGNOSIS — R000 Tachycardia, unspecified: Secondary | ICD-10-CM

## 2021-05-27 DIAGNOSIS — M5136 Other intervertebral disc degeneration, lumbar region: Secondary | ICD-10-CM

## 2021-05-27 DIAGNOSIS — I10 Essential (primary) hypertension: Secondary | ICD-10-CM

## 2021-05-27 DIAGNOSIS — R002 Palpitations: Secondary | ICD-10-CM

## 2021-05-27 MED ORDER — MELOXICAM 15 MG TABLET
15.0000 mg | ORAL_TABLET | Freq: Every day | ORAL | 3 refills | Status: DC
Start: 2021-05-27 — End: 2021-07-02

## 2021-05-27 MED ORDER — METOPROLOL TARTRATE 25 MG TABLET
12.5000 mg | ORAL_TABLET | Freq: Two times a day (BID) | ORAL | 3 refills | Status: DC
Start: 2021-05-27 — End: 2022-03-08

## 2021-05-27 NOTE — Telephone Encounter (Signed)
She also gets the Lexapro from Dr. Consuella Lose office.

## 2021-05-27 NOTE — Telephone Encounter (Signed)
The patient will need to request Zyprexa from Dr. Consuella Lose office. I discussed this with her at there last office visit.

## 2021-07-02 ENCOUNTER — Other Ambulatory Visit (INDEPENDENT_AMBULATORY_CARE_PROVIDER_SITE_OTHER): Payer: Self-pay | Admitting: Physician Assistant

## 2021-07-02 DIAGNOSIS — M5136 Other intervertebral disc degeneration, lumbar region: Secondary | ICD-10-CM

## 2021-08-20 ENCOUNTER — Other Ambulatory Visit: Payer: Self-pay

## 2021-08-20 ENCOUNTER — Encounter (INDEPENDENT_AMBULATORY_CARE_PROVIDER_SITE_OTHER): Payer: Self-pay | Admitting: Physician Assistant

## 2021-08-20 ENCOUNTER — Ambulatory Visit (INDEPENDENT_AMBULATORY_CARE_PROVIDER_SITE_OTHER): Payer: HMO | Admitting: Physician Assistant

## 2021-08-20 VITALS — BP 120/68 | HR 66 | Temp 98.2°F | Ht 61.0 in | Wt 144.0 lb

## 2021-08-20 DIAGNOSIS — F411 Generalized anxiety disorder: Secondary | ICD-10-CM

## 2021-08-20 DIAGNOSIS — F320 Major depressive disorder, single episode, mild: Secondary | ICD-10-CM

## 2021-08-20 DIAGNOSIS — M545 Low back pain, unspecified: Secondary | ICD-10-CM

## 2021-08-20 DIAGNOSIS — M5136 Other intervertebral disc degeneration, lumbar region: Secondary | ICD-10-CM

## 2021-08-20 DIAGNOSIS — F4321 Adjustment disorder with depressed mood: Secondary | ICD-10-CM

## 2021-08-20 DIAGNOSIS — F410 Panic disorder [episodic paroxysmal anxiety] without agoraphobia: Secondary | ICD-10-CM

## 2021-08-20 DIAGNOSIS — L989 Disorder of the skin and subcutaneous tissue, unspecified: Secondary | ICD-10-CM

## 2021-08-20 NOTE — Progress Notes (Signed)
FAMILY MEDICINE, [facility]  [address]  [place] [address]       Name: Elizabeth Hunter  MRN:  [id number]       Date: 08/20/2021  DOB:      Age: 81 y.o.             Reason for Visit: Follow Up 3 Months    History of Present Illness  Elizabeth Hunter is a 81 y.o. female who is being seen today for 3 month follow up depression and anxiety. Pt states she is doing ok at this time. Reports she had a fun trip to [place] to visit her family. States her and her daughter enjoyed their time. Pt reports she is feeling lonely at home. Husband passed away 1 year ago in May 2022. Pt states she just feels lonely and miss him. Pt states she stays active enjoys time spent with friends at least 3 times weekly. States she is eating well. Reports she is sleeping well. Pt states she is tolerating medications well at this time. States she doesn't like having weight gain due to Zyprexa but states it does help her fell better so has decided to continue to take medication. States she continues to follow with Dr. Germaine Pomfret. Pt denies SI. Pt denies HI.     Pt c/o facial lesion. States she has now had for months. States she had referral to dermatology but never had appointment made. States the lesion will not heal on her right side of face.     Past Medical History:   Diagnosis Date   . AAA (abdominal aortic aneurysm) (CMS HCC)    . Back ache    . Bowel trouble     obstruction/nonsurgical   . Chronic headaches    . Constipation    . Depression    . Fibromyalgia    . Incomplete defecation    . Osteoarthritis    . Rectocele    . Splenic artery aneurysm (CMS [facility])          Past Surgical History:   Procedure Laterality Date   . HX APPENDECTOMY     . HX CESAREAN SECTION      2   . HX CHOLECYSTECTOMY     . HX COLONOSCOPY     . HX LAP CHOLECYSTECTOMY  2012   . HX SINUS SURGERY  2014   . HX WISDOM TEETH EXTRACTION           Current Outpatient Medications   Medication Sig   . escitalopram oxalate (LEXAPRO) 20 mg Oral  Tablet Take 1 Tablet (20 mg total) by mouth Once a day   . meloxicam (MOBIC) 15 mg Oral Tablet TAKE 1 TABLET BY MOUTH EVERY DAY   . metoprolol tartrate (LOPRESSOR) 25 mg Oral Tablet Take 0.5 Tablets (12.5 mg total) by mouth Twice daily   . OLANZapine (ZYPREXA) 5 mg Oral Tablet Take 1.5 Tablets (7.5 mg total) by mouth Every night Increase to 5mg  if tolerated well.     No Known Allergies  Family Medical History:     Problem Relation (Age of Onset)    Heart Attack Father (68)    Hypertension (High Blood Pressure) Mother (12)          Social History     Tobacco Use   . Smoking status: Never   . Smokeless tobacco: Never   Vaping Use   . Vaping Use: Never used   Substance Use Topics   .  Alcohol use: No   . Drug use: Yes     Types: Benzodiazepines       Nursing Notes  There are no exam notes on file for this visit.     Review of Systems  Review of Systems   Constitutional: Negative for chills, fever, malaise/fatigue and weight loss.   HENT: Negative for congestion, ear discharge, ear pain, hearing loss, nosebleeds, sinus pain and sore throat.    Eyes: Negative for blurred vision, photophobia and discharge.   Respiratory: Negative for cough, hemoptysis, sputum production, shortness of breath and wheezing.    Cardiovascular: Negative for chest pain, palpitations and leg swelling.   Gastrointestinal: Negative for abdominal pain, blood in stool, constipation, diarrhea, heartburn, nausea and vomiting.   Genitourinary: Negative for dysuria, frequency, hematuria and urgency.   Musculoskeletal: Positive for back pain. Negative for falls, joint pain, myalgias and neck pain.        Low back pain  Controlled at this time with Mobic    Skin: Negative for itching and rash.        Lesion  Facial lesion    Neurological: Negative for dizziness, tingling, tremors, weakness and headaches.   Endo/Heme/Allergies: Negative for environmental allergies. Does not bruise/bleed easily.   Psychiatric/Behavioral: Positive for depression. Negative for  memory loss, substance abuse and suicidal ideas. The patient is nervous/anxious and has insomnia.         Grief   Lonely        Physical Exam:  BP 120/68   Pulse 66   Temp 36.8 C (98.2 F)   Ht 1.549 m (5\' 1" )   Wt 65.3 kg (144 lb)   SpO2 98%   BMI 27.21 kg/m       Physical Exam  Vitals and nursing note reviewed.   Constitutional:       General: She is not in acute distress.     Appearance: She is not diaphoretic.   HENT:      Head: Normocephalic and atraumatic.   Eyes:      Conjunctiva/sclera: Conjunctivae normal.      Pupils: Pupils are equal, round, and reactive to light.   Cardiovascular:      Rate and Rhythm: Normal rate and regular rhythm.      Heart sounds: Normal heart sounds.   Pulmonary:      Effort: Pulmonary effort is normal.      Breath sounds: Normal breath sounds.   Musculoskeletal:      Thoracic back: Deformity present. Decreased range of motion.      Lumbar back: Decreased range of motion.   Skin:     General: Skin is warm and dry.   Neurological:      Mental Status: She is alert and oriented to person, place, and time.      Coordination: Coordination normal.      Gait: Gait is intact.   Psychiatric:         Mood and Affect: Mood and affect normal.         Cognition and Memory: Memory normal.         Judgment: Judgment normal.         Assessment and Plan    ENCOUNTER DIAGNOSES     ICD-10-CM   1. Facial skin lesion  L98.9   2. Generalized anxiety disorder with panic attacks  F41.1    F41.0   3. 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 Grief  F43.21  5. Low back pain  M54.50   6. DDD (degenerative disc disease), lumbar  M51.36      On the day of the encounter, a total of  30 minutes was spent on this patient encounter including review of historical information, examination, documentation and post-visit activities. The time documented excludes procedural time.    Facial lesion  Referral for dermatology re-submitted today    Depression/anxiety  Continue with current medications  Keep f/u  appointment with psychiatry  Continue to receive support from friends and family  Stay active  Balanced diet    Low back pain/ lumbar DDD  Continue with Mobic  Stay active      Orders Placed This Encounter   . Referral to External Provider (AMB)     Follow up: Return in about 3 months (around 11/20/2021), or if symptoms worsen or fail to improve.      This patient was seen independently.    Steva Ready, PA-C  08/20/2021, 13:00

## 2021-08-21 ENCOUNTER — Telehealth (INDEPENDENT_AMBULATORY_CARE_PROVIDER_SITE_OTHER): Payer: Self-pay | Admitting: Physician Assistant

## 2021-08-21 NOTE — Telephone Encounter (Signed)
08/21/21 pc lvm for edie with appt date and time for [facility]  Dermatology  10/13/21 at 1pm with Mar Daring  Pa-c arrive at 12:45 pm  [address] [place], [place]

## 2021-09-14 ENCOUNTER — Other Ambulatory Visit: Payer: Self-pay

## 2021-09-14 ENCOUNTER — Encounter (INDEPENDENT_AMBULATORY_CARE_PROVIDER_SITE_OTHER): Payer: Self-pay | Admitting: Foot & Ankle Surgery

## 2021-09-14 ENCOUNTER — Ambulatory Visit (INDEPENDENT_AMBULATORY_CARE_PROVIDER_SITE_OTHER): Payer: HMO | Admitting: Foot & Ankle Surgery

## 2021-09-14 DIAGNOSIS — L600 Ingrowing nail: Secondary | ICD-10-CM

## 2021-09-14 NOTE — Progress Notes (Signed)
Theressa Stamps [facility] [address]  [place] [address]            Name: Almadelia Looman MRN:  [id number]   Date: 09/14/2021 Age: 81 y.o.         Chief Complaint:   Chief Complaint   Patient presents with    Paronychia     Had Paronychia of left foot, claims it has been giving her problems again but feels okay now, still would like it to be checked out again.        Subjective  Tanvi Gatling is a 81 y.o. female who presents to clinic for evaluation of an ingrown toenail left great toe.  She says that when she made the appointment she was having some significant pain.  Since making the appointment pain is resolved.      Objective    On physical examination Aitanna Haubner is seated comfortably in the examination room in no apparent distress.  Alert and oriented to time and place. Mood and affect are normal and appropriate to situation.  She  appears well developed, well nourished, with good attention to hygiene and body habitus.    Skin:  Left hallux medial nail borders incurvated no erythema no drainage no edema  Musculoskeletal:  No Misalignment left hallux  Neurological:  Gross sensation intact  Vascular:  Pedal pulses are intact left.  CFT is immediate to all toes        Assessment/ Plan  Tierney was seen today for paronychia.    Diagnoses and all orders for this visit:    Ingrown nail        1. Today I explained the etiology, prognosis, and treatment options for ingrown toenail left great toe medial nail border.  No current signs of infection.  She says pain is now resolved.  I discussed options with her to include monitoring versus partial permanent nail avulsion left hallux medial nail border.  The patient wishes to monitor.  She will call for follow-up as needed.        Wyn Quaker, DPM        This note was partially created using M*Modal fluency direct system (voice recognition software ) and is inherently subject to errors including those of syntax and "sound- alike" substitutions which  may escape proofreading.  In such instances, , original meaning may be extrapolated by contextual derivation.

## 2021-11-27 ENCOUNTER — Encounter (INDEPENDENT_AMBULATORY_CARE_PROVIDER_SITE_OTHER): Payer: Self-pay | Admitting: Physician Assistant

## 2021-11-27 ENCOUNTER — Ambulatory Visit (INDEPENDENT_AMBULATORY_CARE_PROVIDER_SITE_OTHER): Payer: HMO | Admitting: Physician Assistant

## 2021-11-27 ENCOUNTER — Other Ambulatory Visit: Payer: Self-pay

## 2021-11-27 VITALS — BP 122/70 | HR 66 | Temp 97.9°F | Ht 61.0 in | Wt 147.0 lb

## 2021-11-27 DIAGNOSIS — H6123 Impacted cerumen, bilateral: Secondary | ICD-10-CM

## 2021-11-27 DIAGNOSIS — F410 Panic disorder [episodic paroxysmal anxiety] without agoraphobia: Secondary | ICD-10-CM

## 2021-11-27 DIAGNOSIS — F4321 Adjustment disorder with depressed mood: Secondary | ICD-10-CM

## 2021-11-27 DIAGNOSIS — Z23 Encounter for immunization: Secondary | ICD-10-CM

## 2021-11-27 DIAGNOSIS — R002 Palpitations: Secondary | ICD-10-CM

## 2021-11-27 DIAGNOSIS — Z0000 Encounter for general adult medical examination without abnormal findings: Secondary | ICD-10-CM

## 2021-11-27 DIAGNOSIS — F411 Generalized anxiety disorder: Secondary | ICD-10-CM

## 2021-11-27 DIAGNOSIS — Z0001 Encounter for general adult medical examination with abnormal findings: Secondary | ICD-10-CM

## 2021-11-27 DIAGNOSIS — I10 Essential (primary) hypertension: Secondary | ICD-10-CM

## 2021-11-27 DIAGNOSIS — F321 Major depressive disorder, single episode, moderate: Secondary | ICD-10-CM

## 2021-11-27 NOTE — Nursing Note (Signed)
11/27/21 1307   Activities of Daily Living   Do you need help with dressing, bathing, or walking? No   Do you need help with shopping, housekeeping, medications, or finances? No   Do you have rugs in hallways, broken steps, or poor lighting? No   Do you have grab bars in your bathroom, non-slip strips in your tub, and hand rails on your stairs? Yes   Urinary Incontinence Screen   Do you ever leak urine when you don't want to? No   Cognitive Function Screen   What is you age? 1   What is the time to the nearest hour? 1   What is the year? 1   What is the name of this clinic? 1   Can the patient recognize two persons (the doctor, the nurse, home help, etc.)? 1   What is the date of your birth? (day and month sufficient)  1   In what year did World War II end? 1   Who is the current president of the [place]? 1   Count from 20 down to 1? 1   What address did I give you earlier? 1   Total Score 10   Depression Screen   Little interest or pleasure in doing things. 1   Feeling down, depressed, or hopeless 1   PHQ 2 Total 2   Pain Score   Pain Score Zero   Substance Use Screening   In Past 12 MONTHS, how often have you used any tobacco product (for example, cigarettes, e-cigarettes, cigars, pipes, or smokeless tobacco)? Never   In the PAST 12 MONTHS, how often have you had 5 (men)/4 (women) or more drinks containing alcohol in one day? Never   In the PAST 12 months, how often have you used any prescription medications just for the feeling, more than prescribed, or that were not prescribed for you? Prescriptions may include: opioids, benzodiazepines, medications for ADHD Never   Hearing Screen   Have you noticed any hearing difficulties? Yes   After whispering 9-1-6 how many numbers did the patient repeat correctly? 3   Fall Risk Assessment   Do you feel unsteady when standing or walking? No   Do you worry about falling? No   Have you fallen in the past year? No   Vision Screen   Right Eye = 20 0.67   Left Eye = 20  0.5

## 2021-11-27 NOTE — Progress Notes (Signed)
FAMILY MEDICINE, [facility]  [address]   [place] [address]       Name: Elizabeth Hunter  MRN:  [id number]       Date: 11/27/2021  DOB:      Age: 81 y.o.             Reason for Visit: Medicare Annual, Ear Fullness, Follow Up 3 Months, and Flu Vaccine (Fluad/)    History of Present Illness  Elizabeth Hunter is a 81 y.o. female who is being seen today for medicare wellness visit. Pt also c/o ear fullness. States both ears feel full and muffled. Pt has history of wax impaction. Pt also request flu shot today. Pt also following up for 3 month follow up. States mental health is stable at this time. States she will see psychiatry next week. Pt states she has continued with the medication. States Dr. Germaine Pomfret did try her on Wellbutrin. Pt states she did not see any changes or improvement so has stopped the medication.     Patient rates their pain a 0 out of 10 on the pain scale.    Past Medical History:   Diagnosis Date    AAA (abdominal aortic aneurysm) (CMS HCC)     Back ache     Bowel trouble     obstruction/nonsurgical    Chronic headaches     Constipation     Depression     Fibromyalgia     Incomplete defecation     Osteoarthritis     Rectocele     Splenic artery aneurysm (CMS HCC)          Past Surgical History:   Procedure Laterality Date    HX APPENDECTOMY      HX CESAREAN SECTION      2    HX CHOLECYSTECTOMY      HX COLONOSCOPY      HX LAP CHOLECYSTECTOMY  2012    HX SINUS SURGERY  2014    HX WISDOM TEETH EXTRACTION           Current Outpatient Medications   Medication Sig    escitalopram oxalate (LEXAPRO) 20 mg Oral Tablet Take 1 Tablet (20 mg total) by mouth Once a day    meloxicam (MOBIC) 15 mg Oral Tablet TAKE 1 TABLET BY MOUTH EVERY DAY    metoprolol tartrate (LOPRESSOR) 25 mg Oral Tablet Take 0.5 Tablets (12.5 mg total) by mouth Twice daily    OLANZapine (ZYPREXA) 5 mg Oral Tablet Take 1.5 Tablets (7.5 mg total) by mouth Every night Increase to 5mg  if tolerated well.     No  Known Allergies  Family Medical History:       Problem Relation (Age of Onset)    Heart Attack Father (51)    Hypertension (High Blood Pressure) Mother (83)            Social History     Tobacco Use    Smoking status: Never    Smokeless tobacco: Never   Vaping Use    Vaping Use: Never used   Substance Use Topics    Alcohol use: No    Drug use: Yes     Types: Benzodiazepines       Nursing Notes  Nursing Notes:   (76, [place]  11/27/21 1314  Signed     11/27/21 1307   Activities of Daily Living   Do you need help with dressing, bathing, or walking? No   Do you  need help with shopping, housekeeping, medications, or finances? No   Do you have rugs in hallways, broken steps, or poor lighting? No   Do you have grab bars in your bathroom, non-slip strips in your tub, and hand rails on your stairs? Yes   Urinary Incontinence Screen   Do you ever leak urine when you don't want to? No   Cognitive Function Screen   What is you age? 1   What is the time to the nearest hour? 1   What is the year? 1   What is the name of this clinic? 1   Can the patient recognize two persons (the doctor, the nurse, home help, etc.)? 1   What is the date of your birth? (day and month sufficient)  1   In what year did World War II end? 1   Who is the current president of the [place]? 1   Count from 20 down to 1? 1   What address did I give you earlier? 1   Total Score 10   Depression Screen   Little interest or pleasure in doing things. 1   Feeling down, depressed, or hopeless 1   PHQ 2 Total 2   Pain Score   Pain Score Zero   Substance Use Screening   In Past 12 MONTHS, how often have you used any tobacco product (for example, cigarettes, e-cigarettes, cigars, pipes, or smokeless tobacco)? Never   In the PAST 12 MONTHS, how often have you had 5 (men)/4 (women) or more drinks containing alcohol in one day? Never   In the PAST 12 months, how often have you used any prescription medications just for the feeling, more than prescribed, or  that were not prescribed for you? Prescriptions may include: opioids, benzodiazepines, medications for ADHD Never   Hearing Screen   Have you noticed any hearing difficulties? Yes   After whispering 9-1-6 how many numbers did the patient repeat correctly? 3   Fall Risk Assessment   Do you feel unsteady when standing or walking? No   Do you worry about falling? No   Have you fallen in the past year? No   Vision Screen   Right Eye = 20 0.67   Left Eye = 20 0.5          Review of Systems  Review of Systems   Constitutional:  Negative for chills, fever, malaise/fatigue and weight loss.   HENT:  Negative for congestion, ear discharge, ear pain, hearing loss, nosebleeds, sinus pain and sore throat.    Eyes:  Negative for blurred vision, photophobia and discharge.   Respiratory:  Negative for cough, hemoptysis, sputum production, shortness of breath and wheezing.    Cardiovascular:  Negative for chest pain, palpitations and leg swelling.   Gastrointestinal:  Negative for abdominal pain, blood in stool, constipation, diarrhea, heartburn, nausea and vomiting.   Genitourinary:  Negative for dysuria, frequency, hematuria and urgency.   Musculoskeletal:  Positive for back pain and joint pain. Negative for falls, myalgias and neck pain.   Skin:  Negative for itching and rash.   Neurological:  Negative for dizziness, tingling, tremors, weakness and headaches.   Endo/Heme/Allergies:  Negative for environmental allergies. Does not bruise/bleed easily.   Psychiatric/Behavioral:  Positive for depression. Negative for memory loss, substance abuse and suicidal ideas. The patient is nervous/anxious and has insomnia.         Insomnia:   stable doing well with sleep  Anxiety:   stable with  the help of medication   Following with Dr. Tomi Bamberger, will f/u next week  States feels she is on the edge of panic attack   Depression:   Stable with the medication  Functioning          Physical Exam:  BP 122/70   Pulse 66   Temp 36.6 C (97.9 F)    Ht 1.549 m (5\' 1" )   Wt 66.7 kg (147 lb)   SpO2 97%   BMI 27.78 kg/m       Physical Exam  Vitals and nursing note reviewed.   Constitutional:       General: She is not in acute distress.     Appearance: She is not diaphoretic.   HENT:      Head: Normocephalic and atraumatic.      Right Ear: There is impacted cerumen.      Left Ear: There is impacted cerumen.   Eyes:      Conjunctiva/sclera: Conjunctivae normal.      Pupils: Pupils are equal, round, and reactive to light.   Cardiovascular:      Rate and Rhythm: Normal rate and regular rhythm.      Heart sounds: Normal heart sounds.   Pulmonary:      Effort: Pulmonary effort is normal.      Breath sounds: Normal breath sounds.   Musculoskeletal:         General: Normal range of motion.   Skin:     General: Skin is warm and dry.   Neurological:      Mental Status: She is alert and oriented to person, place, and time.      Coordination: Coordination normal.      Gait: Gait is intact.   Psychiatric:         Mood and Affect: Mood and affect normal.         Cognition and Memory: Memory normal.         Judgment: Judgment normal.         Assessment and Plan    ENCOUNTER DIAGNOSES     ICD-10-CM   1. Encounter for Medicare annual wellness exam  Z00.00   2. 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 Need for vaccination  Z23   4. Generalized anxiety disorder with panic attacks  F41.1    F41.0   5. Grief  F43.21   6. Palpitations  R00.2   7. Primary hypertension  I10   8. Bilateral impacted cerumen  H61.23      On the day of the encounter, a total of  30 minutes was spent on this patient encounter including review of historical information, examination, documentation and post-visit activities. The time documented excludes procedural time.     Medicare wellness visit completed  Form scanned into chart  Flu shot given today    GAD:  Continue with current medications  Keep f/u with psychiatry     Depression:  Continue with current medications  No changes  Keep f/u with  psychiatry    Palpitations:  Continue with current medication  Stable at this time    HTN:  Stable at this time  Continue with current medication    Impacted cerumen:  B/l ear lavage  bilateral ear was flushed with warm tap water and peroxide mixture per order of [employer], PA-C     Follow up: Return in about 3 months (around 02/27/2022), or if symptoms worsen or fail to improve.  This patient was seen independently.    Steva Readyhelsey Palmer, PA-C  11/27/2021, 13:09    [facility], [facility]  [address]  [place] [id number]    Medicare Annual Wellness Visit    Name: Elizabeth Hunter MRN:  [id number]   Date: 11/27/2021 Age: 81 y.o.       SUBJECTIVE:   Elizabeth Hunter is a 81 y.o. female for presenting for Medicare Wellness exam.   I have reviewed and reconciled the medication list with the patient today.    Comprehensive Health Assessment:  Paper document COMPREHENSIVE HEALTH ASSESSMENT reviewed and scanned into medical record    I have reviewed and updated as appropriate the past medical, family and social history. 11/27/2021 as summarized below:  Past Medical History:   Diagnosis Date    AAA (abdominal aortic aneurysm) (CMS HCC)     Back ache     Bowel trouble     obstruction/nonsurgical    Chronic headaches     Constipation     Depression     Fibromyalgia     Incomplete defecation     Osteoarthritis     Rectocele     [facility]      Past Surgical History:   Procedure Laterality Date    Hx appendectomy      Hx cesarean section      Hx cholecystectomy      Hx colonoscopy      Hx lap cholecystectomy  2012    Hx sinus surgery  2014    Hx wisdom teeth extraction       Current Outpatient Medications   Medication Sig    escitalopram oxalate (LEXAPRO) 20 mg Oral Tablet Take 1 Tablet (20 mg total) by mouth Once a day    meloxicam (MOBIC) 15 mg Oral Tablet TAKE 1 TABLET BY MOUTH EVERY DAY    metoprolol tartrate (LOPRESSOR) 25 mg Oral Tablet Take 0.5 Tablets (12.5 mg  total) by mouth Twice daily    OLANZapine (ZYPREXA) 5 mg Oral Tablet Take 1.5 Tablets (7.5 mg total) by mouth Every night Increase to 5mg  if tolerated well.     Family Medical History:       Problem Relation (Age of Onset)    Heart Attack Father 5(68)    Hypertension (High Blood Pressure) Mother 53(66)            Social History     Socioeconomic History    Marital status: Widowed   Occupational History    Occupation: SELF     Comment: [employer]   Tobacco Use    Smoking status: Never    Smokeless tobacco: Never   Vaping Use    Vaping Use: Never used   Substance and Sexual Activity    Alcohol use: No    Drug use: Yes     Types: Benzodiazepines    Sexual activity: Not Currently     Comment: did not ask         List of Current Health Care Providers   Care Team       PCP       Name Type Specialty Phone Number    Cecilie KicksMidcap, [place], DO Physician [facility] [phone]              Care Team       No care team found  Health Maintenance   Topic Date Due    Influenza Vaccine (1) 10/02/2021    Medicare Annual Wellness Visit  11/28/2022    Meningococcal Vaccine  Aged Out    Adult Tdap-Td  Discontinued    Osteoporosis screening  Discontinued    Shingles Vaccine  Discontinued    Covid-19 Vaccine  Discontinued    Pneumococcal Vaccination, Age 11+  Discontinued     Medicare Wellness Assessment      Advance Directives                  Activities of Daily Living   Do you need help with dressing, bathing, or walking?: No   Do you need help with shopping, housekeeping, medications, or finances?: No   Do you have rugs in hallways, broken steps, or poor lighting?: No   Do you have grab bars in your bathroom, non-slip strips in your tub, and hand rails on your stairs?: Yes   Urinary Incontinence Screen   Do you ever leak urine when you don't want to?: No   Cognitive Function Screen (1=Yes, 0=No)   What is you age?: Correct   What is the time to the nearest hour?: Correct   What is the year?: Correct   What is the  name of this clinic?: Correct   Can the patient recognize two persons (the doctor, the nurse, home help, etc.)?: Correct   What is the date of your birth? (day and month sufficient) : Correct   In what year did World War II end?: Correct   Who is the current president of the [place]?: Correct   Count from 20 down to 1?: Correct   What address did I give you earlier?: Correct   Total Score: 10       Hearing Screen   Have you noticed any hearing difficulties?: Yes  After whispering 9-1-6 how many numbers did the patient repeat correctly?: 3   Fall Risk Screen   Do you feel unsteady when standing or walking?: No  Do you worry about falling?: No  Have you fallen in the past year?: No   Vision Screen   Right Eye = 20: 0.67   Left Eye = 20: 0.5   Depression Screen     Little interest or pleasure in doing things.: Several Days  Feeling down, depressed, or hopeless: Several Days  PHQ 2 Total: 2     Pain Score   Pain Score:   0 - No pain    Substance Use-Abuse Screening     Tobacco Use     In Past 12 MONTHS, how often have you used any tobacco product (for example, cigarettes, e-cigarettes, cigars, pipes, or smokeless tobacco)?: Never     Alcohol use     In the PAST 12 MONTHS, how often have you had 5 (men)/4 (women) or more drinks containing alcohol in one day?: Never     Prescription Drug Use     In the PAST 12 months, how often have you used any prescription medications just for the feeling, more than prescribed, or that were not prescribed for you? Prescriptions may include: opioids, benzodiazepines, medications for ADHD: Never           Illicit Drug Use              OBJECTIVE:   BP 122/70   Pulse 66   Temp 36.6 C (97.9 F)   Ht 1.549 m (5\' 1" )   Wt 66.7 kg (  147 lb)   SpO2 97%   BMI 27.78 kg/m        Other appropriate exam:    Health Maintenance Due   Topic Date Due    Influenza Vaccine (1) 10/02/2021      ASSESSMENT & PLAN:  Problem List Items Addressed This Visit          Psychiatric    Generalized  anxiety disorder with panic attacks (Chronic)    Moderate major depression (CMS HCC)     Other Visit Diagnoses       Encounter for Medicare annual wellness exam    -  Primary    Need for vaccination        Relevant Orders    Flu Vaccine, 65+,0.5 mL IM (Admin)    Grief        Palpitations        Primary hypertension        Bilateral impacted cerumen                 Identified Risk Factors/ Recommended Actions       Orders Placed This Encounter    Flu Vaccine, 65+,0.5 mL IM (Admin)          The patient has been educated about risk factors and recommended preventive care. Written Prevention Plan completed/ updated and given to patient (see After Visit Summary).    Orders Placed This Encounter    Flu Vaccine, 65+,0.5 mL IM (Admin)      Return in about 3 months (around 02/27/2022), or if symptoms worsen or fail to improve.    Steva Ready, PA-C

## 2021-11-27 NOTE — Patient Instructions (Signed)
Medicare Preventive Services  Medicare coverage information Recommendation for YOU   Heart Disease and Diabetes   Lipid profile Every 5 years or more often if at risk for cardiovascular disease  Last Lipid Panel  (Last result in the past 2 years)        Cholesterol   HDL   LDL   Direct LDL   Triglycerides      02/19/21 1332 214   43   159  Comment: <100 mg/dL, Optimal  100-129 mg/dL, Near/Above Optimal  130-159 mg/dL, Borderline High  160-189 mg/dL, High  >=190 mg/dL, Very high     58             Diabetes Screening  yearly for those at risk for diabetes, 2 tests per year for those with prediabetes Last Glucose: 87    Diabetes Self Management Training or Medical Nutrition Therapy  For those with diabetes, up to 10 hrs initial training within a year, subsequent years up to 2 hrs of follow up training Optional for those with diabetes     Medical Nutrition Therapy Three hours of one-on-one counseling in first year, two hours in subsequent years Optional for those with diabetes, kidney disease   Intensive Behavioral Therapy for Obesity  Face-to-face counseling, first month every week, month 2-6 every other week, month 7-12 every month if continued progress is documented Optional for those with Body Mass Index 30 or higher  Your Body mass index is 27.78 kg/m.   Tobacco Cessation (Quitting) Counseling   Two attempts per year, max 4 sessions per attempt, up to 8 per year, for those with tobacco-related health condition Optional for those that use tobacco   Cancer Screening   Colorectal screening   For anyone age 73 to 77 or any age if high risk:  Screening Colonoscopy every 10 yrs if low risk,  more frequent if higher risk  OR  Cologuard Stool DNA test once every 3 years OR  Fecal Occult Blood Testing yearly OR  Flexible  Sigmoidoscopy  every 5 yr OR  CT Colonography every 5 yrs    See your schedule below   Screening Pap Test Recommended every 3 years for all women age 46 to 5, or every five years if combined with HPV  test (routine screening not needed after total hysterectomy).  Medicare covers every 2 years, up to yearly if high risk.  Screening Pelvic Exam Medicare covers every 2 years, yearly if high risk or childbearing age with abnormal Pap in last 3 yrs. See your schedule below   Screening Mammogram   Recommended every 2 years for women age 59 to 36, or more frequent if you have a higher risk. Selectively recommended for women between 40-49 based on shared decisions about risk. Covered by Medicare up to every year for women age 58 or older See your schedule below      Lung Cancer Screening  Annual low dose computed tomography (LDCT scan) is recommended for those age 73-77 who smoked 20 pack-years and are current smokers or quit smoking within past 15 years (one pack-year= smoking one PPD for one year), after counseling by your doctor or nurse clinician about the possible benefits or harms. See your schedule below   Vaccinations   Pneumococcal Vaccine: Recommended routinely age 60+ with one or two separate vaccines based on your risk    Recommended before age 71 if medical conditions with increased risk  Seasonal Influenza Vaccine: Once every flu season   Hepatitis  B Vaccine: 3 doses if risk (including anyone with diabetes or liver disease)  Shingles Vaccine: Two doses at age 31 or older  Diphtheria Tetanus Pertussis Vaccine: ONCE as adult, booster every 10 years     Immunization History   Administered Date(s) Administered    Covid-19 Vaccine,Pfizer-BioNTech,Purple Top,43yrs+ 03/15/2019, 04/05/2019    Flucelvax Influenza Vaccine, 6 months + 12/16/2019    High-Dose Influenza Vaccine, 65+ 12/06/2017    Influenza Vaccine, 6 month-adult 10/31/2013    Influenza Vaccine, 65+ 11/15/2018, 11/13/2020     Shingles vaccine and Diphtheria Tetanus Pertussis vaccines are available at pharmacies or local health department without a prescription.   Other Screening   Bone Densitometry   Every 24 months for anyone at risk, including  postmenopausal       Glaucoma Screening   Yearly if in high risk group such as diabetes, family history, African American age 29+ or Hispanic American age 31+   See your Eye Care Provider   Hepatitis C Screening recommended ONCE for those born between 1945-1965, or high risk for HCV infection       HIV Testing recommended routinely at least ONCE, covered every year for age 4 to 50 regardless of risk, and every year for age over 67 who ask for the test or higher risk  Yearly or up to 3 times in pregnancy         Abdominal Aortic Aneurysm Screening Ultrasound   Once between the age of 54-75 with a family history of AAA       Your Personalized Schedule for Preventive Tests     Health Maintenance: Pending and Last Completed         Date Due Completion Date    Influenza Vaccine (1) 10/02/2021 11/13/2020    Medicare Annual Wellness Visit 10/22/2021 10/22/2020    Override on 10/22/2020: Gardiner Rhyme

## 2021-11-27 NOTE — Progress Notes (Signed)
Immunization administered       Name Date Dose VIS Date Route    Influenza Vaccine, 65+ 11/27/2021 0.5 mL 09/07/2019 Intramuscular    Site: Left deltoid    Given By: Loistine Chance, MA    Manufacturer: [employer]    Lot: [id number]    [place]: [phone]

## 2022-03-08 ENCOUNTER — Other Ambulatory Visit (INDEPENDENT_AMBULATORY_CARE_PROVIDER_SITE_OTHER): Payer: Self-pay | Admitting: Family Medicine

## 2022-03-08 DIAGNOSIS — R002 Palpitations: Secondary | ICD-10-CM

## 2022-03-08 DIAGNOSIS — R000 Tachycardia, unspecified: Secondary | ICD-10-CM

## 2022-03-08 DIAGNOSIS — I10 Essential (primary) hypertension: Secondary | ICD-10-CM

## 2022-03-08 MED ORDER — METOPROLOL TARTRATE 25 MG TABLET
12.5000 mg | ORAL_TABLET | Freq: Two times a day (BID) | ORAL | 3 refills | Status: DC
Start: 2022-03-08 — End: 2022-05-24

## 2022-03-30 ENCOUNTER — Other Ambulatory Visit (INDEPENDENT_AMBULATORY_CARE_PROVIDER_SITE_OTHER): Payer: Self-pay

## 2022-03-30 ENCOUNTER — Encounter (INDEPENDENT_AMBULATORY_CARE_PROVIDER_SITE_OTHER): Payer: Self-pay | Admitting: VASCULAR SURGERY

## 2022-04-20 ENCOUNTER — Other Ambulatory Visit: Payer: Self-pay

## 2022-04-20 ENCOUNTER — Encounter (INDEPENDENT_AMBULATORY_CARE_PROVIDER_SITE_OTHER): Payer: Self-pay | Admitting: VASCULAR SURGERY

## 2022-04-20 ENCOUNTER — Inpatient Hospital Stay (INDEPENDENT_AMBULATORY_CARE_PROVIDER_SITE_OTHER)
Admission: RE | Admit: 2022-04-20 | Discharge: 2022-04-20 | Disposition: A | Discharge status: Home / Self Care | Payer: HMO | Source: Ambulatory Visit

## 2022-04-20 ENCOUNTER — Ambulatory Visit (INDEPENDENT_AMBULATORY_CARE_PROVIDER_SITE_OTHER): Payer: HMO | Admitting: VASCULAR SURGERY

## 2022-04-20 VITALS — BP 126/67 | HR 67 | Ht 61.0 in | Wt 152.0 lb

## 2022-04-20 DIAGNOSIS — I779 Disorder of arteries and arterioles, unspecified: Secondary | ICD-10-CM

## 2022-04-20 MED ORDER — ASPIRIN 81 MG TABLET,DELAYED RELEASE
81.0000 mg | DELAYED_RELEASE_TABLET | Freq: Every day | ORAL | Status: DC
Start: 2022-04-20 — End: 2023-06-07

## 2022-04-20 NOTE — Progress Notes (Signed)
Department of Vascular Surgery  [facility]  Outpatient Clinic Progress Note    Date: 04/20/2022  Patient: Elizabeth Hunter  DOB:   PCP: Londell Moh, DO    Chief Complaint:  Mild carotid disease, mild left subclavian stenosis  Chief Complaint   Patient presents with    Follow Up     1 year  Bilat carotid artery disease   Aaa  Splenic artery aneurysm  Duplex 04/20/22       Subjective:     HPI: Elizabeth Hunter is a 82 y.o. White female who presents for follow-up of mild carotid disease.    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  It was stable for several years and it was no longer monitored.      She denies neurologic complaints consistent with stroke or TIA.  She denies amaurosis, unilateral weakness or expressive aphasia.      She was not currently take antiplatelet therapy as she was on Mobic for arthritis.  Her last cholesterol reading from January 2023 revealed a total cholesterol 214 with an LDL of 159 and HDL 43, triglycerides 58.  She was not currently on a statin agent.  She was to have a follow up with the primary care provider in the near future in which she will have repeat lipid panel performed.      The patient denies any new changes in PMHx, PSxH, Social Hx, FHx or current medications.     Review of Systems:  Constitutional: negative for fevers, chills, sweats and fatigue  Eyes: negative for blurred vision or field defects  HEENT: negative for hearing loss  Respiratory: negative for cough and shortness of breath  Cardiovascular: negative for chest pain, negative for claudication  Gastrointestinal: negative for post prandial pain  Genitourinary: negative for dysuria, continues to urinate  Musculoskeletal: As in HPI  Neurological: negative for unilateral weakness or monocular blindness  Integumentary: negative for wounds or ulcers    Objective:    PHYSICAL EXAM:  Vitals: BP  126/67 (Site: Right, Patient Position: Sitting)   Pulse 67   Ht 1.549 m (5\' 1" )   Wt 68.9 kg (152 lb)   SpO2 95%   BMI 28.72 kg/m       General: AA&O X3. Nontoxic.  Well developed and well nourished in no acute distress   HENT: Head is normocephalic, atraumatic   Eyes: Pupils equal and round and reactive to light; conjunctiva clear. Sclera without icterus; EOMO grossly intact.    Neck: Normal ROM, Supple, symmetrical  Lungs: Effort normal, clear to auscultation bilaterally.   Cardiovascular: Heart regular rate and rhythm, S1, S2 normal, no murmur, click, rub or gallop  Vascular:  no carotid bruits   Left brachial artery:  2+ (normal),  Right brachial artery:  2+ (normal),    Left radial artery:  2+ (normal),  Right radial artery:  2+ (normal),     Abdomen: Bowel sounds normal; soft, non distended non-tender to palpation, no rebound or guarding present. No palpable masses.  Extremities: no cyanosis or edema  Skin:  Skin warm and dry      DATA:     I have independently reviewed and interpreted available imaging.  My interpretations are summarized below.      DIAGNOSTIC STUDIES REVIEWED:  Carotid duplex 04/20/2022  Bilateral: 1. Right ICA: Less than 50% stenosis - Mild  2. Right  ECA: Less than 50% diameter reduction  3. Right vertebral: Normal, Antegrade  4. Left ICA: 0% stenosis -Normal  5. Left ECA: Normal  6. 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    Given her calcified splenic artery aneurysm has been stable for many years, she does not require further follow-up in this regard.    We will monitor her carotid artery disease with an annual carotid duplex to ensure she was not having progression.    If any neurologic symptoms arise in the interim she will follow-up sooner.     I have asked her to start taking an aspirin 81 mg enteric coated in the evening if she was able to tolerate this with her meloxicam.    I will leave statin agent therapy to her primary care provider she was to have upcoming lipid panel performed.    The patient was given the opportunity to ask questions and those questions were answered to their satisfaction. Instructed to call with any further questions or concerns.     This patient was seen independently. The case and care was discussed with Dr. Donnie Mesa.    I independently of the faculty provider spent a total of 15 minutes in direct/indirect care of this patient including initial evaluation, review of laboratory, radiology, diagnostic studies, review of medical record, order entry and coordination of care.    Christene Lye, PA-C    I have discussed the case with Christene Lye and have reviewed her note.  I concur with the plans.    Ayesha Rumpf, MD

## 2022-04-21 ENCOUNTER — Ambulatory Visit: Payer: HMO | Attending: Foot & Ankle Surgery | Admitting: Foot & Ankle Surgery

## 2022-04-21 ENCOUNTER — Encounter (INDEPENDENT_AMBULATORY_CARE_PROVIDER_SITE_OTHER): Payer: Self-pay | Admitting: Foot & Ankle Surgery

## 2022-04-21 DIAGNOSIS — L03039 Cellulitis of unspecified toe: Secondary | ICD-10-CM | POA: Insufficient documentation

## 2022-04-21 DIAGNOSIS — L600 Ingrowing nail: Secondary | ICD-10-CM | POA: Insufficient documentation

## 2022-04-21 MED ORDER — LIDOCAINE HCL 20 MG/ML (2 %) INJECTION SOLUTION
6.0000 mL | Freq: Once | INTRAMUSCULAR | Status: AC
Start: 2022-04-21 — End: 2022-04-21
  Administered 2022-04-21: 120 mg via INTRADERMAL

## 2022-04-21 NOTE — Progress Notes (Signed)
WOUND CARE, [address]  [place] [address]  Operated by [facility]          Name: Elizabeth Hunter MRN:  [id number]   Date: 04/21/2022 Age: 82 y.o.         Chief Complaint:   Chief Complaint   Patient presents with    Ingrown Toenail     Patient is here for bilateral ingrown toenails today on the Greater Toes.        Subjective  Elizabeth Hunter is a 82 y.o. female who presents to clinic with complaint of painful ingrown toenails both great toes both nail borders.  Pain is so severe she can barely touch the toes.  No current treatment      Objective    On physical examination Elizabeth Hunter is seated comfortably in the examination room in no apparent distress.  Alert and oriented to time and place. Mood and affect are normal and appropriate to situation.  She  appears well developed, well nourished, with good attention to hygiene and body habitus.    Skin:  Bilateral hallux bilateral nail border incurvated with localized edema and faint erythema.  No drainage.  Pain with dorsal pressure of the nail  Musculoskeletal:  5 5 muscle strength for all groups tested  Neurological:  Gross sensation intact bilateral foot  Vascular:  Pedal pulses are intact bilateral.  CFT is immediate to all toes.      Assessment/ Plan  Elizabeth Hunter was seen today for ingrown toenail.    Diagnoses and all orders for this visit:    Ingrown nail  -     11730 - AVULSION OF NAIL PLATE, PARTIAL/COMPLETE, SIMPLE; SINGLE (AMB ONLY)    Paronychia of toe, unspecified laterality  -     11730 - AVULSION OF NAIL PLATE, PARTIAL/COMPLETE, SIMPLE; SINGLE (AMB ONLY)    Other orders  -     lidocaine 2% injection        1. I discussed treatment options for the patients ingrown toenail.  Due to the level of infection that is present I recommend a partial, temporary avulsion.  I discussed the procedure, risks, and anticipated recovery course.  Anesthesia, 3 cc's 1% lidocaine plain.  Betadine prep.  A partial nail avulsion performed using  an English anvil, a freer elevator, and a straight hemostat bilateral hallux lateral nail border.  Bacitracin ointment and a dressing applied.  Post procedure instructions discussed.  The patient is advised to call for follow-up appointment if pain, redness, drainage, swelling fail to resolve over the course of the next week.  The patient verbalized understanding.     Margit Hanks, DPM        This note was partially created using M*Modal fluency direct system (voice recognition software ) and is inherently subject to errors including those of syntax and "sound- alike" substitutions which may escape proofreading.  In such instances, , original meaning may be extrapolated by contextual derivation.

## 2022-04-21 NOTE — Procedures (Signed)
WOUND CARE, [address]  [place] [address]  Operated by [facility]  Procedure Note    Name: Elizabeth Hunter MRN:  [id number]   Date: 04/21/2022 DOB:   (82 y.o.)         11730 - AVULSION OF NAIL PLATE, PARTIAL/COMPLETE, SIMPLE; SINGLE (AMB ONLY)    Performed by: Margit Hanks, DPM  Authorized by: Margit Hanks, DPM    Documentation:      I discussed treatment options for the patients ingrown toenail.  Due to the level of infection that is present I recommend a partial, temporary avulsion.  I discussed the procedure, risks, and anticipated recovery course.  Anesthesia, 3 cc's 1% lidocaine plain.  Betadine prep.  A partial nail avulsion performed using an English anvil, a freer elevator, and a straight hemostat bilateral hallux bilateral nail border.  Bacitracin ointment and a dressing applied.  Post procedure instructions discussed.  The patient is advised to call for follow-up appointment if pain, redness, drainage, swelling fail to resolve over the course of the next week.  The patient verbalized understanding.       Margit Hanks, DPM

## 2022-05-24 ENCOUNTER — Other Ambulatory Visit (INDEPENDENT_AMBULATORY_CARE_PROVIDER_SITE_OTHER): Payer: Self-pay | Admitting: Family Medicine

## 2022-05-24 DIAGNOSIS — R000 Tachycardia, unspecified: Secondary | ICD-10-CM

## 2022-05-24 DIAGNOSIS — I10 Essential (primary) hypertension: Secondary | ICD-10-CM

## 2022-05-24 DIAGNOSIS — R002 Palpitations: Secondary | ICD-10-CM

## 2022-05-24 MED ORDER — METOPROLOL TARTRATE 25 MG TABLET
12.5000 mg | ORAL_TABLET | Freq: Two times a day (BID) | ORAL | 3 refills | Status: DC
Start: 2022-05-24 — End: 2023-05-15

## 2022-06-10 ENCOUNTER — Other Ambulatory Visit (INDEPENDENT_AMBULATORY_CARE_PROVIDER_SITE_OTHER): Payer: Self-pay | Admitting: Physician Assistant

## 2022-06-10 DIAGNOSIS — M5136 Other intervertebral disc degeneration, lumbar region: Secondary | ICD-10-CM

## 2022-10-26 ENCOUNTER — Ambulatory Visit: Payer: HMO | Attending: Family Medicine | Admitting: Family Medicine

## 2022-10-26 ENCOUNTER — Other Ambulatory Visit: Payer: Self-pay

## 2022-10-26 ENCOUNTER — Encounter (INDEPENDENT_AMBULATORY_CARE_PROVIDER_SITE_OTHER): Payer: Self-pay | Admitting: Family Medicine

## 2022-10-26 VITALS — BP 110/70 | HR 69 | Temp 98.0°F | Resp 12 | Ht 61.0 in | Wt 142.8 lb

## 2022-10-26 DIAGNOSIS — Z13820 Encounter for screening for osteoporosis: Secondary | ICD-10-CM | POA: Insufficient documentation

## 2022-10-26 DIAGNOSIS — H6123 Impacted cerumen, bilateral: Secondary | ICD-10-CM | POA: Insufficient documentation

## 2022-10-26 DIAGNOSIS — L989 Disorder of the skin and subcutaneous tissue, unspecified: Secondary | ICD-10-CM | POA: Insufficient documentation

## 2022-10-26 DIAGNOSIS — Z780 Asymptomatic menopausal state: Secondary | ICD-10-CM | POA: Insufficient documentation

## 2022-10-26 NOTE — Progress Notes (Signed)
FAMILY MEDICINE, [facility]  [address]  [place] [address]  Operated by [facility]     Name: Elizabeth Hunter  MRN:  [id number]       Date: 10/26/2022  DOB:      Age: 82 y.o.         Reason for Visit: Follow Up (Wants referral to Dermatology/Dr Foti spot on her face.)    History of Present Illness  Elizabeth Hunter is a 82 y.o. female who is being seen today for referral skin lesion right side of face.  Needs wax cleaned out of years bilateral      Past Medical History:   Diagnosis Date    AAA (abdominal aortic aneurysm) (CMS HCC)     Back ache     Bowel trouble     obstruction/nonsurgical    Chronic headaches     Constipation     Depression     Fibromyalgia     Incomplete defecation     Osteoarthritis     Rectocele     Splenic artery aneurysm (CMS HCC)          Past Surgical History:   Procedure Laterality Date    HX APPENDECTOMY      HX CESAREAN SECTION      2    HX CHOLECYSTECTOMY      HX COLONOSCOPY      HX LAP CHOLECYSTECTOMY  2012    HX SINUS SURGERY  2014    HX WISDOM TEETH EXTRACTION           Current Outpatient Medications   Medication Sig    aspirin (ECOTRIN) 81 mg Oral Tablet, Delayed Release (E.C.) Take 1 Tablet (81 mg total) by mouth Once a day (Patient not taking: Reported on 10/26/2022)    escitalopram oxalate (LEXAPRO) 20 mg Oral Tablet Take 1 Tablet (20 mg total) by mouth Once a day    meloxicam (MOBIC) 15 mg Oral Tablet TAKE 1 TABLET DAILY    metoprolol tartrate (LOPRESSOR) 25 mg Oral Tablet Take 0.5 Tablets (12.5 mg total) by mouth Twice daily    OLANZapine (ZYPREXA) 5 mg Oral Tablet Take 1.5 Tablets (7.5 mg total) by mouth Every night Increase to 5mg  if tolerated well.     No Known Allergies  Family Medical History:       Problem Relation (Age of Onset)    Heart Attack Father (54)    Hypertension (High Blood Pressure) Mother (6)            Social History     Tobacco Use    Smoking status: Never    Smokeless tobacco: Never   Vaping Use    Vaping  status: Never Used   Substance Use Topics    Alcohol use: No    Drug use: Not Currently     Types: Benzodiazepines       Nursing note  There are no exam notes on file for this visit.     Review of Systems  Review of Systems   Constitutional: Negative.    HENT:  Positive for hearing loss.    Eyes: Negative.    Respiratory: Negative.     Cardiovascular: Negative.    Gastrointestinal: Negative.    Genitourinary: Negative.    Musculoskeletal: Negative.    Skin:         Skin lesion right side face   Neurological: Negative.    Endo/Heme/Allergies: Negative.    Psychiatric/Behavioral: Negative.  Physical Exam:  BP 110/70 (Site: Right Arm, Patient Position: Sitting, Cuff Size: Adult)   Pulse 69   Temp 36.7 C (98 F)   Resp 12   Ht 1.549 m (5\' 1" )   Wt 64.8 kg (142 lb 12.8 oz)   SpO2 98%   BMI 26.98 kg/m       Physical Exam  HENT:      Head: Normocephalic.      Right Ear: There is impacted cerumen.      Left Ear: There is impacted cerumen.   Eyes:      Conjunctiva/sclera: Conjunctivae normal.      Pupils: Pupils are equal, round, and reactive to light.   Cardiovascular:      Rate and Rhythm: Normal rate and regular rhythm.      Heart sounds: Normal heart sounds.   Pulmonary:      Effort: Pulmonary effort is normal.      Breath sounds: Normal breath sounds.   Abdominal:      General: Bowel sounds are normal.      Palpations: Abdomen is soft.   Musculoskeletal:         General: Normal range of motion.      Cervical back: Normal range of motion and neck supple.   Skin:     General: Skin is warm.      Comments: Skin lesion right side of face but cm in diameter   Neurological:      Mental Status: She is alert and oriented to person, place, and time.      Gait: Gait is intact.   Psychiatric:         Mood and Affect: Mood and affect normal.         Cognition and Memory: Memory normal.         Judgment: Judgment normal.         Assessment and Plan    ENCOUNTER DIAGNOSES     ICD-10-CM   1. Skin lesion of face  L98.9    2. Screening for osteoporosis  Z13.820   3. Postmenopausal status  Z78.0   4. Cerumen debris on tympanic membrane of both ears  H61.23    Removal of bilateral cerumen ears refer patient to dermatologist continue other medicines continue stay active.bilateral ear was flushed with warm tap water and peroxide mixture per order of Dr.  Cecilie Kicks Tara Rud, DO     No orders of the defined types were placed in this encounter.       Follow up: Return in about 6 months (around 04/25/2023).    Cecilie Kicks, DO  10/26/2022, 14:56

## 2022-10-27 ENCOUNTER — Telehealth (INDEPENDENT_AMBULATORY_CARE_PROVIDER_SITE_OTHER): Payer: Self-pay | Admitting: Family Medicine

## 2022-10-27 NOTE — Telephone Encounter (Signed)
10/27/22 pc spoke with Jadamarie gave appt date and time for [facility] 11/01/22 at 9:15 am.edie advise she does not do morning appts andwill call [facility] to rescheduledhilderbrand

## 2022-10-27 NOTE — Addendum Note (Signed)
Addended by: Theda Sers on: 10/27/2022 12:50 PM     Modules accepted: Level of Service

## 2022-12-24 ENCOUNTER — Other Ambulatory Visit: Payer: Self-pay

## 2022-12-24 ENCOUNTER — Inpatient Hospital Stay
Admission: RE | Admit: 2022-12-24 | Discharge: 2022-12-24 | Disposition: A | Discharge status: Home / Self Care | Payer: HMO | Source: Ambulatory Visit | Attending: Family Medicine

## 2022-12-24 DIAGNOSIS — Z780 Asymptomatic menopausal state: Secondary | ICD-10-CM | POA: Insufficient documentation

## 2022-12-24 DIAGNOSIS — Z13820 Encounter for screening for osteoporosis: Secondary | ICD-10-CM | POA: Insufficient documentation

## 2022-12-24 DIAGNOSIS — L989 Disorder of the skin and subcutaneous tissue, unspecified: Secondary | ICD-10-CM | POA: Insufficient documentation

## 2022-12-24 NOTE — Result Encounter Note (Signed)
Left a message for the patient. 12/24/22 tjm

## 2023-02-21 ENCOUNTER — Other Ambulatory Visit: Payer: Self-pay

## 2023-02-21 ENCOUNTER — Ambulatory Visit: Payer: HMO | Attending: Foot & Ankle Surgery | Admitting: Foot & Ankle Surgery

## 2023-02-21 ENCOUNTER — Encounter (INDEPENDENT_AMBULATORY_CARE_PROVIDER_SITE_OTHER): Payer: Self-pay | Admitting: Foot & Ankle Surgery

## 2023-02-21 ENCOUNTER — Encounter (INDEPENDENT_AMBULATORY_CARE_PROVIDER_SITE_OTHER): Payer: Self-pay

## 2023-02-21 DIAGNOSIS — L600 Ingrowing nail: Secondary | ICD-10-CM | POA: Insufficient documentation

## 2023-02-21 DIAGNOSIS — L03032 Cellulitis of left toe: Secondary | ICD-10-CM | POA: Insufficient documentation

## 2023-02-21 MED ORDER — LIDOCAINE HCL 20 MG/ML (2 %) INJECTION SOLUTION
3.0000 mL | Freq: Once | INTRAMUSCULAR | Status: AC
Start: 2023-02-21 — End: 2023-02-21
  Administered 2023-02-21: 60 mg via INTRADERMAL

## 2023-02-21 NOTE — Procedures (Signed)
Elizabeth Hunter, [facility] [address]   [place] [address]  Operated by [facility]  Procedure Note    Name: Elizabeth Hunter MRN:  [id number]   Date: 02/21/2023 DOB:  1940/08/29 (83 y.o.)         11730 - AVULSION OF NAIL PLATE, PARTIAL/COMPLETE, SIMPLE; SINGLE (AMB ONLY)    Performed by: Wyn Quaker, DPM  Authorized by: Wyn Quaker, DPM    Documentation:      I discussed treatment options for the patients ingrown toenail.  Due to the level of infection that is present I recommend a partial, temporary avulsion.  I discussed the procedure, risks, and anticipated recovery course.  Anesthesia, 3 cc's 1% lidocaine plain.  Betadine prep.  A partial nail avulsion performed using an English anvil, a freer elevator, and a straight hemostat left hallux medial nail border.  Bacitracin ointment and a dressing applied.  Post procedure instructions discussed.  The patient is advised to call for follow-up appointment if pain, redness, drainage, swelling fail to resolve over the course of the next week.  The patient verbalized understanding.       Wyn Quaker, DPM

## 2023-02-21 NOTE — Progress Notes (Signed)
Elizabeth Hunter, [facility] [address]  [place] [address]  Operated by [facility]          Name: Jamilett Freehill MRN:  [id number]   Date: 02/21/2023 Age: 83 y.o.         Chief Complaint:   Chief Complaint   Patient presents with    Ingrown Toenail     Pt is here for an ingrown toenail on the L greater toe.        Subjective  Amiria Margheim is a 83 y.o. female who presents to clinic for follow-up pain redness swelling left great toe.  She points along the medial nail border as the source of the pain      Objective    On physical examination Bethel Kinoshita is seated comfortably in the examination room in no apparent distress.  Alert and oriented to time and place. Mood and affect are normal and appropriate to situation.  She  appears well developed, well nourished, with good attention to hygiene and body habitus.    Skin:  Left hallux medial border incurvated localized edema faint erythema no drainage.  Pain with dorsal pressure of the nail  Musculoskeletal:  5 5 muscle strength for all groups tested  Neurological:  Gross sensation intact  Vascular:  DP and PT pulses are palpable 2/4 left.  CFT is immediate to all toes      Assessment/ Plan  Kendal Hymen was seen today for ingrown toenail.    Diagnoses and all orders for this visit:    Ingrown nail  -     11730 - AVULSION OF NAIL PLATE, PARTIAL/COMPLETE, SIMPLE; SINGLE (AMB ONLY)    Paronychia of toe of left foot  -     11730 - AVULSION OF NAIL PLATE, PARTIAL/COMPLETE, SIMPLE; SINGLE (AMB ONLY)    Other orders  -     lidocaine 2% injection        1. I discussed treatment options for the patients ingrown toenail.  Due to the level of infection that is present I recommend a partial, temporary avulsion.  I discussed the procedure, risks, and anticipated recovery course.  Anesthesia, 3 cc's 1% lidocaine plain.  Betadine prep.  A partial nail avulsion performed using an English anvil, a freer elevator, and a straight hemostat left hallux medial nail border.   Bacitracin ointment and a dressing applied.  Post procedure instructions discussed.  The patient is advised to call for follow-up appointment if pain, redness, drainage, swelling fail to resolve over the course of the next week.  The patient verbalized understanding.     Wyn Quaker, DPM        This note was partially created using M*Modal fluency direct system (voice recognition software ) and is inherently subject to errors including those of syntax and "sound- alike" substitutions which may escape proofreading.  In such instances, , original meaning may be extrapolated by contextual derivation.

## 2023-04-26 ENCOUNTER — Ambulatory Visit (INDEPENDENT_AMBULATORY_CARE_PROVIDER_SITE_OTHER): Payer: Self-pay | Admitting: PHYSICIAN ASSISTANT

## 2023-04-26 ENCOUNTER — Ambulatory Visit (INDEPENDENT_AMBULATORY_CARE_PROVIDER_SITE_OTHER): Payer: Self-pay

## 2023-05-15 ENCOUNTER — Other Ambulatory Visit (INDEPENDENT_AMBULATORY_CARE_PROVIDER_SITE_OTHER): Payer: Self-pay | Admitting: Family Medicine

## 2023-05-15 DIAGNOSIS — R002 Palpitations: Secondary | ICD-10-CM

## 2023-05-15 DIAGNOSIS — R000 Tachycardia, unspecified: Secondary | ICD-10-CM

## 2023-05-15 DIAGNOSIS — I10 Essential (primary) hypertension: Secondary | ICD-10-CM

## 2023-06-06 ENCOUNTER — Other Ambulatory Visit (INDEPENDENT_AMBULATORY_CARE_PROVIDER_SITE_OTHER): Payer: Self-pay | Admitting: Family Medicine

## 2023-06-06 DIAGNOSIS — M51369 Other intervertebral disc degeneration, lumbar region without mention of lumbar back pain or lower extremity pain: Secondary | ICD-10-CM

## 2023-06-07 ENCOUNTER — Other Ambulatory Visit: Payer: Self-pay

## 2023-06-07 ENCOUNTER — Ambulatory Visit: Payer: Self-pay | Attending: Physician Assistant | Admitting: Physician Assistant

## 2023-06-07 ENCOUNTER — Encounter (INDEPENDENT_AMBULATORY_CARE_PROVIDER_SITE_OTHER): Payer: Self-pay | Admitting: Physician Assistant

## 2023-06-07 VITALS — BP 108/70 | HR 80 | Temp 97.5°F | Resp 12 | Ht 61.0 in | Wt 150.4 lb

## 2023-06-07 DIAGNOSIS — M79671 Pain in right foot: Secondary | ICD-10-CM | POA: Insufficient documentation

## 2023-06-07 DIAGNOSIS — M79672 Pain in left foot: Secondary | ICD-10-CM | POA: Insufficient documentation

## 2023-06-07 DIAGNOSIS — Z0000 Encounter for general adult medical examination without abnormal findings: Secondary | ICD-10-CM | POA: Insufficient documentation

## 2023-06-07 DIAGNOSIS — F321 Major depressive disorder, single episode, moderate: Secondary | ICD-10-CM | POA: Insufficient documentation

## 2023-06-07 DIAGNOSIS — M1990 Unspecified osteoarthritis, unspecified site: Secondary | ICD-10-CM | POA: Insufficient documentation

## 2023-06-07 DIAGNOSIS — I10 Essential (primary) hypertension: Secondary | ICD-10-CM | POA: Insufficient documentation

## 2023-06-07 NOTE — Progress Notes (Signed)
 FAMILY MEDICINE, [facility]  [address]  [place] [address]  Operated by [facility]     Name: Elizabeth Hunter  MRN:  [id number]       Date: 06/07/2023  DOB:    October 08, 1940  Age: 83 y.o.             Reason for Visit: Medicare Annual    History of Present Illness  Elizabeth Hunter is a 83 y.o. female who is being seen today for medicare wellness visit. Pt also c/o foot pain. Pt states she will have foot pain when she wakes up. States it hurts even with laying in bed. States once she gets up and gets moving her pain improves. Pt denies pain at this time but states pain is about 7 out of 10 when present.       Patient rates their pain a 7 out of 10 on the pain scale.    Past Medical History:   Diagnosis Date    AAA (abdominal aortic aneurysm)     Back ache     Bowel trouble     obstruction/nonsurgical    Chronic headaches     Constipation     Depression     Fibromyalgia     Incomplete defecation     Osteoarthritis     Rectocele     Splenic artery aneurysm (CMS HCC)          Past Surgical History:   Procedure Laterality Date    HX APPENDECTOMY      HX CESAREAN SECTION      2    HX CHOLECYSTECTOMY      HX COLONOSCOPY      HX LAP CHOLECYSTECTOMY  2012    HX SINUS SURGERY  2014    HX WISDOM TEETH EXTRACTION           Current Outpatient Medications   Medication Sig    escitalopram oxalate (LEXAPRO) 20 mg Oral Tablet Take 1 Tablet (20 mg total) by mouth Daily    meloxicam  (MOBIC ) 15 mg Oral Tablet TAKE 1 TABLET DAILY    metoprolol  tartrate (LOPRESSOR ) 25 mg Oral Tablet TAKE 1/2 TABLET(12.5 MG) BY MOUTH TWICE DAILY    OLANZapine (ZYPREXA) 5 mg Oral Tablet Take 1.5 Tablets (7.5 mg total) by mouth Every night Increase to 5mg  if tolerated well.     No Known Allergies  Family Medical History:       Problem Relation (Age of Onset)    Heart Attack Father (86)    Hypertension (High Blood Pressure) Mother (66)            Social History     Tobacco Use    Smoking status: Never    Smokeless tobacco:  Never   Vaping Use    Vaping status: Never Used   Substance Use Topics    Alcohol use: No    Drug use: Not Currently     Types: Benzodiazepines       Nursing Notes  There are no exam notes on file for this visit.     Review of Systems  Review of Systems   Constitutional:  Negative for chills, fever, malaise/fatigue and weight loss.   HENT:  Negative for congestion, ear discharge, ear pain, hearing loss, nosebleeds, sinus pain and sore throat.    Eyes:  Negative for blurred vision, photophobia and discharge.   Respiratory:  Negative for cough, hemoptysis, sputum production, shortness of breath and wheezing.  Cardiovascular:  Negative for chest pain, palpitations and leg swelling.   Gastrointestinal:  Negative for abdominal pain, blood in stool, constipation, diarrhea, heartburn, nausea and vomiting.   Genitourinary:  Negative for dysuria, frequency, hematuria and urgency.   Musculoskeletal:  Positive for joint pain. Negative for back pain, falls, myalgias and neck pain.        B/l foot pain   Skin:  Negative for itching and rash.   Neurological:  Negative for dizziness, tingling, tremors, weakness and headaches.   Endo/Heme/Allergies:  Negative for environmental allergies. Does not bruise/bleed easily.   Psychiatric/Behavioral:  Positive for depression. Negative for memory loss, substance abuse and suicidal ideas. The patient is nervous/anxious. The patient does not have insomnia.         Stable at this time  Pt states she is taking he medication as directed      Pt states her daughter has moved back home with her.        Physical Exam:  BP 108/70 (Site: Left Arm, Patient Position: Sitting, Cuff Size: Adult)   Pulse 80   Temp 36.4 C (97.5 F) (Tympanic)   Resp 12   Ht 1.549 m (5\' 1" )   Wt 68.2 kg (150 lb 6.4 oz)   SpO2 96%   BMI 28.42 kg/m       Physical Exam  Vitals and nursing note reviewed.   Constitutional:       General: She is not in acute distress.     Appearance: Normal appearance. She is not  diaphoretic.   HENT:      Head: Normocephalic and atraumatic.      Right Ear: External ear normal.      Left Ear: External ear normal.      Nose: Nose normal.   Eyes:      Conjunctiva/sclera: Conjunctivae normal.      Pupils: Pupils are equal, round, and reactive to light.   Cardiovascular:      Rate and Rhythm: Normal rate and regular rhythm.      Heart sounds: Normal heart sounds.   Pulmonary:      Effort: Pulmonary effort is normal.      Breath sounds: Normal breath sounds.   Musculoskeletal:         General: Normal range of motion.      Right foot: Normal range of motion. No deformity.      Left foot: Normal range of motion. No deformity.   Skin:     General: Skin is warm and dry.   Neurological:      Mental Status: She is alert and oriented to person, place, and time.      Coordination: Coordination normal.      Gait: Gait is intact.   Psychiatric:         Mood and Affect: Mood and affect normal.         Behavior: Behavior normal.         Thought Content: Thought content normal.         Cognition and Memory: Memory normal.         Judgment: Judgment normal.         Assessment and Plan    ENCOUNTER DIAGNOSES     ICD-10-CM   1. Encounter for Medicare annual wellness exam  Z00.00   2. 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 Primary hypertension  I10   4. Pain in both feet  M79.671    M79.672  5. Osteoarthritis, unspecified osteoarthritis type, unspecified site  M19.90        On the day of the encounter, a total of 45 minutes was spent on this patient encounter including review of historical information, examination, documentation and post-visit activities. The time documented excludes procedural time.     Medicare wellness visit completed   Forms reviewed   Orders for screening/fasting lab work submitted  12 hour fast requested    HTN:  Stable  Continue with current medication  No changes  Pt will have FLP updated, 12 hour fast     Patient was counseled on cardiovascular diagnosis for 15 minutes which  included medication reconciliation, ROS, lifestyle modifications, nutrition plan recommended and daily physical activity encouraged. Labs reviewed as below.       Lab Results   Component Value Date    TRIG 58 02/19/2021    HDLCHOL 43 (L) 02/19/2021    LDLCHOL 159 (H) 02/19/2021    CHOLESTEROL 214 (H) 02/19/2021         Depression/ anxiety:  Stable  Continue with current medication  No changes  Depression Screening  Sex- female  Age- 83 y.o.  Depressed- no, stable   Sleep- good  Interest- good  Guilt- negative  Energy- good  Concentration- down slightly  Appetite- good, daughter is cooking for her!   Psychomotor- unremarkable  Suicidal- denied by patient  Sex Drive: abnormal  Discussed depression prevention through healthy lifestyle, stress management, social connections, self-care and avoiding substance use.  Instructed patient to reach out to office for any signs or symptoms of depressed mood.   Patient to report to ER for any suicidal or homicidal ideations.   Depression prevention counseling provided for 15 minutes.      B/l foot pain: OA:  Discussed medication  Continue with Mobic  15mg  once daily  Ice/heat  Heating pad discussed  Stretching before getting out of bed  Stay active   If symptoms worsen will refer back to podiatry if needed     Orders Placed This Encounter    CBC/DIFF    Comp Metabolic Panel-Fasting    Lipid Panel    TSH w/ Free T4 Reflex      Follow up: Return in about 6 months (around 12/08/2023).      This patient was seen independently.    Wandra Gustin, PA-C  06/07/2023, 13:40    FAMILY MEDICINE, [facility]  [address]  [place] [address]  Operated by [facility]  Medicare Annual Wellness Visit    Name: Elizabeth Hunter MRN:  [id number]   Date: 06/07/2023 Age: 83 y.o.       SUBJECTIVE:   Elizabeth Hunter is a 83 y.o. female for presenting for Medicare Wellness exam.   I have reviewed and reconciled the medication list with the patient today.        06/07/2023      1:34 PM   Comprehensive Health Assessment-Adult   Do you wish to complete this form? Yes   During the past 4 weeks, how would you rate your health in general? Good   During the past 4 weeks, how much difficulty have you had doing your usual activities inside and outside your home because of medical or emotional problems? No difficulty at all   During the past 4 weeks, was someone available to help you if you needed and wanted help? Yes, as much as I wanted   In the past year, how many  times have you gone to the emergency department or been admitted to a hospital for a health problem? None   Are you generally satisfied with your sleep? Yes   Do you have enough money to buy things you need in everyday life, such as food, clothing, medicines, and housing? Yes, always   Can you get to places beyond walking distance without help?  (For example, can you drive your own car or travel alone on buses)? Yes   Do you fasten your seatbelt when you are in a car? Yes, usually   Do you exercise 20 minutes 3 or more days per week (such as walking, dancing, biking, mowing grass, swimming)? Yes, some of the time   How often do you eat food that is healthy (fruits, vegetables, lean meats) instead of unhealthy (sweets, fast food, junk food, fatty foods)? Some of the time   How often do you have trouble taking medicines the eay you are told to take them? I always take them as prescribed   Do you need any help communicating with your doctors and nurses because of vision or hearing problems? No   During the past 12 months, have you experienced confusion or memory loss that is happening more often or is getting worse? Yes   Do you have one person you think of as your personal doctor (primary care provider or family doctor)? Yes   If you are seeing a Primary Care Provider (PCP) or family doctor. please list their name palmer,Paisyn Guercio   Are you now also seeing any specialist physician(s) (such as eye doctor, foot doctor, skin doctor)? Yes    If you are seeing a specialist for anything such as foot, eye, skin, etc.  please list their name(s) Dr Corder/Psychiatry   How confident are you that you can control or manage most of your health problems? Very confident       I have reviewed and updated as appropriate the past medical, family and social history. 06/07/2023 as summarized below:  Past Medical History:   Diagnosis Date    AAA (abdominal aortic aneurysm)     Back ache     Bowel trouble     obstruction/nonsurgical    Chronic headaches     Constipation     Depression     Fibromyalgia     Incomplete defecation     Osteoarthritis     Rectocele     Splenic artery aneurysm (CMS [facility])      Past Surgical History:   Procedure Laterality Date    Hx appendectomy      Hx cesarean section      Hx cholecystectomy      Hx colonoscopy      Hx lap cholecystectomy  2012    Hx sinus surgery  2014    Hx wisdom teeth extraction       Current Outpatient Medications   Medication Sig    escitalopram oxalate (LEXAPRO) 20 mg Oral Tablet Take 1 Tablet (20 mg total) by mouth Daily    meloxicam  (MOBIC ) 15 mg Oral Tablet TAKE 1 TABLET DAILY    metoprolol  tartrate (LOPRESSOR ) 25 mg Oral Tablet TAKE 1/2 TABLET(12.5 MG) BY MOUTH TWICE DAILY    OLANZapine (ZYPREXA) 5 mg Oral Tablet Take 1.5 Tablets (7.5 mg total) by mouth Every night Increase to 5mg  if tolerated well.     Family Medical History:       Problem Relation (Age of Onset)    Heart Attack Father (13)  Hypertension (High Blood Pressure) Mother (47)            Social History     Socioeconomic History    Marital status: Widowed   Occupational History    Occupation: SELF     Comment: Soil scientist   Tobacco Use    Smoking status: Never    Smokeless tobacco: Never   Vaping Use    Vaping status: Never Used   Substance and Sexual Activity    Alcohol use: No    Drug use: Not Currently     Types: Benzodiazepines    Sexual activity: Not Currently     Comment: did not ask     Social Determinants of Health     Financial Resource Strain:  Low Risk  (06/07/2023)    Financial Resource Strain     SDOH Financial: No   Transportation Needs: Low Risk  (06/07/2023)    Transportation Needs     SDOH Transportation: No   Social Connections: Medium Risk (06/07/2023)    Social Connections     SDOH Social Isolation: 1 or 2 times a week   Intimate Partner Violence: Low Risk  (06/07/2023)    Intimate Partner Violence     SDOH Domestic Violence: I have not had a partner in the past year.   Housing Stability: Low Risk  (06/07/2023)    Housing Stability     SDOH Housing Situation: I have housing.     SDOH Housing Worry: No   Health Literacy: Low Risk  (10/26/2022)    Health Literacy     SDOH Health Literacy: Never   Employment Status: Low Risk  (06/07/2023)    Employment Status     SDOH Employment: Otherwise unemployed but not seeking work (ex. Consulting civil engineer, retired, disabled, unpaid primary care giver)         List of Current Health Care Providers   Care Team       PCP       Name Type Specialty Phone Number    Wende Halo, [place] [facility] [phone]              Care Team       No care team found                      Health Maintenance   Topic Date Due    RSV Adult 60+ or Pregnancy (1 - 1-dose 75+ series) Never done    Influenza Vaccine (Season Ended) 10/03/2023    Medicare Annual Wellness Visit  06/06/2024    Osteoporosis screening  12/23/2032    Adult Tdap-Td  Discontinued    Shingles Vaccine  Discontinued    Covid-19 Vaccine  Discontinued    Pneumococcal Vaccination, Age 39+  Discontinued     Medicare Wellness Assessment   Medicare initial or wellness physical in the last year?: Yes  Advance Directives   Does patient have a living will or MPOA: Yes   Has patient provided [employer] with a copy?: Yes              Activities of Daily Living   Do you need help with dressing, bathing, or walking?: No   Do you need help with shopping, housekeeping, medications, or finances?: No   Do you have rugs in hallways, broken steps, or poor lighting?: No   Do you have grab  bars in your bathroom, non-slip strips in your tub, and hand rails on your stairs?: Yes  Cognitive Function Screen (1=Yes, 0=No)   What is you age?: Correct   What is the time to the nearest hour?: Correct   What is the year?: Correct   What is the name of this clinic?: Correct   Can the patient recognize two persons (the doctor, the nurse, home help, etc.)?: Correct   What is the date of your birth? (day and month sufficient) : Correct   In what year did World War II end?: Correct   Who is the current president of the [place] ?: Correct   Count from 20 down to 1?: Correct   What address did I give you earlier?: Correct   Total Score: 10       Fall Risk Screen   Do you feel unsteady when standing or walking?: No  Do you worry about falling?: No  Have you fallen in the past year?: Yes  How many times have you fallen?: Once  Were you ever injured from falling?: No   Depression Screen     Little interest or pleasure in doing things.: Not at all  Feeling down, depressed, or hopeless: Not at all  PHQ 2 Total: 0     Pain Score   Pain Score:   7    Substance Use-Abuse Screening     Tobacco Use     In Past 12 MONTHS, how often have you used any tobacco product (for example, cigarettes, e-cigarettes, cigars, pipes, or smokeless tobacco)?: Never     Alcohol use     In the PAST 12 MONTHS, how often have you had 5 (men)/4 (women) or more drinks containing alcohol in one day?: Never     Prescription Drug Use     In the PAST 12 months, how often have you used any prescription medications just for the feeling, more than prescribed, or that were not prescribed for you? Prescriptions may include: opioids, benzodiazepines, medications for ADHD: Never           Illicit Drug Use   In the PAST 12 MONTHS, how often have you used any drugs, including marijuana, cocaine or crack, heroin, methamphetamine, hallucinogens, ecstasy/MDMA?: Never            Urine Incontinence Screen   Urinary Incontinence Screen  Do you ever leak urine when  you don't want to?: YES       OBJECTIVE:   BP 108/70 (Site: Left Arm, Patient Position: Sitting, Cuff Size: Adult)   Pulse 80   Temp 36.4 C (97.5 F) (Tympanic)   Resp 12   Ht 1.549 m (5\' 1" )   Wt 68.2 kg (150 lb 6.4 oz)   SpO2 96%   BMI 28.42 kg/m        Other appropriate exam:  See above exam    Health Maintenance Due   Topic Date Due    RSV Adult 60+ or Pregnancy (1 - 1-dose 75+ series) Never done      ASSESSMENT & PLAN:  Problem List Items Addressed This Visit          Psychiatric    Moderate major depression (CMS HCC)    Relevant Orders    CBC/DIFF    Comp Metabolic Panel-Fasting    Lipid Panel    TSH w/ Free T4 Reflex     Other Visit Diagnoses         Encounter for Medicare annual wellness exam    -  Primary    Relevant Orders    CBC/DIFF  Comp Metabolic Panel-Fasting    Lipid Panel    TSH w/ Free T4 Reflex      Primary hypertension        Relevant Orders    CBC/DIFF    Comp Metabolic Panel-Fasting    Lipid Panel    TSH w/ Free T4 Reflex      Pain in both feet          Osteoarthritis, unspecified osteoarthritis type, unspecified site                 Identified Risk Factors/ Recommended Actions     Fall Risk Follow up plan of care: Footwear and potential problems addressed  The PHQ 2 Total: 0 depression screen is interpreted as negative.    Urinary Incontinence Plan of Care: Lifestyle modifications      Orders Placed This Encounter    CBC/DIFF    Comp Metabolic Panel-Fasting    Lipid Panel    TSH w/ Free T4 Reflex          The patient has been educated about risk factors and recommended preventive care. Written Prevention Plan completed/ updated and given to patient (see After Visit Summary).    Return in about 6 months (around 12/08/2023).    Wandra Gustin, PA-C

## 2023-06-07 NOTE — Patient Instructions (Signed)
 Medicare Preventive Services  Medicare coverage information Recommendation for YOU   Heart Disease and Diabetes   Lipid profile Every 5 years or more often if at risk for cardiovascular disease     Lab Results   Component Value Date    CHOLESTEROL 214 (H) 02/19/2021    HDLCHOL 43 (L) 02/19/2021    LDLCHOL 159 (H) 02/19/2021    TRIG 58 02/19/2021         Diabetes Screening    Yearly for those at risk for diabetes, 2 tests per year for those with prediabetes Last Glucose: 123    Diabetes Self Management Training or Medical Nutrition Therapy  For those with diabetes, up to 10 hrs initial training within a year, subsequent years up to 2 hrs of follow up training Optional for those with diabetes     Medical Nutrition Therapy  Three hours of one-on-one counseling in first year, two hours in subsequent years Optional for those with diabetes, kidney disease   Intensive Behavioral Therapy for Obesity  Face-to-face counseling, first month every week, month 2-6 every other week, month 7-12 every month if continued progress is documented Optional for those with Body Mass Index 30 or higher  Your Body mass index is 28.42 kg/m.   Tobacco Cessation (Quitting) Counseling   Covers up to 8 smoking and tobacco-use cessation counseling sessions in a 37-month period.    Optional for those that use tobacco   Cancer Screening Last Completion Date   Colorectal screening   For anyone age 2 to 53 or any age if high risk:  Screening Colonoscopy every 10 yrs if low risk,  more frequent if higher risk  OR  Cologuard Stool DNA test once every 3 years OR  Fecal Occult Blood Testing yearly OR  Flexible  Sigmoidoscopy  every 5 yr OR  CT Colonography every 5 yrs      See below for due date if applicable.   Screening Pap Test   Recommended every 3 years for all women age 52 to 24, or every five years if combined with HPV test (routine screening not needed after total hysterectomy).  Medicare covers every 2 years or yearly if high risk.  Screening  Pelvic Exam   Medicare covers every 2 years, yearly if high risk or childbearing age with abnormal Pap in last 3 yrs.     See below for due date if applicable.   Screening Mammogram   Recommended every 2 years for women age 72 to 28, or more frequent if you have a higher risk. Selectively recommended for women between 40-49 based on shared decisions about risk. Covered by Medicare up to every year for women age 42 or older   See below for due date if applicable.         Lung Cancer Screening  Annual low dose computed tomography (LDCT scan) is recommended for those age 2-80 who smoked 20 pack-years and are current smokers or quit smoking within past 15 years, after counseling by your doctor or nurse clinician about the possible benefits or harms.     See below for due date if applicable.   Vaccinations   Respiratory syncytial virus (RSV)  Age 66 years or older: Based on shared clinical decision-making with your provider.  Pneumococcal Vaccine  Recommended routinely age 52+ with one or two separate vaccines based on your risk. Recommended before age 68 if medical conditions with increased risk  Seasonal Influenza Vaccine  Once every flu season  Hepatitis B Vaccine  3 doses if risk (including anyone with diabetes or liver disease)  Shingles Vaccine  Two doses at age 60 or older  Diphtheria Tetanus Pertussis Vaccine  ONCE as adult, booster every 10 years     Immunization History   Administered Date(s) Administered    Covid-19 Vaccine,Pfizer-BioNTech,Purple Top,52yrs+ 03/15/2019, 04/05/2019    Flucelvax Influenza Vaccine, 6 months + 12/16/2019    High-Dose Influenza Vaccine, 65+ 12/06/2017    Influenza Vaccine, 6 month-adult 10/31/2013    Influenza Vaccine, 65+ 11/15/2018, 11/13/2020, 11/27/2021     Shingles vaccine and Diphtheria Tetanus Pertussis vaccines are available at pharmacies or local health department without a prescription.   Other Preventative Screening  Last Completion Date   Bone Densitometry   Screening:  All females ages 91 and older every 10 years if initial screening normal. Postmenopausal women ages 32-64 need screening with one or more risk factor: previous fracture, parental hip fracture, current smoker, low body weight, excessive alcohol use, Rheumatoid Arthritis   For women with diagnosed Osteoporosis, follow up is recommended every 2 years or a frequency recommended by your provider.     --12/24/2022  See below for due date if applicable.     Glaucoma Screening   Yearly if in high risk group such as diabetes, family history, African American age 72+ or Hispanic American age 21+   See your eye care provider for screening.   Hepatitis C Screening   Recommended  for those born between ages 18-79 years.     See below for due date if applicable.     HIV Testing  Recommended routinely at least ONCE, covered every year for age 6 to 10 regardless of risk, and every year for age over 26 who ask for the test or higher risk. Yearly or up to 3 times in pregnancy         See below for due date if applicable.   Abdominal Aortic Aneurysm Screening Ultrasound   Once with a family history of abdominal aortic aneurysms OR a female between 31-75 and have smoked at least 100 cigarettes in your lifetime.         See below for due date if applicable.       Your Personalized Schedule for Preventive Tests     Health Maintenance: Pending and Last Completed         Date Due Completion Date    RSV Adult 60+ or Pregnancy (1 - 1-dose 75+ series) Never done ---    Influenza Vaccine (Season Ended) 10/03/2023 11/27/2021    Medicare Annual Wellness Visit 06/06/2024 06/07/2023    Override on 11/27/2021: Done    Override on 10/22/2020: Done    Osteoporosis screening 12/23/2032 12/24/2022                  For Information on Advanced Directives for Health Care:  [place]:  [email or web address]  [place], [place], [place], [place] General Information: [email or web address]

## 2023-06-16 ENCOUNTER — Ambulatory Visit (INDEPENDENT_AMBULATORY_CARE_PROVIDER_SITE_OTHER): Payer: Self-pay | Admitting: Physician Assistant

## 2023-06-16 ENCOUNTER — Ambulatory Visit: Attending: NURSE PRACTITIONER

## 2023-06-16 ENCOUNTER — Other Ambulatory Visit: Payer: Self-pay

## 2023-06-16 DIAGNOSIS — F321 Major depressive disorder, single episode, moderate: Secondary | ICD-10-CM | POA: Insufficient documentation

## 2023-06-16 DIAGNOSIS — Z0000 Encounter for general adult medical examination without abnormal findings: Secondary | ICD-10-CM | POA: Insufficient documentation

## 2023-06-16 DIAGNOSIS — R399 Unspecified symptoms and signs involving the genitourinary system: Secondary | ICD-10-CM

## 2023-06-16 DIAGNOSIS — I10 Essential (primary) hypertension: Secondary | ICD-10-CM | POA: Insufficient documentation

## 2023-06-16 DIAGNOSIS — E785 Hyperlipidemia, unspecified: Secondary | ICD-10-CM

## 2023-06-16 LAB — CBC WITH DIFF
BASOPHIL #: <0.1 10*3/uL (ref <=0.20)
BASOPHIL %: 0.8 %
EOSINOPHIL #: 0.14 10*3/uL (ref <=0.50)
EOSINOPHIL %: 1.9 %
HCT: 39.9 % (ref 34.8–46.0)
HGB: 12.8 g/dL (ref 11.5–16.0)
IMMATURE GRANULOCYTE #: <0.1 10*3/uL (ref <0.10)
IMMATURE GRANULOCYTE %: 0.5 % (ref 0.0–1.0)
LYMPHOCYTE #: 1.5 10*3/uL (ref 1.00–4.80)
LYMPHOCYTE %: 20.5 %
MCH: 31.3 pg (ref 26.0–32.0)
MCHC: 32.1 g/dL (ref 31.0–35.5)
MCV: 97.6 fL (ref 78.0–100.0)
MONOCYTE #: 0.5 10*3/uL (ref 0.20–1.10)
MONOCYTE %: 6.8 %
MPV: 10.6 fL (ref 8.7–12.5)
NEUTROPHIL #: 5.07 10*3/uL (ref 1.50–7.70)
NEUTROPHIL %: 69.5 %
PLATELETS: 226 10*3/uL (ref 150–400)
RBC: 4.09 10*6/uL (ref 3.85–5.22)
RDW-CV: 12.3 % (ref 11.5–15.5)
WBC: 7.3 10*3/uL (ref 3.7–11.0)

## 2023-06-16 LAB — COMPREHENSIVE METABOLIC PNL, FASTING
ALBUMIN: 3.7 g/dL (ref 3.4–4.8)
ALKALINE PHOSPHATASE: 57 U/L (ref 55–145)
ALT (SGPT): 18 U/L (ref <31)
ANION GAP: 9 mmol/L (ref 4–13)
AST (SGOT): 21 U/L (ref 11–34)
BILIRUBIN TOTAL: 0.5 mg/dL (ref 0.3–1.3)
BUN/CREA RATIO: 20 (ref 6–22)
BUN: 22 mg/dL (ref 8–25)
CALCIUM: 9.1 mg/dL (ref 8.6–10.3)
CHLORIDE: 109 mmol/L (ref 96–111)
CO2 TOTAL: 24 mmol/L (ref 23–31)
CREATININE: 1.1 mg/dL — ABNORMAL HIGH (ref 0.60–1.05)
ESTIMATED GFR - FEMALE: 50 mL/min/BSA — ABNORMAL LOW (ref >=60)
GLUCOSE: 77 mg/dL (ref 70–99)
POTASSIUM: 4.1 mmol/L (ref 3.5–5.1)
PROTEIN TOTAL: 6.2 g/dL (ref 5.6–7.6)
SODIUM: 142 mmol/L (ref 136–145)

## 2023-06-16 LAB — LIPID PANEL
CHOL/HDL RATIO: 6.3
CHOLESTEROL: 244 mg/dL — ABNORMAL HIGH (ref 100–200)
HDL CHOL: 39 mg/dL — ABNORMAL LOW (ref >=50)
LDL CALC: 184 mg/dL — ABNORMAL HIGH (ref <100)
NON-HDL: 205 mg/dL — ABNORMAL HIGH (ref <=190)
TRIGLYCERIDES: 117 mg/dL (ref <150)
VLDL CALC: 23 mg/dL (ref <30)

## 2023-06-16 LAB — THYROID STIMULATING HORMONE WITH FREE T4 REFLEX: TSH: 2.308 u[IU]/mL (ref 0.350–4.940)

## 2023-06-16 NOTE — Result Encounter Note (Signed)
 Please let the patient know that her lab work has been reviewed. Her LDL has increased with her cholesterol. Tighten up diet. Low fat diet. Consider statin therapy to help lower LDL level. It is very high at this time. Also her kidney function has changed some. Make sure she is staying hydrated. Please ask her to drink a little bit more water daily. Repeat lab work in about 6 weeks, make sure she fast for labs. Orders placed.

## 2023-06-17 NOTE — Result Encounter Note (Signed)
 Left a message for the patient. 06/17/23 tjm

## 2023-07-07 ENCOUNTER — Telehealth (INDEPENDENT_AMBULATORY_CARE_PROVIDER_SITE_OTHER): Payer: Self-pay | Admitting: Physician Assistant

## 2023-07-07 NOTE — Telephone Encounter (Signed)
 Are the labs from 06/16/23? If yes, I have reviewed them and sent a note. It looks like you had to leave a message to inform her about the lab results. Are you able to look back at that message to inform the patient of her lab results?

## 2023-07-07 NOTE — Telephone Encounter (Signed)
 Thank you, if you need anything else please let me know!

## 2023-07-07 NOTE — Telephone Encounter (Signed)
 Ok, thank you

## 2023-07-07 NOTE — Telephone Encounter (Signed)
 Thanks

## 2023-07-07 NOTE — Telephone Encounter (Signed)
 Just spoke with the patient and gave her your previous message again. 07/07/23 tjm

## 2023-07-07 NOTE — Telephone Encounter (Signed)
 Yes I will look back and call her again. 07/07/23 tjm

## 2023-07-07 NOTE — Telephone Encounter (Signed)
 Also spoke with her daughter that called back. I guess Elizabeth Hunter called her upset about the results and I explained to her what was going on. She said we can call her from now on with anything because the patient can't remember what we are telling her. 07/07/23 tjm

## 2023-08-02 ENCOUNTER — Ambulatory Visit (INDEPENDENT_AMBULATORY_CARE_PROVIDER_SITE_OTHER)

## 2023-08-02 ENCOUNTER — Encounter (INDEPENDENT_AMBULATORY_CARE_PROVIDER_SITE_OTHER): Payer: Self-pay | Admitting: Physician Assistant

## 2023-08-02 ENCOUNTER — Other Ambulatory Visit: Payer: Self-pay

## 2023-08-02 ENCOUNTER — Ambulatory Visit: Payer: Self-pay | Attending: Physician Assistant | Admitting: Physician Assistant

## 2023-08-02 VITALS — BP 114/70 | HR 77 | Temp 97.6°F | Resp 12 | Ht 60.0 in | Wt 149.0 lb

## 2023-08-02 DIAGNOSIS — E785 Hyperlipidemia, unspecified: Secondary | ICD-10-CM | POA: Insufficient documentation

## 2023-08-02 DIAGNOSIS — R399 Unspecified symptoms and signs involving the genitourinary system: Secondary | ICD-10-CM

## 2023-08-02 DIAGNOSIS — F411 Generalized anxiety disorder: Secondary | ICD-10-CM | POA: Insufficient documentation

## 2023-08-02 DIAGNOSIS — F321 Major depressive disorder, single episode, moderate: Secondary | ICD-10-CM | POA: Insufficient documentation

## 2023-08-02 DIAGNOSIS — L84 Corns and callosities: Secondary | ICD-10-CM | POA: Insufficient documentation

## 2023-08-02 DIAGNOSIS — F410 Panic disorder [episodic paroxysmal anxiety] without agoraphobia: Secondary | ICD-10-CM | POA: Insufficient documentation

## 2023-08-02 DIAGNOSIS — I10 Essential (primary) hypertension: Secondary | ICD-10-CM | POA: Insufficient documentation

## 2023-08-02 LAB — RENAL FUNCTION PANEL
ALBUMIN: 3.9 g/dL (ref 3.4–4.8)
ANION GAP: 8 mmol/L (ref 4–13)
BUN/CREA RATIO: 21 (ref 6–22)
BUN: 24 mg/dL (ref 8–25)
CALCIUM: 10.4 mg/dL — ABNORMAL HIGH (ref 8.6–10.3)
CHLORIDE: 107 mmol/L (ref 96–111)
CO2 TOTAL: 26 mmol/L (ref 23–31)
CREATININE: 1.13 mg/dL — ABNORMAL HIGH (ref 0.60–1.05)
ESTIMATED GFR - FEMALE: 49 mL/min/BSA — ABNORMAL LOW (ref >=60)
GLUCOSE: 88 mg/dL (ref 65–125)
PHOSPHORUS: 4 mg/dL (ref 2.3–4.0)
POTASSIUM: 4.6 mmol/L (ref 3.5–5.1)
SODIUM: 141 mmol/L (ref 136–145)

## 2023-08-02 LAB — LIPID PANEL
CHOL/HDL RATIO: 5.8
CHOLESTEROL: 262 mg/dL — ABNORMAL HIGH (ref 100–200)
HDL CHOL: 45 mg/dL — ABNORMAL LOW (ref >=50)
LDL CALC: 202 mg/dL — ABNORMAL HIGH (ref <100)
NON-HDL: 217 mg/dL — ABNORMAL HIGH (ref <=190)
TRIGLYCERIDES: 87 mg/dL (ref <150)
VLDL CALC: 18 mg/dL (ref <30)

## 2023-08-02 NOTE — Progress Notes (Signed)
 [facility], [facility]  [address]  [place] [address]  Operated by [facility]     Name: Denzil Bristol  MRN:  [id number]       Date: 08/02/2023  DOB:    1940-05-29  Age: 83 y.o.             Reason for Visit: Follow Up (Repeat labs.)    History of Present Illness  Elizabeth Hunter is a 83 y.o. female who is being seen today with her daughter. Pt's daughter is now living with her. Pt states her memory does continue to change. States she has more issues with word finding at times. States very frustrating during conversations. States she is trying to stay active. Continues to meet with her girlfriends a few times per week. States eating well. Daughter is a Financial risk analyst. Pt states taking medication as directed. Has follow up with psychiatry.   Pt does c/o callus area on her foot. States causing her discomfort    Patient rates their pain a 7 out of 10 on the pain scale. Pt  suffers with chronic low back pain.     Past Medical History:   Diagnosis Date    AAA (abdominal aortic aneurysm)     Back ache     Bowel trouble     obstruction/nonsurgical    Chronic headaches     Constipation     Depression     Fibromyalgia     Incomplete defecation     Osteoarthritis     Rectocele     Splenic artery aneurysm (CMS HCC)          Past Surgical History:   Procedure Laterality Date    HX APPENDECTOMY      HX CESAREAN SECTION      2    HX CHOLECYSTECTOMY      HX COLONOSCOPY      HX LAP CHOLECYSTECTOMY  2012    HX SINUS SURGERY  2014    HX WISDOM TEETH EXTRACTION           Current Outpatient Medications   Medication Sig    escitalopram oxalate (LEXAPRO) 20 mg Oral Tablet Take 1 Tablet (20 mg total) by mouth Daily    meloxicam  (MOBIC ) 15 mg Oral Tablet TAKE 1 TABLET DAILY    metoprolol  tartrate (LOPRESSOR ) 25 mg Oral Tablet TAKE 1/2 TABLET(12.5 MG) BY MOUTH TWICE DAILY    OLANZapine (ZYPREXA) 5 mg Oral Tablet Take 1.5 Tablets (7.5 mg total) by mouth Every night Increase to 5mg  if tolerated well.      Allergies[1]  Family Medical History:       Problem Relation (Age of Onset)    Heart Attack Father (60)    Hypertension (High Blood Pressure) Mother (5)            Social History[2]    Nursing Notes  There are no exam notes on file for this visit.     Review of Systems  Review of Systems   Constitutional:  Negative for chills, fever, malaise/fatigue and weight loss.   HENT:  Negative for congestion, ear discharge, ear pain, hearing loss, nosebleeds, sinus pain and sore throat.    Eyes:  Negative for blurred vision, photophobia and discharge.   Respiratory:  Negative for cough, hemoptysis, sputum production, shortness of breath and wheezing.    Cardiovascular:  Negative for chest pain, palpitations and leg swelling.   Gastrointestinal:  Negative for abdominal pain, blood in  stool, constipation, diarrhea, heartburn, nausea and vomiting.   Genitourinary:  Negative for dysuria, frequency, hematuria and urgency.   Musculoskeletal:  Positive for back pain and joint pain. Negative for falls, myalgias and neck pain.   Skin:  Negative for itching and rash.   Neurological:  Negative for dizziness, tingling, tremors, weakness and headaches.   Endo/Heme/Allergies:  Negative for environmental allergies. Does not bruise/bleed easily.   Psychiatric/Behavioral:  Positive for depression and memory loss. Negative for substance abuse and suicidal ideas. The patient is nervous/anxious. The patient does not have insomnia.         Pt reports at this time symptoms are stable  Taking medication as directed        Physical Exam:  BP 114/70 (Site: Left Arm, Patient Position: Sitting, Cuff Size: Adult)   Pulse 77   Temp 36.4 C (97.6 F) (Tympanic)   Resp 12   Ht 1.524 m (5')   Wt 67.6 kg (149 lb)   SpO2 97%   BMI 29.10 kg/m       Physical Exam  Vitals and nursing note reviewed.   Constitutional:       General: She is not in acute distress.     Appearance: Normal appearance. She is not diaphoretic.   HENT:      Head:  Normocephalic and atraumatic.      Right Ear: External ear normal.      Left Ear: External ear normal.   Eyes:      Conjunctiva/sclera: Conjunctivae normal.      Pupils: Pupils are equal, round, and reactive to light.   Cardiovascular:      Rate and Rhythm: Normal rate and regular rhythm.      Heart sounds: Normal heart sounds.   Pulmonary:      Effort: Pulmonary effort is normal. No respiratory distress.      Breath sounds: Normal breath sounds.   Musculoskeletal:         General: Normal range of motion.   Skin:     General: Skin is warm and dry.      Comments: Callus    Neurological:      Mental Status: She is alert and oriented to person, place, and time.      Coordination: Coordination normal.      Gait: Gait is intact.   Psychiatric:         Mood and Affect: Mood and affect normal.         Cognition and Memory: Memory normal.         Judgment: Judgment normal.         Assessment and Plan    ENCOUNTER DIAGNOSES     ICD-10-CM   1. Hyperlipidemia, unspecified hyperlipidemia type  E78.5   2. Callus of foot  L84   3. 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 Abnormal renal finding  R39.9   5. Primary hypertension  I10   6. Generalized anxiety disorder with panic attacks  F41.1    F41.0        On the day of the encounter, a total of 30 minutes was spent on this patient encounter including review of historical information, examination, documentation and post-visit activities. The time documented excludes procedural time.   Hyperlipidemia:  Repeat lab work today  Patient was counseled on cardiovascular diagnosis for 15 minutes which included medication reconciliation, ROS, lifestyle modifications, nutrition plan recommended and daily physical activity encouraged. Labs reviewed as below.  Lab Results   Component Value Date    TRIG 87 08/02/2023    HDLCHOL 45 (L) 08/02/2023    LDLCHOL 202 (H) 08/02/2023    CHOLESTEROL 262 (H) 08/02/2023       Callus of foot:  Podiatry consult requested     Depression/anxiety/  memory changes:  Continue with current medication  No changes at this time  Keep f/u with psychiatry  Stay active  Balanced diet and exercise    HTN:  Stable  Continue with current medication  No changes     Orders Placed This Encounter    Refer to [facility], [facility]        Follow up: Return in about 3 months (around 11/02/2023), or if symptoms worsen or fail to improve.      This patient was seen independently.    Marien Pan, PA-C  08/02/2023, 14:51         [1] No Known Allergies  [2]   Social History  Tobacco Use    Smoking status: Never    Smokeless tobacco: Never   Vaping Use    Vaping status: Never Used   Substance Use Topics    Alcohol use: No    Drug use: Not Currently     Types: Benzodiazepines

## 2023-08-08 ENCOUNTER — Encounter (INDEPENDENT_AMBULATORY_CARE_PROVIDER_SITE_OTHER): Payer: Self-pay | Admitting: Physician Assistant

## 2023-08-10 ENCOUNTER — Other Ambulatory Visit (INDEPENDENT_AMBULATORY_CARE_PROVIDER_SITE_OTHER): Payer: Self-pay | Admitting: Physician Assistant

## 2023-08-10 ENCOUNTER — Ambulatory Visit (INDEPENDENT_AMBULATORY_CARE_PROVIDER_SITE_OTHER): Payer: Self-pay | Admitting: Physician Assistant

## 2023-08-10 MED ORDER — ROSUVASTATIN 10 MG TABLET
10.0000 mg | ORAL_TABLET | Freq: Every evening | ORAL | 3 refills | Status: AC
Start: 2023-08-10 — End: ?

## 2023-08-10 NOTE — Result Encounter Note (Signed)
 I went ahead and sent Crestor  10mg  to her pharmacy listed. Repeat FLP in about 3 months. Will you pend the lab work for FLP in 3 months?

## 2023-08-10 NOTE — Result Encounter Note (Signed)
 Please let the patient know that lab work has been reviewed lab work looks stable but her LDL has again gone up. Now at 202. We would like below 100. Please ask her if she is agreeable to start medication to help lower LDL. Low fat/ low cholesterol diet is recommended

## 2023-08-11 ENCOUNTER — Other Ambulatory Visit (INDEPENDENT_AMBULATORY_CARE_PROVIDER_SITE_OTHER): Payer: Self-pay | Admitting: Family Medicine

## 2023-08-11 DIAGNOSIS — E785 Hyperlipidemia, unspecified: Secondary | ICD-10-CM

## 2023-08-11 NOTE — Telephone Encounter (Signed)
 Please review message from daughter kim.

## 2023-08-18 ENCOUNTER — Other Ambulatory Visit (INDEPENDENT_AMBULATORY_CARE_PROVIDER_SITE_OTHER): Payer: Self-pay | Admitting: Family Medicine

## 2023-08-18 DIAGNOSIS — R002 Palpitations: Secondary | ICD-10-CM

## 2023-08-18 DIAGNOSIS — I10 Essential (primary) hypertension: Secondary | ICD-10-CM

## 2023-08-18 DIAGNOSIS — R000 Tachycardia, unspecified: Secondary | ICD-10-CM

## 2023-08-19 MED ORDER — METOPROLOL TARTRATE 25 MG TABLET
12.5000 mg | ORAL_TABLET | Freq: Two times a day (BID) | ORAL | 3 refills | Status: AC
Start: 2023-08-19 — End: ?

## 2023-09-02 ENCOUNTER — Telehealth (INDEPENDENT_AMBULATORY_CARE_PROVIDER_SITE_OTHER): Payer: Self-pay

## 2023-09-02 NOTE — Telephone Encounter (Signed)
 Called patient and spoke to daughter Luke. Was asking if they would like to made a f/u since last appt was canceled. Per Luke, she is going to speak with her mother's PCP and get back to us . As for now, she would like to stay with the practice.     Donzell Ora, LPN  02/09/7972 86:88

## 2023-09-11 ENCOUNTER — Other Ambulatory Visit: Payer: Self-pay

## 2023-09-12 ENCOUNTER — Ambulatory Visit: Payer: Self-pay | Attending: Foot & Ankle Surgery | Admitting: Foot & Ankle Surgery

## 2023-09-12 ENCOUNTER — Other Ambulatory Visit: Payer: Self-pay

## 2023-09-12 ENCOUNTER — Encounter (INDEPENDENT_AMBULATORY_CARE_PROVIDER_SITE_OTHER): Payer: Self-pay | Admitting: Foot & Ankle Surgery

## 2023-09-12 DIAGNOSIS — M79672 Pain in left foot: Secondary | ICD-10-CM | POA: Insufficient documentation

## 2023-09-12 NOTE — Progress Notes (Signed)
 [facility], [facility] [address]  [address]  [place] [address]  Operated by [facility]          Name: Elizabeth Hunter MRN:  [id number]   Date: 09/12/2023 Age: 83 y.o.         Chief Complaint:   Chief Complaint   Patient presents with    Foot Pain     Bilateral foot pain       Subjective  Elizabeth Hunter is a 83 y.o. female who presents to clinic for follow-up.  She relates pain in her left heel.  No trauma no swelling no bruising no redness that she is aware of.  No pain with walking and standing pain only when she is in bed      Objective    On physical examination Elizabeth Hunter is seated comfortably in the examination room in no apparent distress.  Alert and oriented to time and place. Mood and affect are normal and appropriate to situation.  She  appears well developed, well nourished, with good attention to hygiene and body habitus.    Skin:  Thin callus along the posterior margin of the left heel no fluctuance no induration no erythema no calor no crepitus.  No other ulcer or preulcerative lesions  Musculoskeletal:  Plantar fat pad atrophy heel margin bilateral.  There is no pain with lateral compression of the calcaneus.  No pain or palpable defect along the Achilles.  No pain as I palpate bone and soft tissue structures left rearfoot heel ankle.  Passive motion ankle joint subtalar joint elicits no pain no crepitus no restriction of motion  Neurological:  Gross sensation intact left foot  Vascular:  Pedal pulses are intact bilateral.  No edema bilateral foot or ankle    Assessment/ Plan  Elizabeth Hunter was seen today for foot pain.    Diagnoses and all orders for this visit:    Pain of left heel  -     Refer to [facility] Podiatry, [facility] 4        1. Pain reported left heel when she is waking up from sleep.  No pain with activity.  No trauma no swelling no bruising no redness reported.  I am not able to elicit pain on clinical exam.  Thin callus left heel too thin for debridement.  Consider  radiographs.  Advised her to offload the heel when at rest when sleeping with pillows.  She verbalized understanding we will try this.  Follow up with me if pain fails to improve    Elizabeth Hunter, DPM        This note was partially created using M*Modal fluency direct system (voice recognition software ) and is inherently subject to errors including those of syntax and "sound- alike substitutions which may escape proofreading.  In such instances, , original meaning may be extrapolated by contextual derivation.

## 2024-01-02 ENCOUNTER — Ambulatory Visit (INDEPENDENT_AMBULATORY_CARE_PROVIDER_SITE_OTHER): Payer: Self-pay | Admitting: Foot & Ankle Surgery
